# Patient Record
Sex: Male | Born: 1970 | Race: White | Hispanic: No | Marital: Single | Smoking: Former smoker
Health system: Southern US, Community
[De-identification: ages and names within clinical notes are randomized; demographics above are authoritative.]

## PROBLEM LIST (undated history)

## (undated) DIAGNOSIS — H9190 Unspecified hearing loss, unspecified ear: Secondary | ICD-10-CM

## (undated) DIAGNOSIS — G4700 Insomnia, unspecified: Secondary | ICD-10-CM

## (undated) DIAGNOSIS — J449 Chronic obstructive pulmonary disease, unspecified: Secondary | ICD-10-CM

## (undated) DIAGNOSIS — N529 Male erectile dysfunction, unspecified: Secondary | ICD-10-CM

## (undated) DIAGNOSIS — R110 Nausea: Secondary | ICD-10-CM

## (undated) DIAGNOSIS — M6289 Other specified disorders of muscle: Secondary | ICD-10-CM

## (undated) DIAGNOSIS — K5904 Chronic idiopathic constipation: Secondary | ICD-10-CM

## (undated) DIAGNOSIS — J4489 Other specified chronic obstructive pulmonary disease: Secondary | ICD-10-CM

## (undated) DIAGNOSIS — E785 Hyperlipidemia, unspecified: Secondary | ICD-10-CM

## (undated) DIAGNOSIS — R7303 Prediabetes: Secondary | ICD-10-CM

## (undated) DIAGNOSIS — J45909 Unspecified asthma, uncomplicated: Secondary | ICD-10-CM

## (undated) DIAGNOSIS — J439 Emphysema, unspecified: Secondary | ICD-10-CM

## (undated) DIAGNOSIS — K649 Unspecified hemorrhoids: Secondary | ICD-10-CM

## (undated) DIAGNOSIS — K861 Other chronic pancreatitis: Secondary | ICD-10-CM

## (undated) DIAGNOSIS — Z9284 Personal history of unintended awareness under general anesthesia: Secondary | ICD-10-CM

## (undated) DIAGNOSIS — M1990 Unspecified osteoarthritis, unspecified site: Secondary | ICD-10-CM

## (undated) DIAGNOSIS — F4310 Post-traumatic stress disorder, unspecified: Secondary | ICD-10-CM

## (undated) DIAGNOSIS — J302 Other seasonal allergic rhinitis: Secondary | ICD-10-CM

## (undated) DIAGNOSIS — K8689 Other specified diseases of pancreas: Secondary | ICD-10-CM

## (undated) DIAGNOSIS — E559 Vitamin D deficiency, unspecified: Secondary | ICD-10-CM

## (undated) HISTORY — DX: Insomnia, unspecified: G47.00

## (undated) HISTORY — DX: Nausea: R11.0

## (undated) HISTORY — DX: Unspecified hemorrhoids: K64.9

## (undated) HISTORY — DX: Chronic idiopathic constipation: K59.04

## (undated) HISTORY — PX: PANCREAS SURGERY: SHX731

## (undated) HISTORY — DX: Male erectile dysfunction, unspecified: N52.9

## (undated) HISTORY — DX: Unspecified hearing loss, unspecified ear: H91.90

## (undated) HISTORY — DX: Hyperlipidemia, unspecified: E78.5

## (undated) HISTORY — DX: Chronic obstructive pulmonary disease, unspecified: J44.9

## (undated) HISTORY — DX: Vitamin D deficiency, unspecified: E55.9

## (undated) HISTORY — DX: Other seasonal allergic rhinitis: J30.2

## (undated) HISTORY — DX: Other chronic pancreatitis: K86.1

## (undated) HISTORY — PX: EYE SURGERY: SHX253

---

## 2003-12-29 DIAGNOSIS — K859 Acute pancreatitis without necrosis or infection, unspecified: Secondary | ICD-10-CM | POA: Insufficient documentation

## 2003-12-29 DIAGNOSIS — R109 Unspecified abdominal pain: Secondary | ICD-10-CM | POA: Insufficient documentation

## 2004-10-13 ENCOUNTER — Emergency Department: Payer: Self-pay | Admitting: Unknown Physician Specialty

## 2004-10-15 ENCOUNTER — Other Ambulatory Visit: Payer: Self-pay

## 2004-10-15 ENCOUNTER — Emergency Department: Payer: Self-pay | Admitting: Emergency Medicine

## 2005-03-11 ENCOUNTER — Emergency Department: Payer: Self-pay | Admitting: Emergency Medicine

## 2005-03-17 ENCOUNTER — Emergency Department: Payer: Self-pay | Admitting: Emergency Medicine

## 2005-12-28 DIAGNOSIS — M629 Disorder of muscle, unspecified: Secondary | ICD-10-CM | POA: Insufficient documentation

## 2006-07-21 ENCOUNTER — Emergency Department: Payer: Self-pay | Admitting: Emergency Medicine

## 2006-12-22 ENCOUNTER — Emergency Department: Payer: Self-pay | Admitting: Emergency Medicine

## 2007-01-14 ENCOUNTER — Ambulatory Visit: Payer: Self-pay | Admitting: Gastroenterology

## 2007-01-21 ENCOUNTER — Ambulatory Visit: Payer: Self-pay | Admitting: Gastroenterology

## 2007-07-09 ENCOUNTER — Inpatient Hospital Stay: Payer: Self-pay | Admitting: Unknown Physician Specialty

## 2008-01-24 ENCOUNTER — Ambulatory Visit: Payer: Self-pay | Admitting: Unknown Physician Specialty

## 2008-02-18 ENCOUNTER — Emergency Department: Payer: Self-pay | Admitting: Emergency Medicine

## 2008-02-29 ENCOUNTER — Ambulatory Visit: Payer: Self-pay | Admitting: Unknown Physician Specialty

## 2008-03-24 ENCOUNTER — Emergency Department: Payer: Self-pay | Admitting: Emergency Medicine

## 2008-03-24 ENCOUNTER — Other Ambulatory Visit: Payer: Self-pay

## 2008-06-29 ENCOUNTER — Other Ambulatory Visit: Payer: Self-pay

## 2008-06-29 ENCOUNTER — Inpatient Hospital Stay: Payer: Self-pay | Admitting: Internal Medicine

## 2008-07-11 IMAGING — US ABDOMEN ULTRASOUND
1 series · 14 of 25 positions shown · non-contrast
Comparison: none

REASON FOR EXAM: Abdominal pain. Patient in [HOSPITAL]
COMMENTS:

[Series 1: abdomen ultrasound · 0.31mm/px · 14 of 80 slices shown]
[im 1/80]
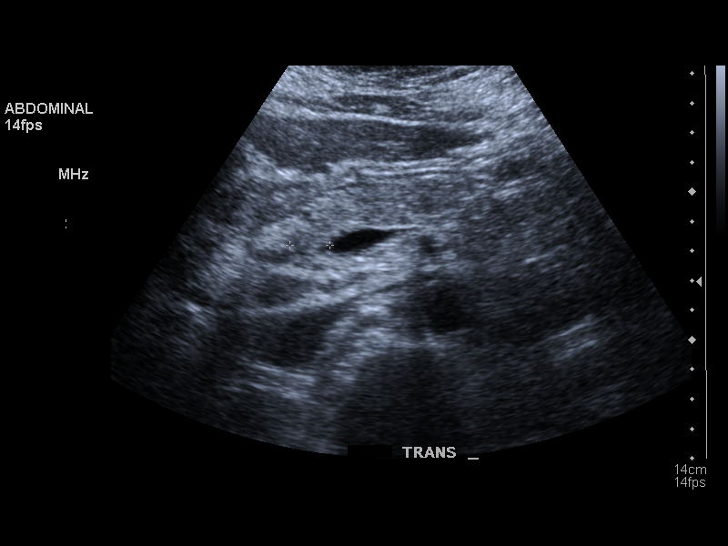
[im 7/80]
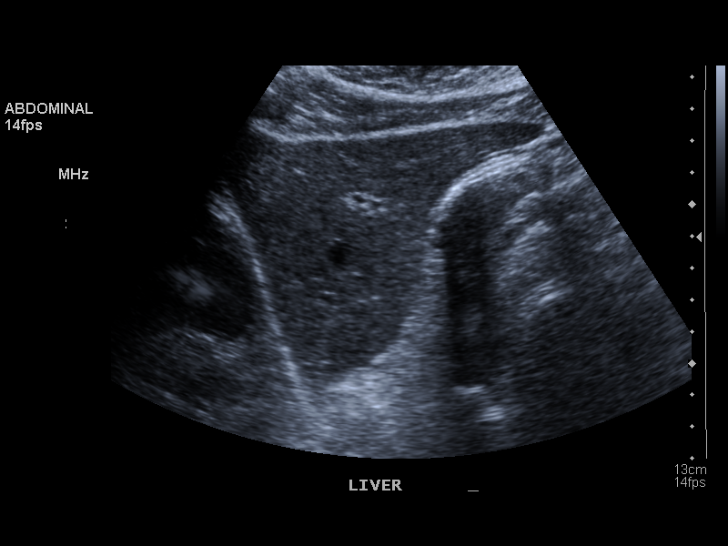
[im 14/80]
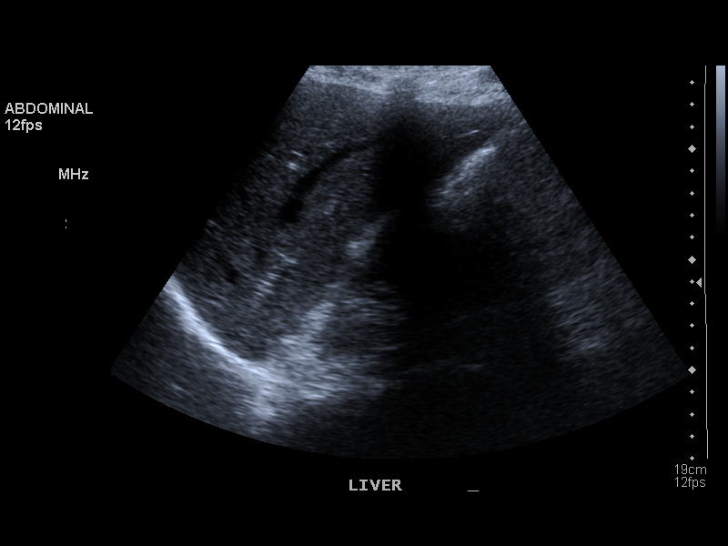
[im 20/80]
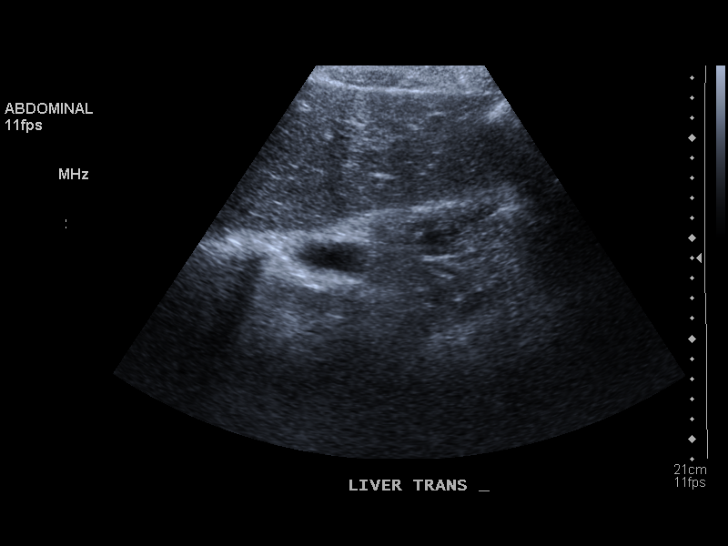
[im 27/80]
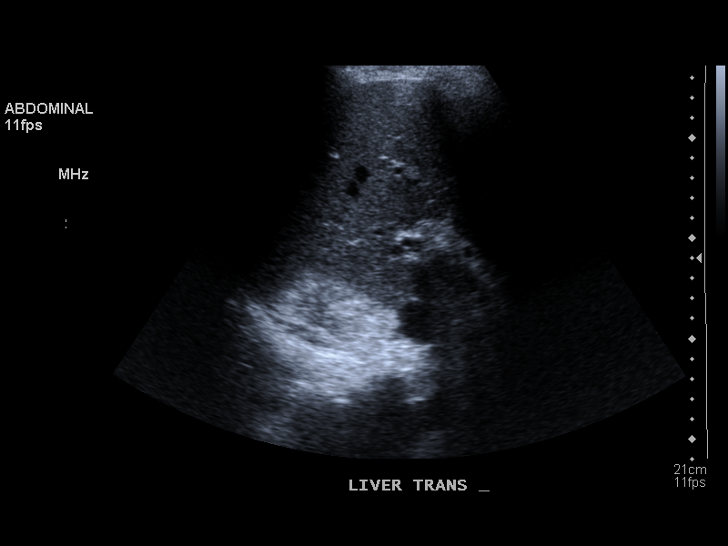
[im 30/80]
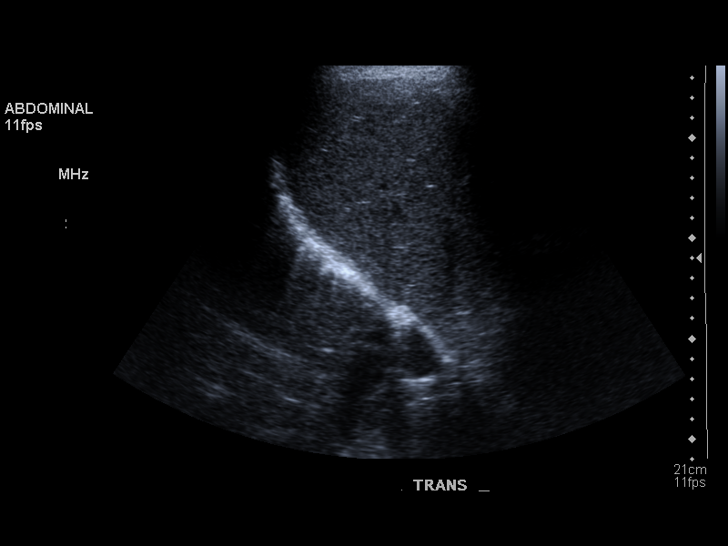
[im 37/80]
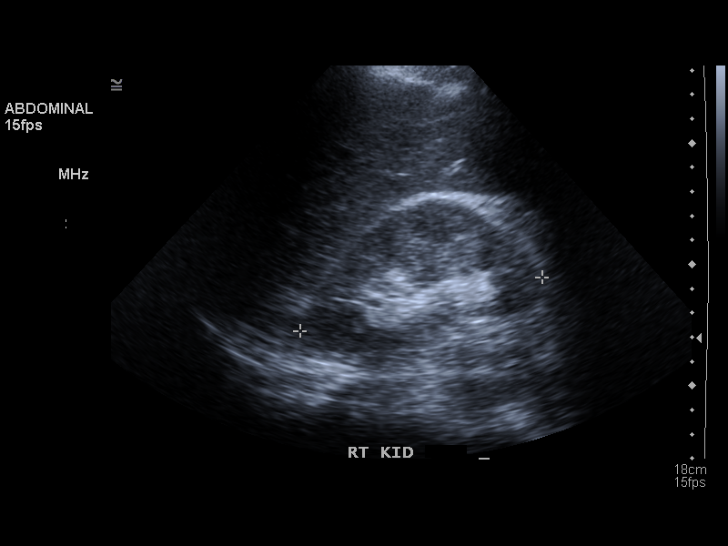
[im 43/80]
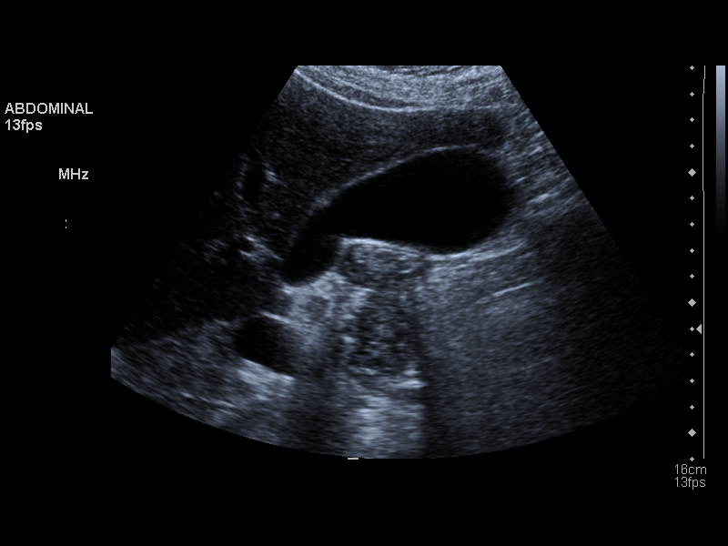
[im 50/80]
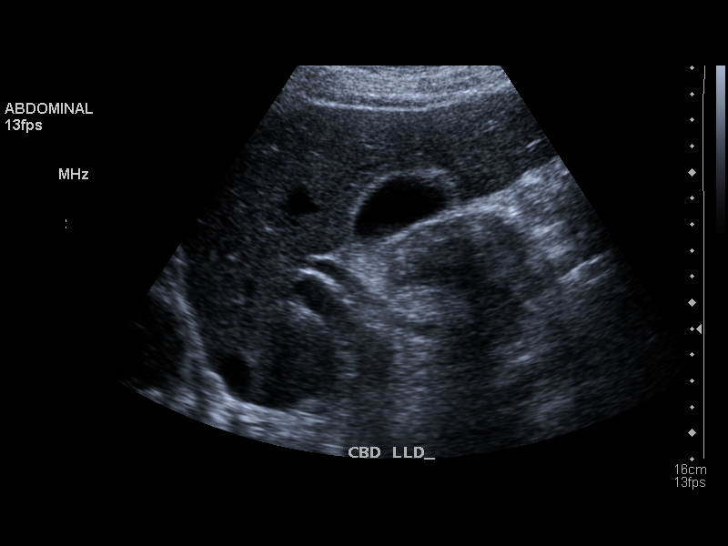
[im 53/80]
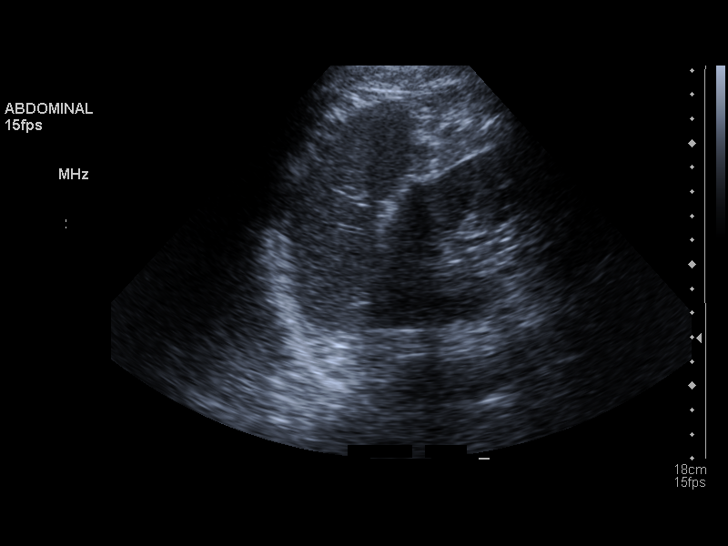
[im 60/80]
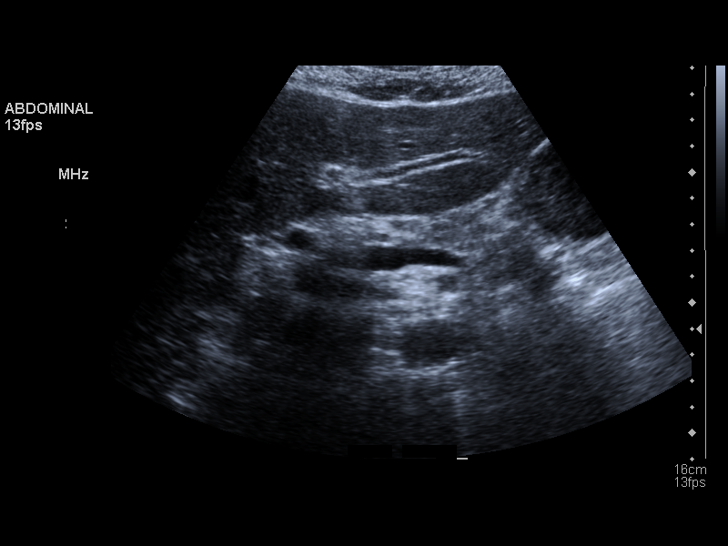
[im 66/80]
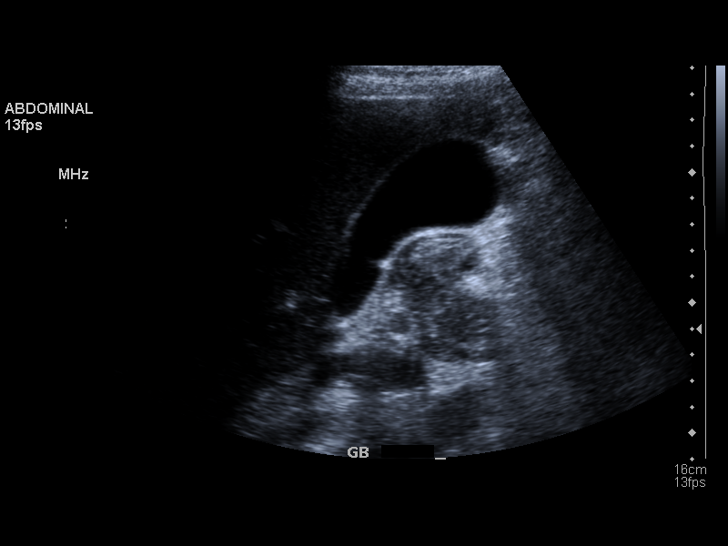
[im 73/80]
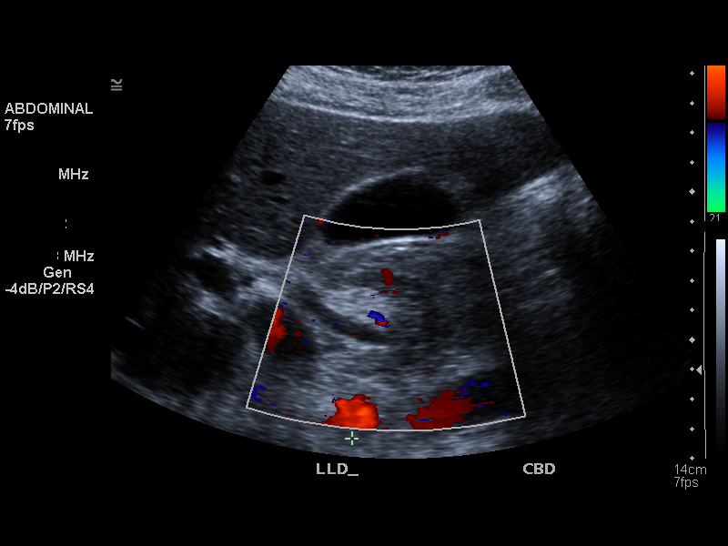
[im 80/80]
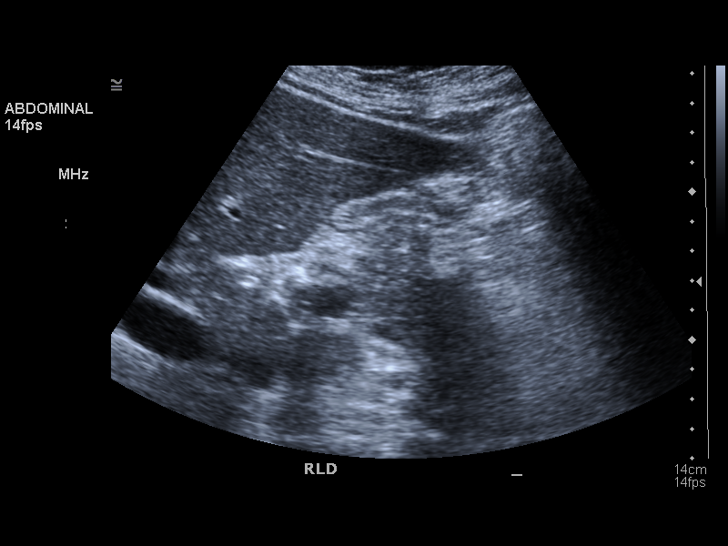

[14 of 25 positions shown; findings below may reference images not displayed]

PROCEDURE:     US  - US ABDOMEN GENERAL SURVEY  - July 21, 2006  [DATE]

RESULT:     The liver, spleen and pancreas show no specific abnormalities.
No gallstones are seen.  There is no thickening of the gallbladder wall.
The common bile duct measures 7.5 mm at maximum diameter.  This is larger
than typically seen for the patient's age.  No stone in the common duct is
seen.  Nonetheless if the patient has laboratory values suggestive of common
duct obstruction then further evaluation by biliary nuclide scan would be
recommended.  The kidneys show no hydronephrosis.  There is no free fluid
observed in the pelvis.  The abdominal aorta is normal in appearance.
IMPRESSION: Normal study except for prominence of the common duct.  If the patient has
laboratory values suggestive of common duct obstruction then further
evaluation by biliary nuclide scan might be helpful.

## 2008-07-14 ENCOUNTER — Other Ambulatory Visit: Payer: Self-pay

## 2008-07-14 ENCOUNTER — Inpatient Hospital Stay: Payer: Self-pay | Admitting: *Deleted

## 2008-11-27 ENCOUNTER — Ambulatory Visit: Payer: Self-pay | Admitting: Pain Medicine

## 2009-06-30 IMAGING — CT CT ABD-PELV W/ CM
1 of 2 series · 15 of 32 positions shown, 19 images · non-contrast
Comparison: none

REASON FOR EXAM: (1) abdominal pain; (2) abdominal pain
COMMENTS:

[Series 2: abdomen · axial · 0.70mm/px · z∈[-515,-99]mm · 15 of 58 slices shown, 19 images]
[im 3/58  soft-tissue]
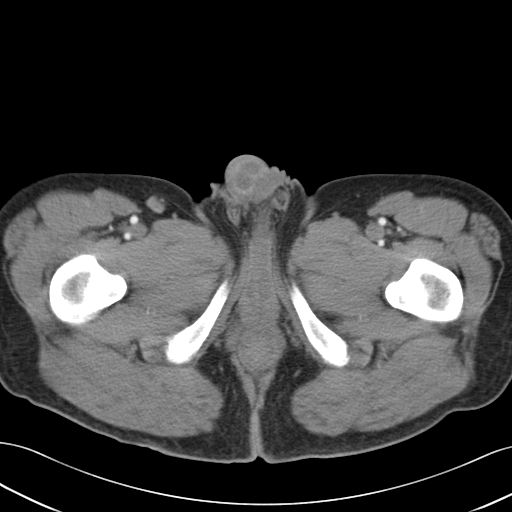
[im 3/58  bone]
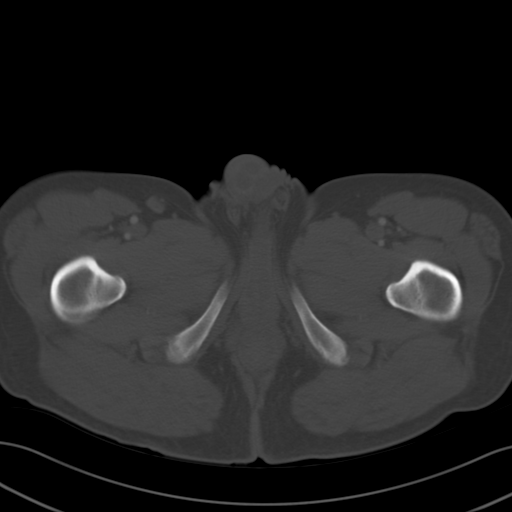
[im 7/58  soft-tissue]
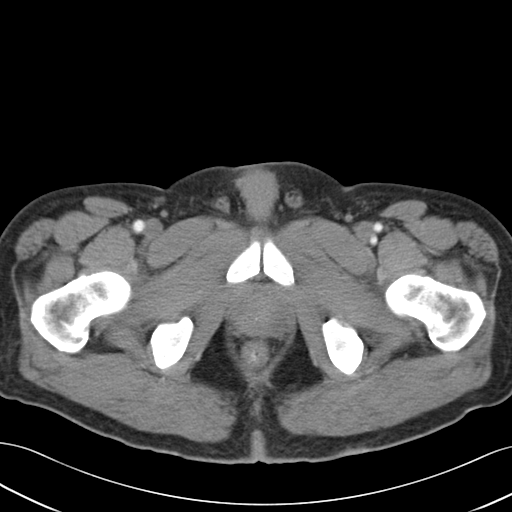
[im 11/58  soft-tissue]
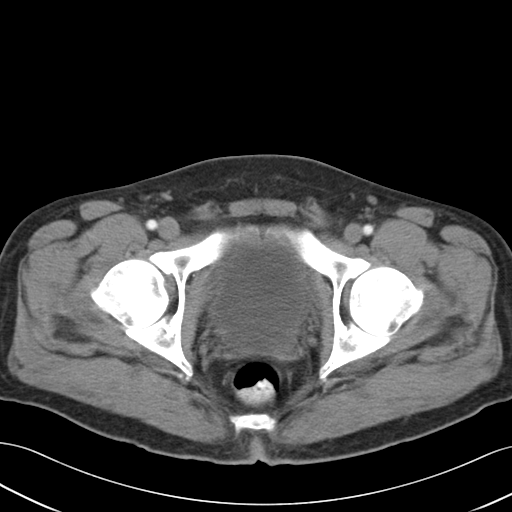
[im 16/58  soft-tissue]
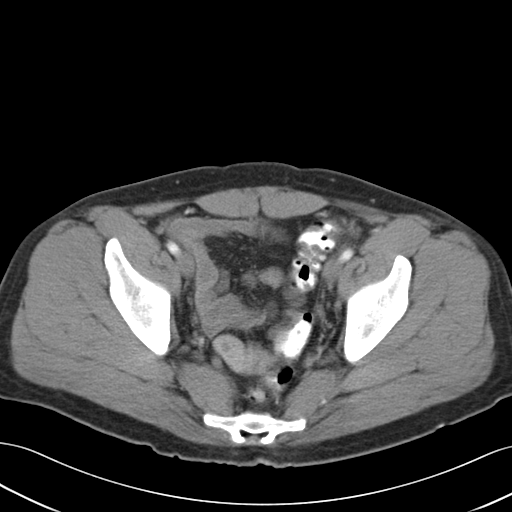
[im 20/58  soft-tissue]
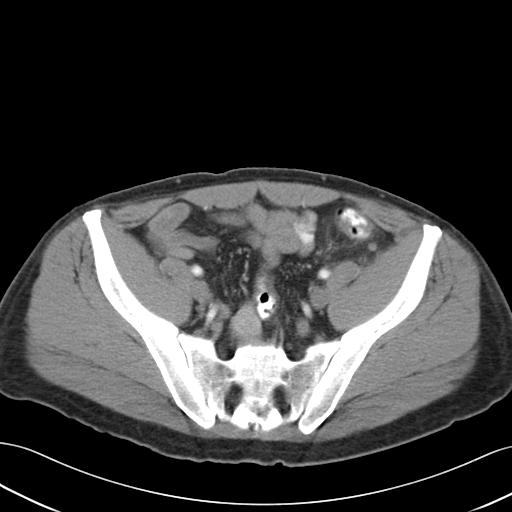
[im 25/58  soft-tissue]
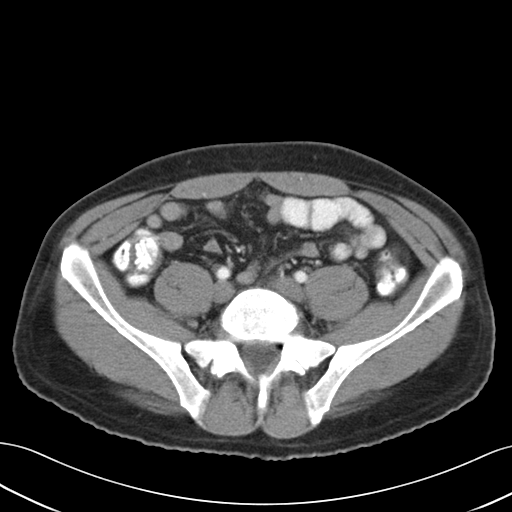
[im 29/58  soft-tissue]
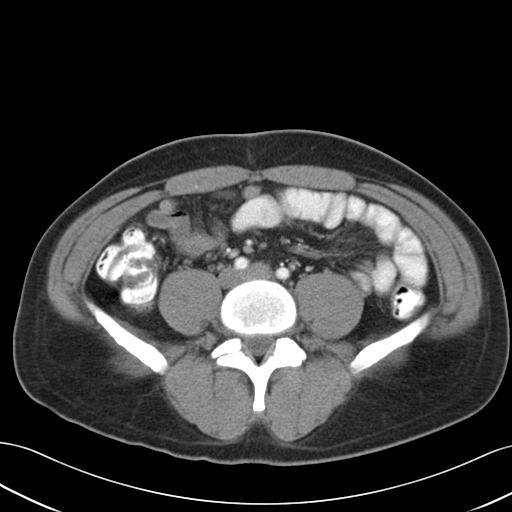
[im 33/58  soft-tissue]
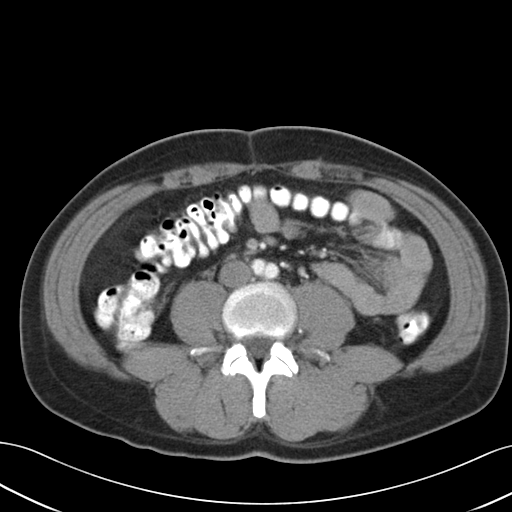
[im 38/58  soft-tissue]
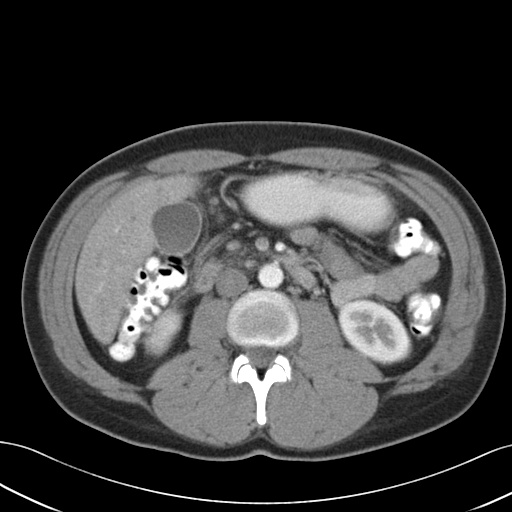
[im 38/58  bone]
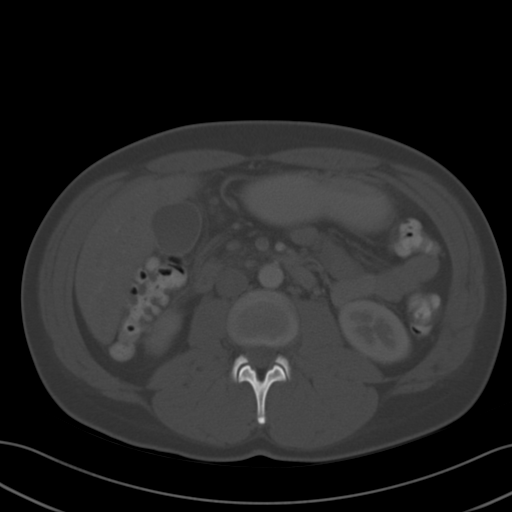
[im 42/58  soft-tissue]
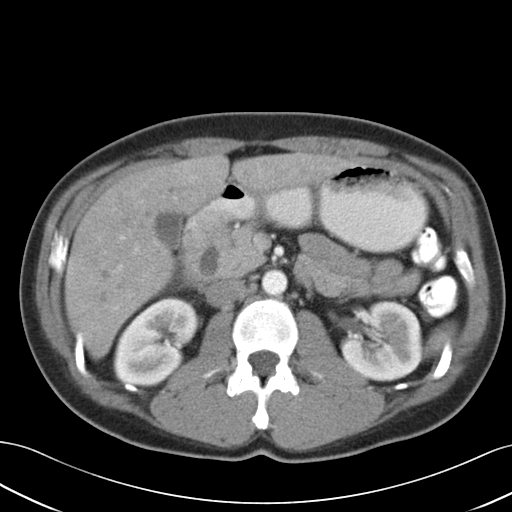
[im 47/58  soft-tissue]
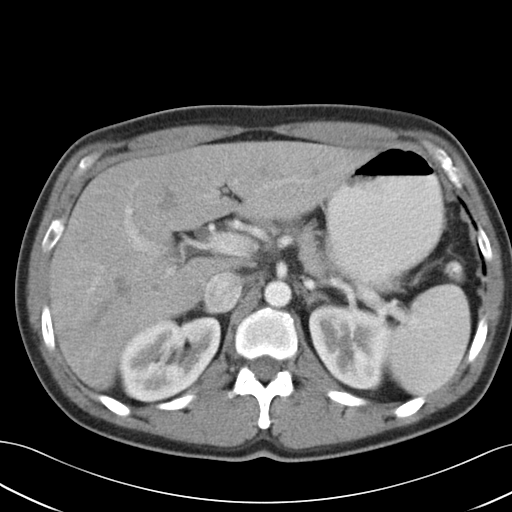
[im 49/58  lung]
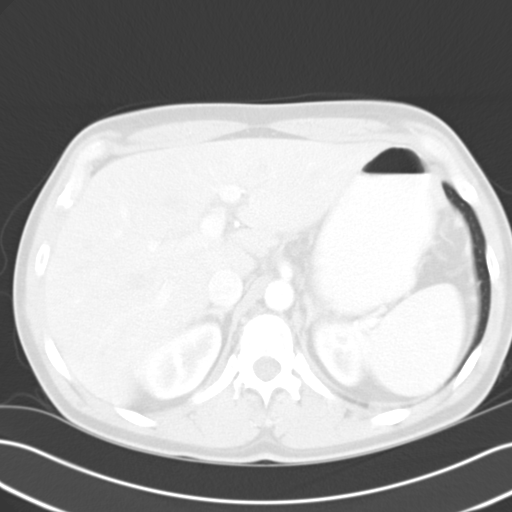
[im 51/58  soft-tissue]
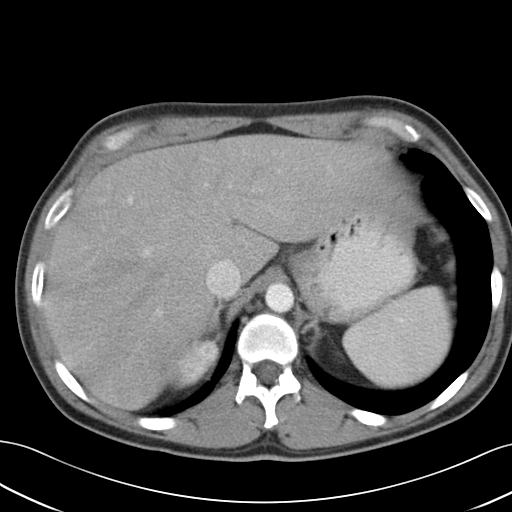
[im 51/58  lung]
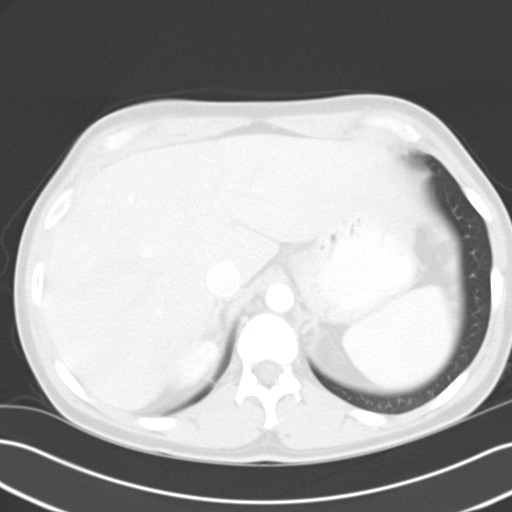
[im 53/58  lung]
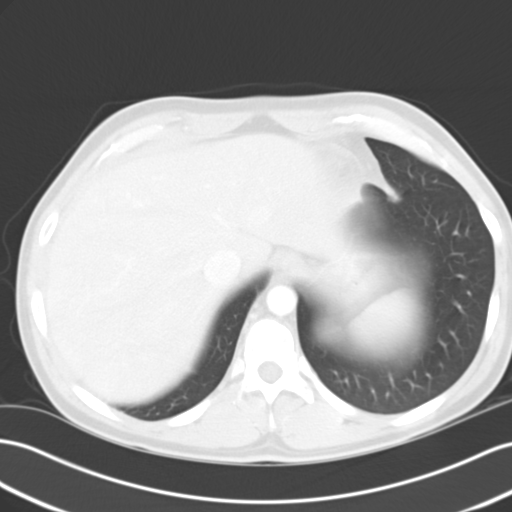
[im 55/58  soft-tissue]
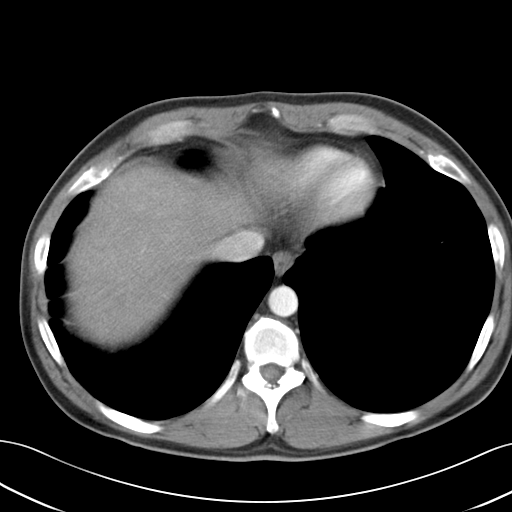
[im 55/58  lung]
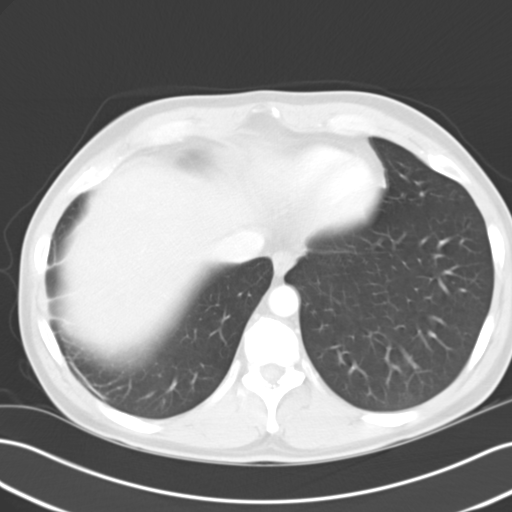

[15 of 32 positions shown; findings below may reference images not displayed]

PROCEDURE:     CT  - CT ABDOMEN / PELVIS  W  - July 10, 2007  [DATE]

RESULT:     CT of the abdomen and pelvis is performed and compared to the
prior similar study of 01/21/2007.

The appendix appears normal. There is no abnormal bowel distention or bowel
wall thickening. The liver, spleen, and kidneys appear to enhance normally.
The abdominal aorta is normal. The common bile duct diameter in the area
pancreatic head is approximately 10 mm. Correlation with LFTs is
recommended. MRCP or ERCP may be beneficial for further investigation. No
intrahepatic biliary ductal dilatation is evident. No radiopaque stones are
evident within the gallbladder. A discrete pancreatic mass is not evident.
In comparison to the prior study there does not appear to be significant
change in the appearance of the common duct in the pancreatic head.

The lung bases are clear. The heart appears to be normal in size.
IMPRESSION: 1. Prominence of the common bile duct in the pancreatic head. Consideration
for MRCP is recommended. ERCP could also be performed.
2. No evidence of appendicitis or diverticulitis.
3. No adenopathy. The kidneys show no evidence of obstruction.

## 2009-12-03 ENCOUNTER — Emergency Department: Payer: Self-pay | Admitting: Emergency Medicine

## 2013-08-18 ENCOUNTER — Ambulatory Visit: Payer: Self-pay | Admitting: Family Medicine

## 2013-09-25 ENCOUNTER — Ambulatory Visit: Payer: Self-pay | Admitting: Physician Assistant

## 2013-09-25 LAB — CBC WITH DIFFERENTIAL/PLATELET
Basophil #: 0.1 10*3/uL (ref 0.0–0.1)
Eosinophil #: 0.2 10*3/uL (ref 0.0–0.7)
Eosinophil %: 2.4 %
HCT: 47.6 % (ref 40.0–52.0)
HGB: 15.6 g/dL (ref 13.0–18.0)
Lymphocyte #: 2.6 10*3/uL (ref 1.0–3.6)
MCV: 89 fL (ref 80–100)
Monocyte #: 0.6 x10 3/mm (ref 0.2–1.0)
Monocyte %: 6.1 %
Neutrophil #: 6 10*3/uL (ref 1.4–6.5)
Neutrophil %: 63.1 %
Platelet: 264 10*3/uL (ref 150–440)
RBC: 5.34 10*6/uL (ref 4.40–5.90)
RDW: 13.9 % (ref 11.5–14.5)

## 2014-07-25 ENCOUNTER — Ambulatory Visit: Payer: Self-pay | Admitting: Physician Assistant

## 2014-12-31 ENCOUNTER — Ambulatory Visit: Payer: Self-pay | Admitting: Family Medicine

## 2015-08-31 ENCOUNTER — Ambulatory Visit
Admission: EM | Admit: 2015-08-31 | Discharge: 2015-08-31 | Disposition: A | Discharge status: Home / Self Care | Payer: Medicare Other | Attending: Internal Medicine | Admitting: Internal Medicine

## 2015-08-31 ENCOUNTER — Encounter: Payer: Self-pay | Admitting: Gynecology

## 2015-08-31 DIAGNOSIS — J019 Acute sinusitis, unspecified: Secondary | ICD-10-CM | POA: Diagnosis not present

## 2015-08-31 HISTORY — DX: Other specified disorders of muscle: M62.89

## 2015-08-31 HISTORY — DX: Other specified diseases of pancreas: K86.89

## 2015-08-31 MED ORDER — PREDNISONE 50 MG PO TABS: 50.0000 mg | ORAL_TABLET | Freq: Every day | ORAL | Status: DC

## 2015-08-31 MED ORDER — CLINDAMYCIN HCL 300 MG PO CAPS: 300.0000 mg | ORAL_CAPSULE | Freq: Two times a day (BID) | ORAL | Status: DC

## 2015-08-31 NOTE — Discharge Instructions (Signed)
Prescriptions for prednisone (a steroid) and clindamycin (an antibiotic) were sent to the [facility] in [place]. Recheck or followup with pcp/[facility] if not starting to improve in a few days. Anticipate gradual improvement in congestion/headache/cough/drainage over the next 2-3 weeks.

## 2015-08-31 NOTE — ED Notes (Signed)
Head, ear, eye, jaw, nose pain.  Lots of pressure.

## 2015-08-31 NOTE — ED Provider Notes (Signed)
CSN: [id number]     Arrival date & time 08/31/15  1006 History   First MD Initiated Contact with Patient 08/31/15 1101     Chief Complaint  Patient presents with  . Facial Pain   HPI  Patient is a 44 year old gentleman with 2 weeks history of increasing sinus congestion, particularly feeling pressure around the eyes, and across the cheeks. Nose is clogged up, having some terrific headaches. Some postnasal drainage, some runny nose, most noticeable in the morning. Using Flonase at bedtime. Having some gum discomfort. Was having some cough and chest tightness earlier in the course of illness, which responded to a couple of doses of an Alka-Seltzer product. Now in the last 3 days having worsening sinus pressure. Symptoms tend to recur in the spring and in the fall.  Past Medical History  Diagnosis Date  . Pancreas (digestive gland) works poorly   . Degenerative disorder of muscle    Past Surgical History  Procedure Laterality Date  . Pancreas surgery     History reviewed. No pertinent family history. Social History  Substance Use Topics  . Smoking status: Former Smoker    Types: Cigarettes  . Smokeless tobacco: Current User  . Alcohol Use: No    Review of Systems  All other systems reviewed and are negative.   Allergies  Penicillins and Zithromax  Home Medications   Prior to Admission medications   Medication Sig Start Date End Date Taking? Authorizing Provider  fluticasone (FLONASE) 50 MCG/ACT nasal spray Place 1 spray into both nostrils daily.   Yes Historical Provider, MD  clindamycin (CLEOCIN) 300 MG capsule Take 1 capsule (300 mg total) by mouth 2 (two) times daily. 08/31/15   Eustace Moore, MD  predniSONE (DELTASONE) 50 MG tablet Take 1 tablet (50 mg total) by mouth daily. 08/31/15   Eustace Moore, MD   Meds Ordered and Administered this Visit  Medications - No data to display  BP 114/74 mmHg  Pulse 103  Temp(Src) 98 F (36.7 C) (Oral)  Ht 5\' 11"  (1.803 m)  Wt 246  lb (111.585 kg)  BMI 34.33 kg/m2  SpO2 96% No data found.   Physical Exam  Constitutional: He is oriented to person, place, and time. No distress.  Alert, nicely groomed  HENT:  Head: Atraumatic.  Bilateral ears are little bit waxy, most pronounced on the right. TMs are moderately dull, flushed pink on the left and red on the right. Severe nasal congestion observed Throat is quite red  Eyes:  Conjugate gaze, no eye redness/drainage  Neck: Neck supple.  Cardiovascular: Normal rate and regular rhythm.   Pulmonary/Chest: No respiratory distress. He has no wheezes. He has no rales.  Lungs clear, symmetric breath sounds Breath sounds are quite diminished posteriorly  Abdominal: Soft. He exhibits no distension.  Musculoskeletal: He exhibits no edema.  Neurological: He is alert and oriented to person, place, and time.  Skin: Skin is warm and dry.  Pink. No cyanosis  Nursing note and vitals reviewed.   ED Course  Procedures none        MDM   1. Acute sinusitis, recurrence not specified, unspecified location    Discharge Medication List as of 08/31/2015 11:16 AM    START taking these medications   Details  clindamycin (CLEOCIN) 300 MG capsule Take 1 capsule (300 mg total) by mouth 2 (two) times daily., Starting 08/31/2015, Until Discontinued, Normal    predniSONE (DELTASONE) 50 MG tablet Take 1 tablet (50 mg total) by  mouth daily., Starting 08/31/2015, Until Discontinued, Normal       Recheck or follow-up PCP/[facility], if not improving in a few days. Anticipate gradual improvement in sinus pressure, headache, drainage, over the next 2-3 weeks.    Eustace Moore, MD 08/31/15 1125

## 2015-10-26 ENCOUNTER — Ambulatory Visit
Admission: EM | Admit: 2015-10-26 | Discharge: 2015-10-26 | Disposition: A | Discharge status: Home / Self Care | Payer: Medicare Other | Attending: Family Medicine | Admitting: Family Medicine

## 2015-10-26 DIAGNOSIS — K047 Periapical abscess without sinus: Secondary | ICD-10-CM

## 2015-10-26 HISTORY — DX: Unspecified asthma, uncomplicated: J45.909

## 2015-10-26 MED ORDER — CLINDAMYCIN HCL 150 MG PO CAPS: 450.0000 mg | ORAL_CAPSULE | Freq: Three times a day (TID) | ORAL | Status: AC

## 2015-10-26 NOTE — ED Notes (Signed)
Pt c/o teeth on the top and bottom of the right side of mouth hurting..states he has an apt to extract them this week but the pain is worse, states he is on OxyContin and morphine for chronic pain which is not relieving the tooth pain.

## 2015-10-26 NOTE — Discharge Instructions (Signed)
Use pain medications you have at home for pain along with warm compresses or ICE Complete all antibiotics as directed Keep appt with [facility] on Monday as planned Dental Abscess A dental abscess is a collection of pus in or around a tooth. CAUSES This condition is caused by a bacterial infection around the root of the tooth that involves the inner part of the tooth (pulp). It may result from:  Severe tooth decay.  Trauma to the tooth that allows bacteria to enter into the pulp, such as a broken or chipped tooth.  Severe gum disease around a tooth. SYMPTOMS Symptoms of this condition include:  Severe pain in and around the infected tooth.  Swelling and redness around the infected tooth, in the mouth, or in the face.  Tenderness.  Pus drainage.  Bad breath.  Bitter taste in the mouth.  Difficulty swallowing.  Difficulty opening the mouth.  Nausea.  Vomiting.  Chills.  Swollen neck glands.  Fever. DIAGNOSIS This condition is diagnosed with examination of the infected tooth. During the exam, your dentist may tap on the infected tooth. Your dentist will also ask about your medical and dental history and may order X-rays. TREATMENT This condition is treated by eliminating the infection. This may be done with:  Antibiotic medicine.  A root canal. This may be performed to save the tooth.  Pulling (extracting) the tooth. This may also involve draining the abscess. This is done if the tooth cannot be saved. HOME CARE INSTRUCTIONS  Take medicines only as directed by your dentist.  If you were prescribed antibiotic medicine, finish all of it even if you start to feel better.  Rinse your mouth (gargle) often with salt water to relieve pain or swelling.  Do not drive or operate heavy machinery while taking pain medicine.  Do not apply heat to the outside of your mouth.  Keep all follow-up visits as directed by your dentist. This is important. SEEK MEDICAL CARE  IF:  Your pain is worse and is not helped by medicine. SEEK IMMEDIATE MEDICAL CARE IF:  You have a fever or chills.  Your symptoms suddenly get worse.  You have a very bad headache.  You have problems breathing or swallowing.  You have trouble opening your mouth.  You have swelling in your neck or around your eye.   This information is not intended to replace advice given to you by your health care provider. Make sure you discuss any questions you have with your health care provider.   Document Released: 12/14/2005 Document Revised: 04/30/2015 Document Reviewed: 12/11/2014 Elsevier Interactive Patient Education [employer]2016 Elsevier Inc.

## 2015-10-26 NOTE — ED Provider Notes (Signed)
CSN: [id number]     Arrival date & time 10/26/15  1143 History   First MD Initiated Contact with Patient 10/26/15 1333     Chief Complaint  Patient presents with  . Dental Pain   (Consider location/radiation/quality/duration/timing/severity/associated sxs/prior Treatment) HPI  Past Medical History  Diagnosis Date  . Pancreas (digestive gland) works poorly (HCC)   . Degenerative disorder of muscle   . Asthma    Past Surgical History  Procedure Laterality Date  . Pancreas surgery     No family history on file. Social History  Substance Use Topics  . Smoking status: Former Smoker    Types: Cigarettes  . Smokeless tobacco: Current User  . Alcohol Use: No    Review of Systems  Constitutional: Positive for fever, chills, activity change and appetite change. Negative for diaphoresis.  HENT: Positive for dental problem, facial swelling, sinus pressure and sneezing. Negative for drooling, ear discharge, ear pain, hearing loss, mouth sores, nosebleeds, postnasal drip, rhinorrhea, sore throat, tinnitus, trouble swallowing and voice change.   Eyes: Negative.   Respiratory: Negative.   Cardiovascular: Negative.   Gastrointestinal: Negative.   Genitourinary: Negative.   Musculoskeletal: Negative.   Skin: Negative.   Neurological: Negative.   Psychiatric/Behavioral: Negative.     Allergies  Penicillins and Zithromax  Home Medications   Prior to Admission medications   Medication Sig Start Date End Date Taking? Authorizing Provider  albuterol (PROVENTIL) (2.5 MG/3ML) 0.083% nebulizer solution Take 2.5 mg by nebulization every 6 (six) hours as needed for wheezing or shortness of breath.   Yes Historical Provider, MD  Fluticasone-Salmeterol (ADVAIR) 250-50 MCG/DOSE AEPB Inhale 1 puff into the lungs 2 (two) times daily.   Yes Historical Provider, MD  HYDROmorphone (DILAUDID) 2 MG tablet Take 2 mg by mouth 3 (three) times daily as needed for severe pain.   Yes Historical Provider, MD   oxyCODONE (OXYCONTIN) 20 mg 12 hr tablet Take 20 mg by mouth every 12 (twelve) hours.   Yes Historical Provider, MD  tiotropium (SPIRIVA) 18 MCG inhalation capsule Place 18 mcg into inhaler and inhale daily.   Yes Historical Provider, MD  clindamycin (CLEOCIN) 300 MG capsule Take 1 capsule (300 mg total) by mouth 2 (two) times daily. Patient not taking: Reported on 10/26/2015 08/31/15   Eustace MooreLaura W Murray, MD  fluticasone [facility](FLONASE) 50 MCG/ACT nasal spray Place 1 spray into both nostrils daily.    Historical Provider, MD  predniSONE (DELTASONE) 50 MG tablet Take 1 tablet (50 mg total) by mouth daily. Patient not taking: Reported on 10/26/2015 08/31/15   Eustace MooreLaura W Murray, MD   Meds Ordered and Administered this Visit  Medications - No data to display  BP 123/83 mmHg  Pulse 106  Temp(Src) 97.5 F (36.4 C) (Oral)  Resp 18  Ht 6' (1.829 m)  Wt 247 lb (112.038 kg)  BMI 33.49 kg/m2  SpO2 95% No data found.   Physical Exam  Constitutional: He is oriented to person, place, and time. Vital signs are normal. He appears well-developed and well-nourished.  Non-toxic appearance. He does not have a sickly appearance. He appears distressed.  HENT:  Head: Normocephalic and atraumatic.  Right Ear: Hearing, tympanic membrane, external ear and ear canal normal.  Left Ear: Hearing, tympanic membrane, external ear and ear canal normal.  Nose: Right sinus exhibits maxillary sinus tenderness and frontal sinus tenderness. Left sinus exhibits no maxillary sinus tenderness and no frontal sinus tenderness.  Mouth/Throat: Uvula is midline and oropharynx is clear and moist.  Abnormal dentition. Dental abscesses and dental caries present. No uvula swelling.    Eyes: Conjunctivae are normal. Pupils are equal, round, and reactive to light. No scleral icterus.  Neck: Normal range of motion. Neck supple. No thyromegaly present.  Cardiovascular: Normal rate and regular rhythm.   Pulmonary/Chest: Effort normal and breath  sounds normal. No respiratory distress. He has no wheezes.  Abdominal: Soft. Bowel sounds are normal. He exhibits no distension and no mass. There is no tenderness. There is no rebound and no guarding.  Musculoskeletal: Normal range of motion. He exhibits no edema or tenderness.  Lymphadenopathy:    He has no cervical adenopathy.  Neurological: He is alert and oriented to person, place, and time. No cranial nerve deficit.  Skin: Skin is warm and dry. No rash noted. No erythema.  Psychiatric: His speech is normal. Thought content normal. His affect is angry and blunt. He is agitated.  He states he is in pain.  Nursing note and vitals reviewed.   ED Course  Procedures (including critical care time)   MDM   1. Dental abscess    Plan: Rx as per orders;  benefits, risks, potential side effects reviewed  Recommend supportive treatment with rest, ice or heat He agrees to use pain medications he has already to control pain Followup with Dentist Monday as planned Seek additional medical care if symptoms worsen or are not improving     Joselyn Arrow, NP 10/26/15 1406

## 2016-01-25 ENCOUNTER — Ambulatory Visit: Payer: Medicare Other

## 2016-01-25 ENCOUNTER — Ambulatory Visit
Admission: EM | Admit: 2016-01-25 | Discharge: 2016-01-25 | Disposition: A | Discharge status: Home / Self Care | Payer: Medicare Other | Attending: Family Medicine | Admitting: Family Medicine

## 2016-01-25 ENCOUNTER — Encounter: Payer: Self-pay | Admitting: Gynecology

## 2016-01-25 DIAGNOSIS — Z880 Allergy status to penicillin: Secondary | ICD-10-CM | POA: Insufficient documentation

## 2016-01-25 DIAGNOSIS — R05 Cough: Secondary | ICD-10-CM | POA: Diagnosis present

## 2016-01-25 DIAGNOSIS — J069 Acute upper respiratory infection, unspecified: Secondary | ICD-10-CM | POA: Diagnosis not present

## 2016-01-25 DIAGNOSIS — J40 Bronchitis, not specified as acute or chronic: Secondary | ICD-10-CM | POA: Insufficient documentation

## 2016-01-25 MED ORDER — BENZONATATE 200 MG PO CAPS: 200.0000 mg | ORAL_CAPSULE | Freq: Three times a day (TID) | ORAL | Status: DC | PRN

## 2016-01-25 MED ORDER — PREDNISONE 10 MG (21) PO TBPK: ORAL_TABLET | ORAL | Status: DC

## 2016-01-25 MED ORDER — CEFUROXIME AXETIL 500 MG PO TABS: 500.0000 mg | ORAL_TABLET | Freq: Two times a day (BID) | ORAL | Status: DC

## 2016-01-25 NOTE — ED Provider Notes (Signed)
CSN: [id number]     Arrival date & time 01/25/16  5621 History   First MD Initiated Contact with Patient 01/25/16 [phone]    Nurses notes were reviewed. Chief Complaint  Patient presents with  . Cough   Patient is here for cough and congestion. He states about a month he felt like he was trying to come down some. He is a 89 over-the-counter cough medicine has not helped. Last week the cough and congestion is gotten worse with now pain in his left lower chest is seems to go to his back and feels that something is caught there when he tries to cough. He has had pneumonia before and states that this felt like the last time. Should be noted the patient is not the best historian but he does reveal that he has chronic pancreatitis he takes pain medication for that his degenerative disorder of his muscles yes muscle weakness as well. He was a smoker he used to be a smoker and his mother does have lung cancer.  He denies any fever sore throat and states that the cough is nonproductive but he feels like he cannot bring up anything and as part problem that he is having.  (Consider location/radiation/quality/duration/timing/severity/associated sxs/prior Treatment) Patient is a 45 y.o. male presenting with cough. The history is provided by the patient. No language interpreter was used.  Cough Cough characteristics:  Non-productive Severity:  Moderate Onset quality:  Gradual Duration:  4 weeks Timing:  Constant Progression:  Worsening Chronicity:  New Smoker: no   Context: smoke exposure and upper respiratory infection   Relieved by:  Nothing Ineffective treatments:  Beta-agonist inhaler and cough suppressants Associated symptoms: chest pain, shortness of breath and wheezing   Associated symptoms: no rash, no rhinorrhea, no sinus congestion and no sore throat     Past Medical History  Diagnosis Date  . Pancreas (digestive gland) works poorly (HCC)   . Degenerative disorder of muscle   . Asthma     Past Surgical History  Procedure Laterality Date  . Pancreas surgery     No family history on file. Social History  Substance Use Topics  . Smoking status: Former Smoker    Types: Cigarettes  . Smokeless tobacco: Current User  . Alcohol Use: No    Review of Systems  HENT: Negative for rhinorrhea and sore throat.   Respiratory: Positive for cough, shortness of breath and wheezing.   Cardiovascular: Positive for chest pain.  Skin: Negative for rash.  All other systems reviewed and are negative.   Allergies  Penicillins and Zithromax  Home Medications   Prior to Admission medications   Medication Sig Start Date End Date Taking? Authorizing Provider  albuterol (PROVENTIL) (2.5 MG/3ML) 0.083% nebulizer solution Take 2.5 mg by nebulization every 6 (six) hours as needed for wheezing or shortness of breath.   Yes Historical Provider, MD  fluticasone (FLONASE) 50 MCG/ACT nasal spray Place 1 spray into both nostrils daily.   Yes Historical Provider, MD  Fluticasone-Salmeterol (ADVAIR) 250-50 MCG/DOSE AEPB Inhale 1 puff into the lungs 2 (two) times daily.   Yes Historical Provider, MD  HYDROmorphone (DILAUDID) 2 MG tablet Take 2 mg by mouth 3 (three) times daily as needed for severe pain.   Yes Historical Provider, MD  oxyCODONE (OXYCONTIN) 20 mg 12 hr tablet Take 20 mg by mouth every 12 (twelve) hours.   Yes Historical Provider, MD  tiotropium (SPIRIVA) 18 MCG inhalation capsule Place 18 mcg into inhaler and inhale daily.  Yes Historical Provider, MD  clindamycin (CLEOCIN) 300 MG capsule Take 1 capsule (300 mg total) by mouth 2 (two) times daily. 08/31/15   Eustace Moore, MD  predniSONE (DELTASONE) 50 MG tablet Take 1 tablet (50 mg total) by mouth daily. 08/31/15   Eustace Moore, MD   Meds Ordered and Administered this Visit  Medications - No data to display  BP 107/68 mmHg  Pulse 109  Temp(Src) 98.3 F (36.8 C) (Oral)  Resp 18  Ht 6' (1.829 m)  Wt 243 lb (110.224 kg)  BMI  32.95 kg/m2  SpO2 95% No data found.   Physical Exam  Constitutional: He is oriented to person, place, and time. He appears well-developed.  HENT:  Head: Normocephalic and atraumatic.  Right Ear: External ear normal.  Left Ear: External ear normal.  Nose: Mucosal edema present. Right sinus exhibits no maxillary sinus tenderness. Left sinus exhibits no maxillary sinus tenderness.  Mouth/Throat: Uvula is midline. Posterior oropharyngeal erythema present.  Eyes: Conjunctivae are normal. Pupils are equal, round, and reactive to light.  Neck: Normal range of motion. Neck supple.  Cardiovascular: Normal rate.   Pulmonary/Chest: Effort normal. No respiratory distress. He has decreased breath sounds.  Musculoskeletal: Normal range of motion.  Lymphadenopathy:    He has cervical adenopathy.  Neurological: He is alert and oriented to person, place, and time.  Skin: Skin is warm and dry. No rash noted. No erythema.  Psychiatric: His mood appears anxious.  Vitals reviewed.   ED Course  Procedures (including critical care time)  Labs Review Labs Reviewed - No data to display  Imaging Review Dg Chest 2 View  01/25/2016  CLINICAL DATA:  Left upper chest discomfort.  Cold for 4 weeks. EXAM: CHEST  2 VIEW COMPARISON:  07/25/2014 FINDINGS: Stable mild elevation of the left hemidiaphragm. Heart and mediastinal contours are within normal limits. No focal opacities or effusions. No acute bony abnormality. IMPRESSION: No active cardiopulmonary disease. Electronically Signed   By: Charlett Nose M.D.   On: 01/25/2016 09:37     Visual Acuity Review  Right Eye Distance:   Left Eye Distance:   Bilateral Distance:    Right Eye Near:   Left Eye Near:    Bilateral Near:         MDM   1. Bronchitis   2. URI, acute    We'll place patient on 6 day course of prednisone decreasing doses. Place on Ceftin 500 mg 1 tablet twice a day since chest x-ray was negative for pneumonia. And will also place  patient on Tessalon Perles cough but Warned that insurance may not cover it. Recommend follow-up PCP of choice in about 1-2 weeks if not better.   Note: This dictation was prepared with Dragon dictation along with smaller phrase technology. Any transcriptional errors that result from this process are unintentional.    Hassan Rowan, MD 01/25/16 1011

## 2016-01-25 NOTE — Discharge Instructions (Signed)

## 2016-01-25 NOTE — ED Notes (Signed)
Patient c/o dry cough / chest discomfort x 1 month.

## 2016-03-25 ENCOUNTER — Encounter: Payer: Self-pay | Admitting: Emergency Medicine

## 2016-03-25 ENCOUNTER — Ambulatory Visit
Admission: EM | Admit: 2016-03-25 | Discharge: 2016-03-25 | Disposition: A | Discharge status: Home / Self Care | Payer: Medicare Other | Attending: Family Medicine | Admitting: Family Medicine

## 2016-03-25 ENCOUNTER — Ambulatory Visit: Payer: Medicare Other

## 2016-03-25 DIAGNOSIS — Z87891 Personal history of nicotine dependence: Secondary | ICD-10-CM | POA: Diagnosis not present

## 2016-03-25 DIAGNOSIS — J209 Acute bronchitis, unspecified: Secondary | ICD-10-CM | POA: Insufficient documentation

## 2016-03-25 DIAGNOSIS — R05 Cough: Secondary | ICD-10-CM | POA: Diagnosis present

## 2016-03-25 DIAGNOSIS — Z880 Allergy status to penicillin: Secondary | ICD-10-CM | POA: Insufficient documentation

## 2016-03-25 DIAGNOSIS — J45909 Unspecified asthma, uncomplicated: Secondary | ICD-10-CM | POA: Diagnosis not present

## 2016-03-25 MED ORDER — LEVOFLOXACIN 750 MG PO TABS: 750.0000 mg | ORAL_TABLET | Freq: Every day | ORAL | Status: DC

## 2016-03-25 MED ORDER — PREDNISONE 20 MG PO TABS: 40.0000 mg | ORAL_TABLET | Freq: Every day | ORAL | Status: DC

## 2016-03-25 MED ORDER — BENZONATATE 200 MG PO CAPS: 200.0000 mg | ORAL_CAPSULE | Freq: Three times a day (TID) | ORAL | Status: DC | PRN

## 2016-03-25 MED ORDER — DOXYCYCLINE HYCLATE 100 MG PO CAPS: 100.0000 mg | ORAL_CAPSULE | Freq: Two times a day (BID) | ORAL | Status: DC

## 2016-03-25 NOTE — Discharge Instructions (Signed)
Acute Bronchitis Bronchitis is when the airways that extend from the windpipe into the lungs get red, puffy, and painful (inflamed). Bronchitis often causes thick spit (mucus) to develop. This leads to a cough. A cough is the most common symptom of bronchitis. In acute bronchitis, the condition usually begins suddenly and goes away over time (usually in 2 weeks). Smoking, allergies, and asthma can make bronchitis worse. Repeated episodes of bronchitis may cause more lung problems. HOME CARE  Rest.  Drink enough fluids to keep your pee (urine) clear or pale yellow (unless you need to limit fluids as told by your doctor).  Only take over-the-counter or prescription medicines as told by your doctor.  Avoid smoking and secondhand smoke. These can make bronchitis worse. If you are a smoker, think about using nicotine gum or skin patches. Quitting smoking will help your lungs heal faster.  Reduce the chance of getting bronchitis again by:  Washing your hands often.  Avoiding people with cold symptoms.  Trying not to touch your hands to your mouth, nose, or eyes.  Follow up with your doctor as told. GET HELP IF: Your symptoms do not improve after 1 week of treatment. Symptoms include:  Cough.  Fever.  Coughing up thick spit.  Body aches.  Chest congestion.  Chills.  Shortness of breath.  Sore throat. GET HELP RIGHT AWAY IF:   You have an increased fever.  You have chills.  You have severe shortness of breath.  You have bloody thick spit (sputum).  You throw up (vomit) often.  You lose too much body fluid (dehydration).  You have a severe headache.  You faint. MAKE SURE YOU:   Understand these instructions.  Will watch your condition.  Will get help right away if you are not doing well or get worse.   This information is not intended to replace advice given to you by your health care provider. Make sure you discuss any questions you have with your health care  provider. Your CXR was negative. Rest,push fluids, take meds as directed(doxyxcycline, prednisone, tessalon as directed). Follow up with your PCP in 2 days([facility]) for recheck if not improving.      Document Released: 06/01/2008 Document Revised: 08/16/2013 Document Reviewed: 06/06/2013 Elsevier Interactive Patient Education [employer].

## 2016-03-25 NOTE — ED Provider Notes (Signed)
CSN: [id number]     Arrival date & time 03/25/16  [id number] History   First MD Initiated Contact with Patient 03/25/16 0900     Chief Complaint  Patient presents with  . Cough   (Consider location/radiation/quality/duration/timing/severity/associated sxs/prior Treatment) Patient is a 45 y.o. male presenting with URI. The history is provided by the patient. No language interpreter was used.  URI Presenting symptoms: congestion, cough, fatigue and sore throat   Presenting symptoms: no fever   Severity:  Moderate Onset quality:  Gradual Duration: several days. Timing:  Constant Progression:  Unchanged Chronicity:  Recurrent Relieved by:  Nothing Worsened by:  Movement (coughing) Ineffective treatments:  OTC medications and rest Associated symptoms: myalgias and sinus pain   Associated symptoms: no wheezing   Risk factors: sick contacts     Past Medical History  Diagnosis Date  . Pancreas (digestive gland) works poorly (HCC)   . Degenerative disorder of muscle   . Asthma    Past Surgical History  Procedure Laterality Date  . Pancreas surgery     History reviewed. No pertinent family history. Social History  Substance Use Topics  . Smoking status: Former Smoker    Types: Cigarettes  . Smokeless tobacco: Current User  . Alcohol Use: No    Review of Systems  Constitutional: Positive for activity change and fatigue. Negative for fever.  HENT: Positive for congestion, sinus pressure and sore throat. Negative for voice change.   Eyes: Negative.   Respiratory: Positive for cough. Negative for wheezing.   Cardiovascular: Negative for chest pain, palpitations and leg swelling.  Gastrointestinal: Negative for nausea, vomiting, abdominal pain and diarrhea.  Endocrine: Negative.   Genitourinary: Negative.   Musculoskeletal: Positive for myalgias.  Skin: Negative for rash.  Allergic/Immunologic: Negative.   Hematological: Negative.   Psychiatric/Behavioral: Negative.   All other  systems reviewed and are negative.   Allergies  Penicillins and Zithromax  Home Medications   Prior to Admission medications   Medication Sig Start Date End Date Taking? Authorizing Provider  albuterol (PROVENTIL) (2.5 MG/3ML) 0.083% nebulizer solution Take 2.5 mg by nebulization every 6 (six) hours as needed for wheezing or shortness of breath.    Historical Provider, MD  benzonatate (TESSALON) 200 MG capsule Take 1 capsule (200 mg total) by mouth 3 (three) times daily as needed for cough. 03/25/16   Tessla Spurling, NP  fluticasone (FLONASE) 50 MCG/ACT nasal spray Place 1 spray into both nostrils daily.    Historical Provider, MD  Fluticasone-Salmeterol (ADVAIR) 250-50 MCG/DOSE AEPB Inhale 1 puff into the lungs 2 (two) times daily.    Historical Provider, MD  HYDROmorphone (DILAUDID) 2 MG tablet Take 2 mg by mouth 3 (three) times daily as needed for severe pain.    Historical Provider, MD  levofloxacin (LEVAQUIN) 750 MG tablet Take 1 tablet (750 mg total) by mouth daily. 03/25/16   Clancy GourdJeanette Malik Paar, NP  oxyCODONE (OXYCONTIN) 20 mg 12 hr tablet Take 20 mg by mouth every 12 (twelve) hours.    Historical Provider, MD  predniSONE (DELTASONE) 20 MG tablet Take 2 tablets (40 mg total) by mouth daily with breakfast. 03/25/16   Clancy GourdJeanette Akaash Vandewater, NP  tiotropium (SPIRIVA) 18 MCG inhalation capsule Place 18 mcg into inhaler and inhale daily.    Historical Provider, MD   Meds Ordered and Administered this Visit  Medications - No data to display  BP 114/72 mmHg  Pulse 100  Temp(Src) 97.6 F (36.4 C) (Tympanic)  Resp 17  Ht 6' (1.829 m)  Wt 243 lb (110.224 kg)  BMI 32.95 kg/m2  SpO2 96% No data found.   Physical Exam  Constitutional: He is oriented to person, place, and time. He appears well-developed and well-nourished. He is active and cooperative.  Non-toxic appearance. He does not have a sickly appearance. He does not appear ill. No distress.  HENT:  Head: Normocephalic.  Right Ear:  Tympanic membrane is retracted.  Left Ear: Tympanic membrane is retracted.  Nose: Mucosal edema present.  Mouth/Throat: Uvula is midline, oropharynx is clear and moist and mucous membranes are normal.  Eyes: Conjunctivae, EOM and lids are normal. Pupils are equal, round, and reactive to light.  Neck: Normal range of motion.  Cardiovascular: Normal rate, regular rhythm and normal heart sounds.   Pulmonary/Chest: Effort normal and breath sounds normal. No respiratory distress. He has no decreased breath sounds. He has no wheezes.  Abdominal: Soft. He exhibits no distension.  Musculoskeletal: Normal range of motion.  Neurological: He is alert and oriented to person, place, and time. No cranial nerve deficit or sensory deficit. GCS eye subscore is 4. GCS verbal subscore is 5. GCS motor subscore is 6.  Skin: Skin is warm, dry and intact. No rash noted.  Psychiatric: He has a normal mood and affect. His speech is normal and behavior is normal.  Nursing note and vitals reviewed.   ED Course  Procedures (including critical care time)  Labs Review Labs Reviewed - No data to display  Imaging Review Dg Chest 2 View  03/25/2016  CLINICAL DATA:  45 year old male with history of productive cough with chills and chest soreness all over. Shortness of breath even at rest for the past 3 weeks. EXAM: CHEST  2 VIEW COMPARISON:  Chest x-ray 01/25/2016. FINDINGS: Persistent elevation of left hemidiaphragm. Lung volumes are low. No consolidative airspace disease. No pleural effusions. No pneumothorax. No pulmonary nodule or mass noted. Pulmonary vasculature and the cardiomediastinal silhouette are within normal limits. IMPRESSION: 1. Low lung volumes without radiographic evidence of acute cardiopulmonary disease. 2. Persistent elevation of left hemidiaphragm. Electronically Signed   By: Trudie Reed M.D.   On: 03/25/2016 09:54      MDM   1. Bronchitis, acute, with bronchospasm     0912: CXR ordered.    Reviewed xray results with pt: Discussed plan of care with pt: Rest,push fluids. Pt verbalized understanding to this provider. Your CXR was negative. Rest,push fluids, take meds as directed(doxyxcycline, prednisone, tessalon as directed). Follow up with your PCP in 2 days([facility]) for recheck if not improving. Pt verbalized understanding to this provider.  Upon discharge pt states doxycycline doesn't work for him, will change to Levaquin and submit script to pharmacy.   Clancy Gourd, NP 03/25/16 1030

## 2016-03-25 NOTE — ED Notes (Signed)
Patient cough and chest congestion for 3 weeks.  Patient denies fevers.

## 2016-06-09 ENCOUNTER — Emergency Department: Payer: Medicare Other

## 2016-06-09 ENCOUNTER — Emergency Department
Admission: EM | Admit: 2016-06-09 | Discharge: 2016-06-10 | Disposition: A | Discharge status: Home / Self Care | Payer: Medicare Other | Attending: Emergency Medicine | Admitting: Emergency Medicine

## 2016-06-09 DIAGNOSIS — Z7951 Long term (current) use of inhaled steroids: Secondary | ICD-10-CM | POA: Diagnosis not present

## 2016-06-09 DIAGNOSIS — Z7952 Long term (current) use of systemic steroids: Secondary | ICD-10-CM | POA: Insufficient documentation

## 2016-06-09 DIAGNOSIS — J40 Bronchitis, not specified as acute or chronic: Secondary | ICD-10-CM | POA: Diagnosis not present

## 2016-06-09 DIAGNOSIS — R05 Cough: Secondary | ICD-10-CM | POA: Diagnosis present

## 2016-06-09 DIAGNOSIS — R059 Cough, unspecified: Secondary | ICD-10-CM

## 2016-06-09 DIAGNOSIS — Z79899 Other long term (current) drug therapy: Secondary | ICD-10-CM | POA: Insufficient documentation

## 2016-06-09 DIAGNOSIS — Z792 Long term (current) use of antibiotics: Secondary | ICD-10-CM | POA: Diagnosis not present

## 2016-06-09 DIAGNOSIS — Z79891 Long term (current) use of opiate analgesic: Secondary | ICD-10-CM | POA: Diagnosis not present

## 2016-06-09 DIAGNOSIS — Z87891 Personal history of nicotine dependence: Secondary | ICD-10-CM | POA: Diagnosis not present

## 2016-06-09 NOTE — ED Notes (Signed)
Pt in with co shob and cough for weeks states hx of pneumonia.
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
## 2016-06-10 DIAGNOSIS — J40 Bronchitis, not specified as acute or chronic: Secondary | ICD-10-CM | POA: Diagnosis not present

## 2016-06-10 MED ORDER — DOXYCYCLINE HYCLATE 50 MG PO CAPS: 100.0000 mg | ORAL_CAPSULE | Freq: Two times a day (BID) | ORAL | Status: DC

## 2016-06-10 MED ORDER — DOXYCYCLINE HYCLATE 100 MG PO TABS
100.0000 mg | ORAL_TABLET | Freq: Once | ORAL | Status: AC
  Administered 2016-06-10: 100 mg via ORAL
  Filled 2016-06-10: qty 1

## 2016-06-10 NOTE — Discharge Instructions (Signed)
1. Take antibiotic as prescribed (doxycycline 100 mg twice daily 7 days). 2. Return to the ER for worsening symptoms, persistent vomiting, difficulty breathing or other concerns.  Cough, Adult Coughing is a reflex that clears your throat and your airways. Coughing helps to heal and protect your lungs. It is normal to cough occasionally, but a cough that happens with other symptoms or lasts a long time may be a sign of a condition that needs treatment. A cough may last only 2-3 weeks (acute), or it may last longer than 8 weeks (chronic). CAUSES Coughing is commonly caused by:  Breathing in substances that irritate your lungs.  A viral or bacterial respiratory infection.  Allergies.  Asthma.  Postnasal drip.  Smoking.  Acid backing up from the stomach into the esophagus (gastroesophageal reflux).  Certain medicines.  Chronic lung problems, including COPD (or rarely, lung cancer).  Other medical conditions such as heart failure. HOME CARE INSTRUCTIONS  Pay attention to any changes in your symptoms. Take these actions to help with your discomfort:  Take medicines only as told by your health care provider.  If you were prescribed an antibiotic medicine, take it as told by your health care provider. Do not stop taking the antibiotic even if you start to feel better.  Talk with your health care provider before you take a cough suppressant medicine.  Drink enough fluid to keep your urine clear or pale yellow.  If the air is dry, use a cold steam vaporizer or humidifier in your bedroom or your home to help loosen secretions.  Avoid anything that causes you to cough at work or at home.  If your cough is worse at night, try sleeping in a semi-upright position.  Avoid cigarette smoke. If you smoke, quit smoking. If you need help quitting, ask your health care provider.  Avoid caffeine.  Avoid alcohol.  Rest as needed. SEEK MEDICAL CARE IF:   You have new symptoms.  You  cough up pus.  Your cough does not get better after 2-3 weeks, or your cough gets worse.  You cannot control your cough with suppressant medicines and you are losing sleep.  You develop pain that is getting worse or pain that is not controlled with pain medicines.  You have a fever.  You have unexplained weight loss.  You have night sweats. SEEK IMMEDIATE MEDICAL CARE IF:  You cough up blood.  You have difficulty breathing.  Your heartbeat is very fast.   This information is not intended to replace advice given to you by your health care provider. Make sure you discuss any questions you have with your health care provider.   Document Released: 06/12/2011 Document Revised: 09/04/2015 Document Reviewed: 02/20/2015 Elsevier Interactive Patient Education [employer]2016 [employer]

## 2016-08-18 ENCOUNTER — Ambulatory Visit
Admission: EM | Admit: 2016-08-18 | Discharge: 2016-08-18 | Disposition: A | Discharge status: Home / Self Care | Payer: Medicare Other | Attending: Family Medicine | Admitting: Family Medicine

## 2016-08-18 ENCOUNTER — Ambulatory Visit: Payer: Medicare Other

## 2016-08-18 DIAGNOSIS — J219 Acute bronchiolitis, unspecified: Secondary | ICD-10-CM

## 2016-08-18 DIAGNOSIS — R0602 Shortness of breath: Secondary | ICD-10-CM

## 2016-08-18 DIAGNOSIS — Z87891 Personal history of nicotine dependence: Secondary | ICD-10-CM | POA: Insufficient documentation

## 2016-08-18 DIAGNOSIS — J441 Chronic obstructive pulmonary disease with (acute) exacerbation: Secondary | ICD-10-CM

## 2016-08-18 DIAGNOSIS — Z880 Allergy status to penicillin: Secondary | ICD-10-CM | POA: Insufficient documentation

## 2016-08-18 DIAGNOSIS — J440 Chronic obstructive pulmonary disease with acute lower respiratory infection: Secondary | ICD-10-CM | POA: Diagnosis not present

## 2016-08-18 MED ORDER — PREDNISONE 10 MG (21) PO TBPK: ORAL_TABLET | ORAL | 0 refills | Status: DC

## 2016-08-18 MED ORDER — METHYLPREDNISOLONE SODIUM SUCC 125 MG IJ SOLR: 125.0000 mg | Freq: Once | INTRAMUSCULAR | Status: DC

## 2016-08-18 MED ORDER — CEFUROXIME AXETIL 500 MG PO TABS: 500.0000 mg | ORAL_TABLET | Freq: Two times a day (BID) | ORAL | 0 refills | Status: DC

## 2016-08-18 MED ORDER — IPRATROPIUM-ALBUTEROL 0.5-2.5 (3) MG/3ML IN SOLN: 3.0000 mL | Freq: Once | RESPIRATORY_TRACT | Status: DC

## 2016-08-18 NOTE — ED Triage Notes (Signed)
Patient complains of shortness of breath, waking up in sweats, discomfort in his chest. Denies history of cardiac, but states he has had some lung issues. Pneumonia, bronchitis. History of panic attacks.

## 2016-08-18 NOTE — ED Notes (Signed)
Patient refused Duoneb and Solu-Medrol.

## 2016-08-18 NOTE — ED Provider Notes (Signed)
[facility]    CSN: [id number] Arrival date & time: 08/18/16  1136  First Provider Contact:  First MD Initiated Contact with Patient 08/18/16 1144        History   Chief Complaint Chief Complaint  Patient presents with  . Respiratory Distress    HPI Eugene Blackburn is a 45 y.o. male.   Patient reports shortness of breath and some sweating. He states she's been short of breath and sweating for about the last 2-3 months. 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 States over the last few months it was so strenuous job he lost about 20 pounds. He states that in May after she died he went to function for about a month and depression and as he came out of it he realized that his yard need to be mode. Basically he's tried him on his lawn about 3 times each time his shortness of breath and wheezing is gotten progressively worse. He is coughing up white thick film in the morning and he signed himself sweating through the night saturating his sheets and as he puts it sweat is absolutely redness and very funky. States he has take a shower right after he gets up in the morning and change his sheets. Shortness (been progressing and he finally came in to be seen and evaluated. He does have a history COPD he states that the albuterol inhaler and Spiriva inhaler that he is on has not really helped him that much. He states that Elwin SleightDulera is the inhaler does seem to help the most but his insurance will not pay for it. Currently Advair and Symbicort doesn't really do much for him which is only 2 inhalers approved by 6 insurance with dual medications. He stopped smoking about 10 years ago. As mentioned above his mother had breast cancer. He denies any fever at this time. He is also worried about pneumonia. He was offered an EKG when he first came in because he was diaphoretic and short of breath pulse ox was only 97 but he  declined EKG states he knows that this is his lungs and not his heart.   The history is provided by the patient. No language interpreter was used.  Shortness of Breath  Severity:  Severe Onset quality:  Unable to specify Duration:  2 months Timing:  Constant Progression:  Worsening Chronicity:  Recurrent Context: pollens   Context: not activity, not animal exposure and not known allergens   Relieved by:  Nothing Worsened by:  Coughing and exertion Ineffective treatments:  Inhaler Associated symptoms: cough and wheezing   Associated symptoms: no chest pain     Past Medical History:  Diagnosis Date  . Asthma   . Degenerative disorder of muscle   . Pancreas (digestive gland) works poorly (HCC)     There are no active problems to display for this patient.   Past Surgical History:  Procedure Laterality Date  . PANCREAS SURGERY         Home Medications    Prior to Admission medications   Medication Sig Start Date End Date Taking? Authorizing Provider  albuterol (PROVENTIL) (2.5 MG/3ML) 0.083% nebulizer solution Take 2.5 mg by nebulization every 6 (six) hours as needed for wheezing or shortness of breath.   Yes Historical Provider, MD  Fluticasone-Salmeterol (ADVAIR) 250-50 MCG/DOSE AEPB Inhale 1 puff into the lungs 2 (two) times daily.  Yes Historical Provider, MD  benzonatate (TESSALON) 200 MG capsule Take 1 capsule (200 mg total) by mouth 3 (three) times daily as needed for cough. 03/25/16   Clancy GourdJeanette Defelice, NP  cefUROXime (CEFTIN) 500 MG tablet Take 1 tablet (500 mg total) by mouth 2 (two) times daily. 08/18/16   Hassan RowanEugene Ardell Makarewicz, MD  doxycycline (VIBRAMYCIN) 50 MG capsule Take 2 capsules (100 mg total) by mouth 2 (two) times daily. 06/10/16   Irean HongJade J Sung, MD  fluticasone (FLONASE) 50 MCG/ACT nasal spray Place 1 spray into both nostrils daily.    Historical Provider, MD  HYDROmorphone (DILAUDID) 2 MG tablet Take 2 mg by mouth 3 (three) times daily as needed for severe pain.     Historical Provider, MD  levofloxacin (LEVAQUIN) 750 MG tablet Take 1 tablet (750 mg total) by mouth daily. 03/25/16   Clancy GourdJeanette Defelice, NP  oxyCODONE (OXYCONTIN) 20 mg 12 hr tablet Take 20 mg by mouth every 12 (twelve) hours.    Historical Provider, MD  predniSONE (DELTASONE) 20 MG tablet Take 2 tablets (40 mg total) by mouth daily with breakfast. 03/25/16   Clancy GourdJeanette Defelice, NP  predniSONE (STERAPRED UNI-PAK 21 TAB) 10 MG (21) TBPK tablet Sig 6 tablet day 1, 5 tablets day 2, 4 tablets day 3,,3tablets day 4, 2 tablets day 5, 1 tablet day 6 take all tablets orally 08/18/16   Hassan RowanEugene Contrell Ballentine, MD  tiotropium (SPIRIVA) 18 MCG inhalation capsule Place 18 mcg into inhaler and inhale daily.    Historical Provider, MD    Family History History reviewed. No pertinent family history.  Social History Social History  Substance Use Topics  . Smoking status: Former Smoker    Types: Cigarettes  . Smokeless tobacco: Current User  . Alcohol use No     Allergies   Penicillins and Zithromax [azithromycin]   Review of Systems Review of Systems  Respiratory: Positive for cough, shortness of breath and wheezing.   Cardiovascular: Negative for chest pain and leg swelling.  All other systems reviewed and are negative.    Physical Exam Triage Vital Signs ED Triage Vitals  Enc Vitals Group     BP 08/18/16 1142 117/74     Pulse Rate 08/18/16 1142 (!) 117     Resp 08/18/16 1142 20     Temp --      Temp src --      SpO2 08/18/16 1142 97 %     Weight --      Height --      Head Circumference --      Peak Flow --      Pain Score 08/18/16 1143 0     Pain Loc --      Pain Edu? --      Excl. in GC? --    No data found.   Updated Vital Signs BP 117/74 (BP Location: Left Arm)   Pulse (!) 117   Resp 20   SpO2 97%   Visual Acuity Right Eye Distance:   Left Eye Distance:   Bilateral Distance:    Right Eye Near:   Left Eye Near:    Bilateral Near:     Physical Exam  Constitutional: He is  oriented to person, place, and time. He appears well-developed and well-nourished.  HENT:  Head: Normocephalic and atraumatic.  Eyes: Pupils are equal, round, and reactive to light.  Neck: Normal range of motion.  Cardiovascular: Normal rate.   Pulmonary/Chest: No respiratory distress. He has decreased breath sounds. He  has wheezes. He has no rhonchi.  Musculoskeletal: Normal range of motion.  Neurological: He is alert and oriented to person, place, and time.  Skin: Skin is warm and dry.  Psychiatric: He has a normal mood and affect.     UC Treatments / Results  Labs (all labs ordered are listed, but only abnormal results are displayed) Labs Reviewed - No data to display  EKG  EKG Interpretation None       Radiology Dg Chest 2 View  Result Date: 08/18/2016 CLINICAL DATA:  Short of breath and cough EXAM: CHEST  2 VIEW COMPARISON:  06/09/2016 FINDINGS: Elevated left hemidiaphragm unchanged. Scarring in the right lung base with blunting of the right costophrenic angle unchanged. Negative for heart failure or pneumonia. Lungs otherwise clear and without change from the prior study. IMPRESSION: No active cardiopulmonary disease. Electronically Signed   By: Marlan Palau M.D.   On: 08/18/2016 12:31    Procedures Procedures (including critical care time)  Medications Ordered in UC Medications  ipratropium-albuterol (DUONEB) 0.5-2.5 (3) MG/3ML nebulizer solution 3 mL (not administered)  methylPREDNISolone sodium succinate (SOLU-MEDROL) 125 mg/2 mL injection 125 mg (not administered)     Initial Impression / Assessment and Plan / UC Course  I have reviewed the triage vital signs and the nursing notes.  Pertinent labs & imaging results that were available during my care of the patient were reviewed by me and considered in my medical decision making (see chart for details).  Clinical Course   Patient reports shortness of breath will give him on 25 mg Solu-Medrol DuoNeb  treatment and will obtain a chest x-ray to determine which antibiotic to use. He still declined EKG and we'll honor that request but will recommend he signed the [facility] form for [place].  Final Clinical Impressions(s) / UC Diagnoses   Final diagnoses:  COPD exacerbation (HCC)  Shortness of breath  Acute bronchiolitis with bronchospasm   I've been informed patient has now refused Solu-Medrol IM and a DuoNeb aerosol treatment. We'll place most Ceftin since she is allergic to Zithromax allergic to penicillin chest x-ray was negative and place on 6 day course of prednisone recommend he follow-up with his PCP and asked the nursing staff again AMA form signed on him   New Prescriptions New Prescriptions   CEFUROXIME (CEFTIN) 500 MG TABLET    Take 1 tablet (500 mg total) by mouth 2 (two) times daily.   PREDNISONE (STERAPRED UNI-PAK 21 TAB) 10 MG (21) TBPK TABLET    Sig 6 tablet day 1, 5 tablets day 2, 4 tablets day 3,,3tablets day 4, 2 tablets day 5, 1 tablet day 6 take all tablets orally     Hassan Rowan, MD 08/18/16 1310

## 2017-03-10 ENCOUNTER — Ambulatory Visit
Admission: EM | Admit: 2017-03-10 | Discharge: 2017-03-10 | Disposition: A | Discharge status: Home / Self Care | Payer: Medicare Other | Attending: Family Medicine | Admitting: Family Medicine

## 2017-03-10 ENCOUNTER — Encounter: Payer: Self-pay | Admitting: Emergency Medicine

## 2017-03-10 DIAGNOSIS — K0889 Other specified disorders of teeth and supporting structures: Secondary | ICD-10-CM | POA: Diagnosis not present

## 2017-03-10 DIAGNOSIS — K029 Dental caries, unspecified: Secondary | ICD-10-CM | POA: Diagnosis not present

## 2017-03-10 MED ORDER — CLINDAMYCIN HCL 300 MG PO CAPS
300.0000 mg | ORAL_CAPSULE | Freq: Three times a day (TID) | ORAL | 0 refills | Status: DC
Start: 2017-03-10 — End: 2018-03-30

## 2017-03-10 NOTE — ED Provider Notes (Signed)
[facility]    CSN: [id number] Arrival date & time: 03/10/17  1658     History   Chief Complaint Chief Complaint  Patient presents with  . Dental Pain    HPI Eugene Blackburn is a 46 y.o. male.   Patient for 46 YEAR old white male with history of progressive dental caries and dental problems. He's had a long-standing history of dental problems. He has history of pancreatic insufficiency has chronic pancreatitis. He's currently disabled states that he's had frustration in trying to get dental extractions he has to have HIS lower teeth pulled sig get fitted for a partial plate manes have his upper teeth pulled. Reports it was some concern Medicaid not approving his dental extractions but they stated they could not see the x-rays well. He reports he only has a few teeth in the bottom not sure what they will looking at basically ([place] frustrated he is taking a lawyer to help fight to get his right to have his teeth pulled. States his had a loss of or infections he feels is making him sick because of the amount of infections mouth. He's allergic to medicine penicillins and a Zithromax. He stopped smoking several years ago. Along with pancreatic disease he has degenerative joint disease and asthma. No pertinent family medical history related to this visit   The history is provided by the patient.  Dental Pain  Location:  Upper and lower Quality:  Aching and burning Timing:  Constant   Past Medical History:  Diagnosis Date  . Asthma   . Degenerative disorder of muscle   . Pancreas (digestive gland) works poorly     There are no active problems to display for this patient.   Past Surgical History:  Procedure Laterality Date  . PANCREAS SURGERY         Home Medications    Prior to Admission medications   Medication Sig Start Date End Date Taking? Authorizing Provider  albuterol (PROVENTIL) (2.5 MG/3ML) 0.083% nebulizer solution Take 2.5 mg by nebulization every  6 (six) hours as needed for wheezing or shortness of breath.    Historical Provider, MD  clindamycin (CLEOCIN) 300 MG capsule Take 1 capsule (300 mg total) by mouth 3 (three) times daily. 03/10/17   Hassan RowanEugene Skylan Gift, MD  fluticasone (FLONASE) 50 MCG/ACT nasal spray Place 1 spray into both nostrils daily.    Historical Provider, MD  Fluticasone-Salmeterol (ADVAIR) 250-50 MCG/DOSE AEPB Inhale 1 puff into the lungs 2 (two) times daily.    Historical Provider, MD  levofloxacin (LEVAQUIN) 750 MG tablet Take 1 tablet (750 mg total) by mouth daily. 03/25/16   Clancy GourdJeanette Defelice, NP  oxyCODONE (OXYCONTIN) 20 mg 12 hr tablet Take 20 mg by mouth every 12 (twelve) hours.    Historical Provider, MD  tiotropium (SPIRIVA) 18 MCG inhalation capsule Place 18 mcg into inhaler and inhale daily.    Historical Provider, MD    Family History History reviewed. No pertinent family history.  Social History Social History  Substance Use Topics  . Smoking status: Former Smoker    Types: Cigarettes  . Smokeless tobacco: Never Used  . Alcohol use No     Allergies   Penicillins and Zithromax [azithromycin]   Review of Systems Review of Systems  HENT: Positive for dental problem.   All other systems reviewed and are negative.    Physical Exam Triage Vital Signs ED Triage Vitals  Enc Vitals Group     BP 03/10/17 1719 114/80  Pulse Rate 03/10/17 1719 (!) 105     Resp 03/10/17 1719 16     Temp 03/10/17 1719 98.3 F (36.8 C)     Temp Source 03/10/17 1719 Oral     SpO2 03/10/17 1719 96 %     Weight 03/10/17 1716 240 lb (108.9 kg)     Height 03/10/17 1716 6' (1.829 m)     Head Circumference --      Peak Flow --      Pain Score 03/10/17 1718 5     Pain Loc --      Pain Edu? --      Excl. in GC? --    No data found.   Updated Vital Signs BP 114/80 (BP Location: Right Arm)   Pulse (!) 105   Temp 98.3 F (36.8 C) (Oral)   Resp 16   Ht 6' (1.829 m)   Wt 240 lb (108.9 kg)   SpO2 96%   BMI 32.55  kg/m   Visual Acuity Right Eye Distance:   Left Eye Distance:   Bilateral Distance:    Right Eye Near:   Left Eye Near:    Bilateral Near:     Physical Exam  Constitutional: He appears well-developed.  HENT:  Head: Normocephalic and atraumatic.  Right Ear: Hearing, tympanic membrane, external ear and ear canal normal.  Left Ear: Hearing, tympanic membrane, external ear and ear canal normal.  Nose: Nose normal. Right sinus exhibits no maxillary sinus tenderness and no frontal sinus tenderness. Left sinus exhibits no maxillary sinus tenderness and no frontal sinus tenderness.  Mouth/Throat: Abnormal dentition. Dental abscesses present.  Eyes: Pupils are equal, round, and reactive to light.  Neck: Neck supple.  Musculoskeletal: Normal range of motion.  Neurological: He is alert.  Skin: Skin is warm.  Psychiatric: He has a normal mood and affect.  Vitals reviewed.    UC Treatments / Results  Labs (all labs ordered are listed, but only abnormal results are displayed) Labs Reviewed - No data to display  EKG  EKG Interpretation None       Radiology No results found.  Procedures Procedures (including critical care time)  Medications Ordered in UC Medications - No data to display   Initial Impression / Assessment and Plan / UC Course  I have reviewed the triage vital signs and the nursing notes.  Pertinent labs & imaging results that were available during my care of the patient were reviewed by me and considered in my medical decision making (see chart for details).    We'll place on Cleocin 300 mg 3 times a day for 10 days.   Final Clinical Impressions(s) / UC Diagnoses   Final diagnoses:  Pain, dental  Pain due to dental caries  Toothache    New Prescriptions Discharge Medication List as of 03/10/2017  5:36 PM    START taking these medications   Details  clindamycin (CLEOCIN) 300 MG capsule Take 1 capsule (300 mg total) by mouth 3 (three) times  daily., Starting Wed 03/10/2017, Normal         Note: This dictation was prepared with Dragon dictation along with smaller phrase technology. Any transcriptional errors that result from this process are unintentional.   Hassan Rowan, MD 03/10/17 1819

## 2017-03-10 NOTE — ED Triage Notes (Signed)
Patient c/o dental pain off and on for 2 months.  Patient has appointment with dentist at the end of this month.

## 2017-12-16 ENCOUNTER — Ambulatory Visit: Payer: Medicare Other

## 2017-12-16 ENCOUNTER — Ambulatory Visit
Admission: EM | Admit: 2017-12-16 | Discharge: 2017-12-16 | Disposition: A | Discharge status: Home / Self Care | Payer: Medicare Other | Attending: Emergency Medicine | Admitting: Emergency Medicine

## 2017-12-16 DIAGNOSIS — J40 Bronchitis, not specified as acute or chronic: Secondary | ICD-10-CM | POA: Insufficient documentation

## 2017-12-16 DIAGNOSIS — J449 Chronic obstructive pulmonary disease, unspecified: Secondary | ICD-10-CM | POA: Insufficient documentation

## 2017-12-16 DIAGNOSIS — R05 Cough: Secondary | ICD-10-CM

## 2017-12-16 DIAGNOSIS — R059 Cough, unspecified: Secondary | ICD-10-CM

## 2017-12-16 MED ORDER — DOXYCYCLINE HYCLATE 100 MG PO CAPS: 100.0000 mg | ORAL_CAPSULE | Freq: Two times a day (BID) | ORAL | 0 refills | Status: AC

## 2017-12-16 MED ORDER — PREDNISONE 20 MG PO TABS: 40.0000 mg | ORAL_TABLET | Freq: Every day | ORAL | 0 refills | Status: AC

## 2017-12-16 NOTE — Discharge Instructions (Signed)
-  doxycycline: one tablet twice a day for 7 days -prednisone: two tablets once a day for 5 days -continue with home COPD meds -albuterol inhaler as needed -keep appt with PCP
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
## 2018-03-30 ENCOUNTER — Other Ambulatory Visit: Payer: Self-pay

## 2018-03-30 ENCOUNTER — Ambulatory Visit: Payer: Medicare Other

## 2018-03-30 ENCOUNTER — Ambulatory Visit
Admission: EM | Admit: 2018-03-30 | Discharge: 2018-03-30 | Disposition: A | Discharge status: Home / Self Care | Payer: Medicare Other | Attending: Family Medicine | Admitting: Family Medicine

## 2018-03-30 DIAGNOSIS — J0100 Acute maxillary sinusitis, unspecified: Secondary | ICD-10-CM | POA: Diagnosis not present

## 2018-03-30 DIAGNOSIS — J069 Acute upper respiratory infection, unspecified: Secondary | ICD-10-CM

## 2018-03-30 DIAGNOSIS — M940 Chondrocostal junction syndrome [Tietze]: Secondary | ICD-10-CM | POA: Diagnosis not present

## 2018-03-30 DIAGNOSIS — R05 Cough: Secondary | ICD-10-CM | POA: Diagnosis present

## 2018-03-30 DIAGNOSIS — Z880 Allergy status to penicillin: Secondary | ICD-10-CM | POA: Diagnosis not present

## 2018-03-30 DIAGNOSIS — Z79899 Other long term (current) drug therapy: Secondary | ICD-10-CM | POA: Diagnosis not present

## 2018-03-30 DIAGNOSIS — Z7951 Long term (current) use of inhaled steroids: Secondary | ICD-10-CM | POA: Insufficient documentation

## 2018-03-30 DIAGNOSIS — Z87891 Personal history of nicotine dependence: Secondary | ICD-10-CM | POA: Diagnosis not present

## 2018-03-30 DIAGNOSIS — Z801 Family history of malignant neoplasm of trachea, bronchus and lung: Secondary | ICD-10-CM | POA: Insufficient documentation

## 2018-03-30 DIAGNOSIS — Z8379 Family history of other diseases of the digestive system: Secondary | ICD-10-CM | POA: Insufficient documentation

## 2018-03-30 DIAGNOSIS — B9789 Other viral agents as the cause of diseases classified elsewhere: Secondary | ICD-10-CM | POA: Diagnosis not present

## 2018-03-30 DIAGNOSIS — Z881 Allergy status to other antibiotic agents status: Secondary | ICD-10-CM | POA: Diagnosis not present

## 2018-03-30 MED ORDER — BENZONATATE 100 MG PO CAPS: 100.0000 mg | ORAL_CAPSULE | Freq: Three times a day (TID) | ORAL | 0 refills | Status: DC | PRN

## 2018-03-30 MED ORDER — PREDNISONE 20 MG PO TABS: 40.0000 mg | ORAL_TABLET | Freq: Every day | ORAL | 0 refills | Status: DC

## 2018-03-30 MED ORDER — DOXYCYCLINE HYCLATE 100 MG PO CAPS: 100.0000 mg | ORAL_CAPSULE | Freq: Two times a day (BID) | ORAL | 0 refills | Status: DC

## 2018-03-30 NOTE — Discharge Instructions (Signed)
Take medication as prescribed. Rest. Drink plenty of fluids.  ° °Follow up with your primary care physician this week as needed. Return to Urgent care for new or worsening concerns.  ° °

## 2018-03-30 NOTE — ED Provider Notes (Signed)
[facility] ____________________________________________  Time seen: Approximately 1030 AM  I have reviewed the triage vital signs and the nursing notes.   HISTORY  Chief Complaint Cough  HPI Eugene Blackburn is a 47 y.o. male presenting for evaluation of cough and congestion.  States approximately 2-3 weeks ago he felt that he contracted the flu from his family, and states that he never got over it describing lingering cough and congestion.  However reports over the last 5 days he has had increased cough and nasal congestion with accompanying sinus pressure as well as pain in his right ribs when he is coughing.  States if he sits completely still not moves has no pain to ribs, but reports pain is present with deep breath as well as coughing.  Denies fevers in the last few days.  Has continued to overall remain active.  Reports occasional wheezing, and has intermittently used home albuterol inhaler. Unresolved with multiple over-the-counter cough and congestion medications.  Denies any other rib pain or chest pain.  States some shortness of breath, described as pain makes him not want to take his deeper breaths.  He has had bronchitis before as well as reports history of COPD as well as chronic pancreatitis.  Denies any fall or direct trauma.  Reports otherwise feels well, denies other acute changes.  Denies abdominal pain, dysuria, extremity pain, extremity swelling or rash. Denies recent sickness. Denies recent antibiotic use.    Past Medical History:  Diagnosis Date  . Asthma   . Degenerative disorder of muscle   . Pancreas (digestive gland) works poorly     There are no active problems to display for this patient.   Past Surgical History:  Procedure Laterality Date  . PANCREAS SURGERY       No current facility-administered medications for this encounter.   Current Outpatient Medications:  .  fluticasone (FLONASE) 50 MCG/ACT nasal spray, Place 1 spray into both  nostrils daily., Disp: , Rfl:  .  Fluticasone-Salmeterol (ADVAIR) 250-50 MCG/DOSE AEPB, Inhale 1 puff into the lungs 2 (two) times daily., Disp: , Rfl:  .  linaclotide (LINZESS) 72 MCG capsule, Take 72 mcg by mouth daily before breakfast., Disp: , Rfl:  .  oxyCODONE (OXYCONTIN) 20 mg 12 hr tablet, Take 20 mg by mouth every 12 (twelve) hours., Disp: , Rfl:  .  PANCREAZE 16109 units CPEP, TAKE ONE CAPSULE BY MOUTH 3 TIMES A DAY, Disp: , Rfl: 5 .  ranitidine (ZANTAC) 75 MG tablet, Take 75 mg by mouth 2 (two) times daily., Disp: , Rfl:  .  tiotropium (SPIRIVA) 18 MCG inhalation capsule, Place 18 mcg into inhaler and inhale daily., Disp: , Rfl:  .  VENTOLIN HFA 108 (90 Base) MCG/ACT inhaler, 2 INHALATION EVERY FOUR HOURS AS NEEDED, Disp: , Rfl: 5 .  benzonatate (TESSALON PERLES) 100 MG capsule, Take 1 capsule (100 mg total) by mouth 3 (three) times daily as needed for cough., Disp: 15 capsule, Rfl: 0 .  doxycycline (VIBRAMYCIN) 100 MG capsule, Take 1 capsule (100 mg total) by mouth 2 (two) times daily., Disp: 20 capsule, Rfl: 0 .  predniSONE (DELTASONE) 20 MG tablet, Take 2 tablets (40 mg total) by mouth daily., Disp: 10 tablet, Rfl: 0  Allergies Zithromax [azithromycin] and Penicillins  Family History  Problem Relation Age of Onset  . Lung cancer Mother   . Pancreatic disease Father     Social History Social History   Tobacco Use  . Smoking status: Former Smoker  Types: Cigarettes  . Smokeless tobacco: Never Used  Substance Use Topics  . Alcohol use: No    Alcohol/week: 0.0 oz  . Drug use: No    Review of Systems Constitutional: As above.  ENT: No sore throat. As above.  Cardiovascular: Denies chest pain. Reports only right rib pain.  Respiratory: AS above.  Gastrointestinal: No abdominal pain.  No nausea, no vomiting.  No diarrhea.   Musculoskeletal: Negative for back pain. Skin: Negative for rash.  ____________________________________________   PHYSICAL EXAM:  VITAL  SIGNS: ED Triage Vitals  Enc Vitals Group     BP 03/30/18 0915 101/70     Pulse Rate 03/30/18 0915 (!) 111     Resp 03/30/18 0915 18     Temp 03/30/18 0915 98 F (36.7 C)     Temp Source 03/30/18 0915 Oral     SpO2 03/30/18 0915 96 %     Weight 03/30/18 0912 243 lb (110.2 kg)     Height 03/30/18 0912 6' (1.829 m)     Head Circumference --      Peak Flow --      Pain Score 03/30/18 0911 8     Pain Loc --      Pain Edu? --      Excl. in GC? --     Constitutional: Alert and oriented. Well appearing and in no acute distress. Eyes: Conjunctivae are normal. Head: Atraumatic.Mild to moderate tenderness to palpation bilateral maxillary sinuses.  No frontal sinus tenderness palpation.  No swelling. No erythema.   Ears: no erythema, normal TMs bilaterally.   Nose: nasal congestion with bilateral nasal turbinate erythema and edema.   Mouth/Throat: Mucous membranes are moist.  Oropharynx non-erythematous.No tonsillar swelling or exudate.  Neck: No stridor.  No cervical spine tenderness to palpation. Hematological/Lymphatic/Immunilogical: No cervical lymphadenopathy. Cardiovascular: Normal rate, regular rhythm. Grossly normal heart sounds.  Good peripheral circulation. Respiratory: Normal respiratory effort.  No retractions. Good air movement.  Mild scattered rhonchi and inspiratory wheezes, no focal area of consolidation auscultated.  Dry intermittent cough noted in room with mild bronchospasm.  Speaks in complete sentences. Musculoskeletal: No lower extremity tenderness nor edema. No cervical, thoracic or lumbar tenderness to palpation.  Right lateral ribs lower half ribs diffuse tenderness and patient guarding as well as pulling away during exam, no point rib tenderness noted but rather diffuse tenderness, no rash erythema or ecchymosis noted.  No anterior or posterior chest tenderness noted.  Neurologic:  Normal speech and language. No gross focal neurologic deficits are appreciated. No gait  instability. Skin:  Skin is warm, dry and intact. No rash noted. Psychiatric: Mood and affect are normal. Speech and behavior are normal.  ___________________________________________   LABS (all labs ordered are listed, but only abnormal results are displayed)  Labs Reviewed - No data to display ____________________________________________  RADIOLOGY  Dg Chest 2 View  Result Date: 03/30/2018 CLINICAL DATA:  Cough and congestion EXAM: CHEST - 2 VIEW COMPARISON:  12/16/2017 FINDINGS: Cardiac shadow is within normal limits. Lungs are hyper aerated bilaterally. No focal infiltrate or sizable effusion is seen. Stable costophrenic blunting is noted bilaterally. No bony abnormality is seen. IMPRESSION: No acute abnormality noted. Electronically Signed   By: Alcide Clever M.D.   On: 03/30/2018 09:50   ____________________________________________   PROCEDURES Procedures    INITIAL IMPRESSION / ASSESSMENT AND PLAN / ED COURSE  Pertinent labs & imaging results that were available during my care of the patient were reviewed by me and  considered in my medical decision making (see chart for details).  Well-appearing patient.  No acute distress.  Patient expressed need to hurry up and leave the urgent care to let his dogs out.  Reporting he feels that he is never fully got over having the flu recently.  He was not seen for influenza.  Reports cough continued and over the last few days cough increased with associated right-sided rib pain.  Chest x-ray reviewed, radiology report as above, per radiologist no acute abnormality noted.  Suspect patient with sinusitis, bronchitis and costochondritis vs muscular strain.  As patient reports pain with deep breath and mildly tachycardic, discussed evaluation of PE, patient declines any previous history of PE or blood clots, and further states "I do not have a blood clot "and declines further evaluation of this.  Will treat patient with oral prednisone, doxycycline  and continue home albuterol inhaler use as well as PRN Tessalon Perles.  Encourage rest, fluids, supportive care, discussed strict follow-up and return parameters.Discussed indication, risks and benefits of medications with patient.  Discussed follow up with Primary care physician this week. Discussed follow up and return parameters including no resolution or any worsening concerns. Patient verbalized understanding and agreed to plan.   ____________________________________________   FINAL CLINICAL IMPRESSION(S) / ED DIAGNOSES  Final diagnoses:  Viral URI with cough  Acute maxillary sinusitis, recurrence not specified  Costochondritis     ED Discharge Orders        Ordered    doxycycline (VIBRAMYCIN) 100 MG capsule  2 times daily     03/30/18 1049    predniSONE (DELTASONE) 20 MG tablet  Daily     03/30/18 1049    benzonatate (TESSALON PERLES) 100 MG capsule  3 times daily PRN     03/30/18 1049       Note: This dictation was prepared with Dragon dictation along with smaller phrase technology. Any transcriptional errors that result from this process are unintentional.         Renford DillsMiller, Chaney Ingram, NP 03/30/18 1702

## 2018-03-30 NOTE — ED Triage Notes (Signed)
Patient complains of cough and congestion. Patient reports that when he breathes in "my right lung feels like it will pop". Patient states that he has had cough and cold symptoms for 1 month but "lung pain" started 5 days ago.

## 2018-04-15 ENCOUNTER — Ambulatory Visit
Admission: EM | Admit: 2018-04-15 | Discharge: 2018-04-15 | Discharge status: Absent without leave | Payer: Medicare Other

## 2018-04-17 ENCOUNTER — Ambulatory Visit: Payer: Medicare Other

## 2018-04-17 ENCOUNTER — Ambulatory Visit
Admission: EM | Admit: 2018-04-17 | Discharge: 2018-04-17 | Disposition: A | Discharge status: Home / Self Care | Payer: Medicare Other | Attending: Emergency Medicine | Admitting: Emergency Medicine

## 2018-04-17 DIAGNOSIS — Z7951 Long term (current) use of inhaled steroids: Secondary | ICD-10-CM | POA: Diagnosis not present

## 2018-04-17 DIAGNOSIS — Z79899 Other long term (current) drug therapy: Secondary | ICD-10-CM | POA: Insufficient documentation

## 2018-04-17 DIAGNOSIS — R05 Cough: Secondary | ICD-10-CM | POA: Diagnosis present

## 2018-04-17 DIAGNOSIS — R091 Pleurisy: Secondary | ICD-10-CM | POA: Insufficient documentation

## 2018-04-17 MED ORDER — KETOROLAC TROMETHAMINE 60 MG/2ML IM SOLN
30.0000 mg | Freq: Once | INTRAMUSCULAR | Status: AC
  Administered 2018-04-17: 30 mg via INTRAMUSCULAR

## 2018-04-17 MED ORDER — IBUPROFEN 600 MG PO TABS: 600.0000 mg | ORAL_TABLET | Freq: Four times a day (QID) | ORAL | 0 refills | Status: DC | PRN

## 2018-04-17 NOTE — ED Triage Notes (Signed)
Pt is here following up after a sinus infections, said he was given doxycycline and said it hasn't worked. York SpanielSaid it worked for his sinus pressure and pain but has not improved his lung pain which hurts when he coughs. York SpanielSaid he is having a productive cough but no fever.

## 2018-04-17 NOTE — Discharge Instructions (Addendum)
Follow-up with your doctor tomorrow.  We suggest that she run a test called a d-dimer because we are concerned that  you may have a blood clot in your lungs causing your symptoms.  In the meantime, take 600 mg of ibuprofen with 1 g of Tylenol as needed 3 or 4 times a day for the next several days.  Go immediately to the ER if your pain gets worse, if you have severe shortness of breath, if you start coughing up blood, or for any other concerns.

## 2018-04-17 NOTE — ED Provider Notes (Signed)
HPI  SUBJECTIVE:  Eugene Blackburn is a 47 y.o. male who presents with persistent cough for the past month.  He reports right sided sharp, sore rib pain during this time that he states is getting worse.  States it is located under his right breast and axillary area.  It is intermittent, present only with coughing and deep inspiration.  Is not associated torso rotation.  He reports having some sweats at night for the past few nights, denies fevers.  States that his cough was productive of 2 "white chunks" last night but denies hemoptysis.  He reports wheezing.  He denies any other chest pain.  Denies Shortness of breath, dyspnea on exertion, trauma, calf pain, swelling, immobilization, surgery in the past 4 weeks.  He denies GERD symptoms.  He states that his sinus symptoms which included nasal congestion, rhinorrhea, postnasal drip completely resolved with the doxycycline.  He denies unintentional weight loss.   Patient was seen here 18 days ago for cough and congestion.  He was noted to have right-sided chest pain with coughing. his chest x-ray was normal.  He was  tachycardic, however he declined workup for PE.  Ultimately thought to have a viral URI with cough, acute maxillary sinusitis and costochondritis.  Sent home with prednisone, doxycycline, Tessalon Perles, told to continue his albuterol inhaler. he finished the doxycycline, he was only able to tolerate 4 days of the prednisone. states that it started giving him muscle spasms.  He has been continuing to use albuterol 2 puffs every 4 hours, and is compliant with his Spiriva and Advair.  His albuterol seems to help.  Patient states that he has tried 400 mg of ibuprofen intermittently with some improvement in his chest pain.  His chest pain is worse with cough and deep breathing.  He does state that he had some albuterol an hour prior to evaluation.  He quit smoking 2 years ago.  He has a past medical history of COPD/asthma, pancreatitis degenerative  muscle disease.  No history of disease, alcohol use, cancer, DVT, PE.  PMD: Dr. Clint Guy at preferred primary [facility] in [place].  States that he has follow-up with his provider tomorrow at [phone].  Past Medical History:  Diagnosis Date  . Asthma   . Degenerative disorder of muscle   . Pancreas (digestive gland) works poorly     Past Surgical History:  Procedure Laterality Date  . PANCREAS SURGERY      Family History  Problem Relation Age of Onset  . Lung cancer Mother   . Pancreatic disease Father     Social History   Tobacco Use  . Smoking status: Former Smoker    Types: Cigarettes  . Smokeless tobacco: Never Used  Substance Use Topics  . Alcohol use: No    Alcohol/week: 0.0 oz  . Drug use: No    No current facility-administered medications for this encounter.   Current Outpatient Medications:  .  benzonatate (TESSALON PERLES) 100 MG capsule, Take 1 capsule (100 mg total) by mouth 3 (three) times daily as needed for cough., Disp: 15 capsule, Rfl: 0 .  doxycycline (VIBRAMYCIN) 100 MG capsule, Take 1 capsule (100 mg total) by mouth 2 (two) times daily., Disp: 20 capsule, Rfl: 0 .  fluticasone (FLONASE) 50 MCG/ACT nasal spray, Place 1 spray into both nostrils daily., Disp: , Rfl:  .  Fluticasone-Salmeterol (ADVAIR) 250-50 MCG/DOSE AEPB, Inhale 1 puff into the lungs 2 (two) times daily., Disp: , Rfl:  .  ibuprofen (ADVIL,MOTRIN) 600  MG tablet, Take 1 tablet (600 mg total) by mouth every 6 (six) hours as needed., Disp: 30 tablet, Rfl: 0 .  linaclotide (LINZESS) 72 MCG capsule, Take 72 mcg by mouth daily before breakfast., Disp: , Rfl:  .  oxyCODONE (OXYCONTIN) 20 mg 12 hr tablet, Take 20 mg by mouth every 12 (twelve) hours., Disp: , Rfl:  .  PANCREAZE [id number] units CPEP, TAKE ONE CAPSULE BY MOUTH 3 TIMES A DAY, Disp: , Rfl: 5 .  predniSONE (DELTASONE) 20 MG tablet, Take 2 tablets (40 mg total) by mouth daily., Disp: 10 tablet, Rfl: 0 .  ranitidine (ZANTAC) 75 MG tablet, Take 75 mg  by mouth 2 (two) times daily., Disp: , Rfl:  .  tiotropium (SPIRIVA) 18 MCG inhalation capsule, Place 18 mcg into inhaler and inhale daily., Disp: , Rfl:  .  VENTOLIN HFA 108 (90 Base) MCG/ACT inhaler, 2 INHALATION EVERY FOUR HOURS AS NEEDED, Disp: , Rfl: 5  Allergies  Allergen Reactions  . Zithromax [Azithromycin] Rash    Patient states azithromycin caused him to "bleed"  . Penicillins Itching and Rash     ROS  As noted in HPI.   Physical Exam  BP (!) 116/58 (BP Location: Right Arm)   Pulse (!) 114   Temp 98 F (36.7 C) (Oral)   Resp 18   SpO2 98%   Constitutional: Well developed, well nourished, no acute distress Eyes:  EOMI, conjunctiva normal bilaterally HENT: Normocephalic, atraumatic,mucus membranes moist Respiratory: Poor inspiratory effort.  Lungs clear bilaterally.  Fair air movement.  No chest wall tenderness over the entire right chest anteriorly or posteriorly.  No crepitus. Cardiovascular: Regular tachycardia, no murmurs, rubs, gallops. GI: nondistended skin: No rash, skin intact Musculoskeletal: Calves symmetric, nontender, no edema Neurologic: Alert & oriented x 3, no focal neuro deficits Psychiatric: Speech and behavior appropriate   ED Course   Medications  ketorolac (TORADOL) injection 30 mg (30 mg Intramuscular Given 04/17/18 1641)    Orders Placed This Encounter  Procedures  . DG Chest 2 View    Standing Status:   Standing    Number of Occurrences:   1    Order Specific Question:   Reason for Exam (SYMPTOM  OR DIAGNOSIS REQUIRED)    Answer:   R pleuritic CP    No results found for this or any previous visit (from the past 24 hour(s)). Dg Chest 2 View  Result Date: 04/17/2018 CLINICAL DATA:  C/O right sided chest pain with cough and deep inspiration. Cough has been for "a while" EXAM: CHEST - 2 VIEW COMPARISON:  03/30/2018 FINDINGS: Heart size is normal. There is mild perihilar peribronchial thickening. Stable RIGHT pleural thickening. Stable  elevation of LEFT hemidiaphragm. There are no focal consolidations. No pulmonary edema. IMPRESSION: Stable appearance of the chest.  No evidence for acute  abnormality. Electronically Signed   By: Norva PavlovElizabeth  Brown M.D.   On: 04/17/2018 15:58    ED Clinical Impression  Pleurisy   ED Assessment/Plan  Previous records reviewed.  As noted in HPI.  [facility]otic database reviewed for this patient.  Patient had 90 OxyContin 20 mg ER dispensed on 4/18.  Reviewed imaging independently.  No pneumonia. No acute process.  See radiology report for full details.  Discussed again with patient the possibility of doing a d-dimer to rule out PE because he is still tachycardic and is reporting pleuritic chest pain, was initially amenable to this.  However he later changed his mind after discussing with him that  if his d-dimer was positive then I would have to send him to the emergency department for further evaluation.  He expressed concern about the welfare of his dogs if he were to be admitted to the hospital.  He states that he would not have anybody to take care of them, and so is wanting to go home and make arrangements for them to possibly go into a kennel prior to receiving further evaluation.  He states that he will follow-up with his primary care physician tomorrow for routine care/medication refill and states that he will request that she obtain a d-dimer in addition to his other blood work.  Had an extensive discussion with the patient that PE is a life-threatening diagnosis, however, patient repeatedly declined having the testing done today.  Together we have decided to give him a shot of Toradol 30 mg IM x1, send him home with 600 mg of ibuprofen to take with 1 g of Tylenol 3 or 4 times a day in addition to his OxyContin.  He will need to follow-up with his primary care physician tomorrow.   will suggest that she run a d-dimer with the rest of his lab work.  Do not think that further antibiotics in the  absence of pneumonia or focal lung findings would be beneficial and do not think that further steroids would be helpful.  The meantime, he will continue his albuterol 2 puffs every 4-6 hours, Spiriva, Advair.  Gave pt spacer and Showed him how to use it  We will have the patient leave AMA.  Encouraged him to return or go to the ER for any concerns.  Discussed imaging, MDM, treatment plan, and plan for follow-up with patient. Discussed sn/sx that should prompt return to the ED. patient agrees with plan.   Meds ordered this encounter  Medications  . ketorolac (TORADOL) injection 30 mg  . ibuprofen (ADVIL,MOTRIN) 600 MG tablet    Sig: Take 1 tablet (600 mg total) by mouth every 6 (six) hours as needed.    Dispense:  30 tablet    Refill:  0    *This clinic note was created using Scientist, clinical (histocompatibility and immunogenetics). Therefore, there may be occasional mistakes despite careful proofreading.   ?   Domenick Gong, MD 04/17/18 1704

## 2019-01-26 DIAGNOSIS — R200 Anesthesia of skin: Secondary | ICD-10-CM | POA: Insufficient documentation

## 2019-01-26 DIAGNOSIS — R251 Tremor, unspecified: Secondary | ICD-10-CM | POA: Insufficient documentation

## 2019-09-29 ENCOUNTER — Other Ambulatory Visit: Payer: Self-pay | Admitting: Anesthesiology

## 2019-09-29 DIAGNOSIS — M542 Cervicalgia: Secondary | ICD-10-CM

## 2020-01-19 ENCOUNTER — Encounter: Payer: Self-pay | Admitting: Emergency Medicine

## 2020-01-19 ENCOUNTER — Other Ambulatory Visit: Payer: Self-pay

## 2020-01-19 ENCOUNTER — Ambulatory Visit
Admission: EM | Admit: 2020-01-19 | Discharge: 2020-01-19 | Disposition: A | Discharge status: Home / Self Care | Payer: Medicare Other | Attending: Family Medicine | Admitting: Family Medicine

## 2020-01-19 DIAGNOSIS — R519 Headache, unspecified: Secondary | ICD-10-CM | POA: Insufficient documentation

## 2020-01-19 DIAGNOSIS — Z20822 Contact with and (suspected) exposure to covid-19: Secondary | ICD-10-CM | POA: Diagnosis not present

## 2020-01-19 DIAGNOSIS — J0110 Acute frontal sinusitis, unspecified: Secondary | ICD-10-CM | POA: Diagnosis not present

## 2020-01-19 DIAGNOSIS — Z87891 Personal history of nicotine dependence: Secondary | ICD-10-CM | POA: Diagnosis not present

## 2020-01-19 DIAGNOSIS — Z7952 Long term (current) use of systemic steroids: Secondary | ICD-10-CM | POA: Diagnosis not present

## 2020-01-19 DIAGNOSIS — Z79899 Other long term (current) drug therapy: Secondary | ICD-10-CM | POA: Insufficient documentation

## 2020-01-19 DIAGNOSIS — J0100 Acute maxillary sinusitis, unspecified: Secondary | ICD-10-CM | POA: Diagnosis not present

## 2020-01-19 DIAGNOSIS — R0981 Nasal congestion: Secondary | ICD-10-CM | POA: Insufficient documentation

## 2020-01-19 DIAGNOSIS — R062 Wheezing: Secondary | ICD-10-CM

## 2020-01-19 MED ORDER — DOXYCYCLINE HYCLATE 100 MG PO TABS: 100.0000 mg | ORAL_TABLET | Freq: Two times a day (BID) | ORAL | 0 refills | Status: DC

## 2020-01-19 NOTE — Discharge Instructions (Signed)
Over the counter steroid nasal spray Continue home inhaler

## 2020-01-19 NOTE — ED Triage Notes (Signed)
Patient here fore sinus congestion and pressure, stuffy nose, and HAs for the past 2 weeks.  Patient reports cough and chest congestion for a week.  Patient denies fevers.

## 2020-01-19 NOTE — ED Provider Notes (Signed)
[facility]    CSN: [id number] Arrival date & time: 01/19/20  1654      History   Chief Complaint Chief Complaint  Patient presents with  . Sinus Problem    HPI Eugene Blackburn is a 49 y.o. male.   50 yo male with a c/o sinus congestion, pressure and headaches for the past 2 weeks. Over the past week has also noticed a slightly productive cough and fatigue. States he has an albuterol inhaler at home that he uses occasionally.    Sinus Problem    Past Medical History:  Diagnosis Date  . Asthma   . Degenerative disorder of muscle   . Pancreas (digestive gland) works poorly     There are no problems to display for this patient.   Past Surgical History:  Procedure Laterality Date  . PANCREAS SURGERY         Home Medications    Prior to Admission medications   Medication Sig Start Date End Date Taking? Authorizing Provider  fluticasone (FLONASE) 50 MCG/ACT nasal spray Place 1 spray into both nostrils daily.   Yes [provider]  Fluticasone-Salmeterol (ADVAIR) 250-50 MCG/DOSE AEPB Inhale 1 puff into the lungs 2 (two) times daily.   Yes [provider]  linaclotide (LINZESS) 72 MCG capsule Take 72 mcg by mouth daily before breakfast.   Yes [provider]  oxyCODONE (OXYCONTIN) 20 mg 12 hr tablet Take 20 mg by mouth every 12 (twelve) hours.   Yes [provider]  PANCREAZE 58099 units CPEP TAKE ONE CAPSULE BY MOUTH 3 TIMES A DAY 02/01/18  Yes [provider]  ranitidine (ZANTAC) 75 MG tablet Take 75 mg by mouth 2 (two) times daily.   Yes [provider]  tiotropium (SPIRIVA) 18 MCG inhalation capsule Place 18 mcg into inhaler and inhale daily.   Yes [provider]  VENTOLIN HFA 108 (90 Base) MCG/ACT inhaler 2 INHALATION EVERY FOUR HOURS AS NEEDED 02/25/18  Yes [provider]  benzonatate (TESSALON PERLES) 100 MG capsule Take 1 capsule (100 mg total) by mouth 3 (three) times daily as  needed for cough. 03/30/18   Marylene Land, NP  doxycycline (VIBRA-TABS) 100 MG tablet Take 1 tablet (100 mg total) by mouth 2 (two) times daily. 01/19/20   Norval Gable, MD  ibuprofen (ADVIL,MOTRIN) 600 MG tablet Take 1 tablet (600 mg total) by mouth every 6 (six) hours as needed. 04/17/18   Melynda Ripple, MD  predniSONE (DELTASONE) 20 MG tablet Take 2 tablets (40 mg total) by mouth daily. 03/30/18   Marylene Land, NP    Family History Family History  Problem Relation Age of Onset  . Lung cancer Mother   . Pancreatic disease Father     Social History Social History   Tobacco Use  . Smoking status: Former Smoker    Types: Cigarettes  . Smokeless tobacco: Never Used  Substance Use Topics  . Alcohol use: No    Alcohol/week: 0.0 standard drinks  . Drug use: No     Allergies   Zithromax [azithromycin], Levaquin [levofloxacin], and Penicillins   Review of Systems Review of Systems   Physical Exam Triage Vital Signs ED Triage Vitals  Enc Vitals Group     BP 01/19/20 1722 (!) 143/93     Pulse Rate 01/19/20 1722 100     Resp 01/19/20 1722 17     Temp 01/19/20 1722 98.1 F (36.7 C)     Temp Source 01/19/20 1722 Oral  SpO2 01/19/20 1722 96 %     Weight 01/19/20 1718 239 lb (108.4 kg)     Height 01/19/20 1718 6' (1.829 m)     Head Circumference --      Peak Flow --      Pain Score 01/19/20 1717 6     Pain Loc --      Pain Edu? --      Excl. in [place]? --    No data found.  Updated Vital Signs BP (!) 143/93 (BP Location: Left Arm)   Pulse 100   Temp 98.1 F (36.7 C) (Oral)   Resp 17   Ht 6' (1.829 m)   Wt 108.4 kg   SpO2 96%   BMI 32.41 kg/m   Visual Acuity Right Eye Distance:   Left Eye Distance:   Bilateral Distance:    Right Eye Near:   Left Eye Near:    Bilateral Near:     Physical Exam Vitals and nursing note reviewed.  Constitutional:      General: He is not in acute distress.    Appearance: He is not toxic-appearing or diaphoretic.    HENT:     Right Ear: Tympanic membrane normal.     Left Ear: Tympanic membrane normal.     Nose: Congestion present.     Right Sinus: Maxillary sinus tenderness and frontal sinus tenderness present.     Left Sinus: Maxillary sinus tenderness and frontal sinus tenderness present.  Eyes:     Conjunctiva/sclera: Conjunctivae normal.  Cardiovascular:     Rate and Rhythm: Tachycardia present.     Heart sounds: Normal heart sounds.  Pulmonary:     Effort: Pulmonary effort is normal. No respiratory distress.     Breath sounds: No stridor. Wheezing (diffuse, mild) present. No rhonchi or rales.  Neurological:     Mental Status: He is alert.      UC Treatments / Results  Labs (all labs ordered are listed, but only abnormal results are displayed) Labs Reviewed  NOVEL CORONAVIRUS, NAA (HOSP ORDER, SEND-OUT TO REF LAB; TAT 18-24 HRS)    EKG   Radiology No results found.  Procedures Procedures (including critical care time)  Medications Ordered in UC Medications - No data to display  Initial Impression / Assessment and Plan / UC Course  I have reviewed the triage vital signs and the nursing notes.  Pertinent labs & imaging results that were available during my care of the patient were reviewed by me and considered in my medical decision making (see chart for details).      Final Clinical Impressions(s) / UC Diagnoses   Final diagnoses:  Acute maxillary sinusitis, recurrence not specified  Acute frontal sinusitis, recurrence not specified  Wheezing     Discharge Instructions     Over the counter steroid nasal spray Continue home inhaler    ED Prescriptions    Medication Sig Dispense Auth. Provider   doxycycline (VIBRA-TABS) 100 MG tablet Take 1 tablet (100 mg total) by mouth 2 (two) times daily. 20 tablet Payton Mccallum, MD      1. diagnosis reviewed with patient 2. rx as per orders above; reviewed possible side effects, interactions, risks and benefits  3.  Recommend supportive treatment as above 4. covid test done 5. Follow-up prn if symptoms worsen or don't improve  PDMP not reviewed this encounter.   Payton Mccallum, MD 01/19/20 1806

## 2020-01-20 LAB — NOVEL CORONAVIRUS, NAA (HOSP ORDER, SEND-OUT TO REF LAB; TAT 18-24 HRS): SARS-CoV-2, NAA: NOT DETECTED

## 2020-03-09 ENCOUNTER — Ambulatory Visit: Payer: Medicare Other

## 2020-03-09 ENCOUNTER — Ambulatory Visit
Admission: EM | Admit: 2020-03-09 | Discharge: 2020-03-09 | Disposition: A | Discharge status: Home / Self Care | Payer: Medicare Other | Attending: Family Medicine | Admitting: Family Medicine

## 2020-03-09 ENCOUNTER — Other Ambulatory Visit: Payer: Self-pay

## 2020-03-09 DIAGNOSIS — J4521 Mild intermittent asthma with (acute) exacerbation: Secondary | ICD-10-CM | POA: Insufficient documentation

## 2020-03-09 MED ORDER — PREDNISONE 20 MG PO TABS: ORAL_TABLET | ORAL | 0 refills | Status: DC

## 2020-03-09 NOTE — ED Provider Notes (Signed)
[facility]    CSN: [id number] Arrival date & time: 03/09/20  0959      History   Chief Complaint Chief Complaint  Patient presents with  . Shortness of Breath    HPI Eugene Blackburn is a 49 y.o. male.   49 yo male with a c/o chest congestion, cough, wheezing and shortness of breath for one month. States he's been using his albuterol inhaler more frequently. Denies any fevers, chills, chest pains.  Was treated for sinusitis about 6 weeks ago.    Shortness of Breath   Past Medical History:  Diagnosis Date  . Asthma   . Degenerative disorder of muscle   . Pancreas (digestive gland) works poorly     There are no problems to display for this patient.   Past Surgical History:  Procedure Laterality Date  . PANCREAS SURGERY         Home Medications    Prior to Admission medications   Medication Sig Start Date End Date Taking? Authorizing Provider  fluticasone (FLONASE) 50 MCG/ACT nasal spray Place 1 spray into both nostrils daily.   Yes [provider]  Fluticasone-Salmeterol (ADVAIR) 250-50 MCG/DOSE AEPB Inhale 1 puff into the lungs 2 (two) times daily.   Yes [provider]  ibuprofen (ADVIL,MOTRIN) 600 MG tablet Take 1 tablet (600 mg total) by mouth every 6 (six) hours as needed. 04/17/18  Yes Domenick Gong, MD  linaclotide [facility]) 72 MCG capsule Take 72 mcg by mouth daily before breakfast.   Yes [provider]  oxyCODONE (OXYCONTIN) 20 mg 12 hr tablet Take 30 mg by mouth every 12 (twelve) hours.    Yes [provider]  PANCREAZE 40981 units CPEP TAKE ONE CAPSULE BY MOUTH 3 TIMES A DAY 02/01/18  Yes [provider]  ranitidine (ZANTAC) 75 MG tablet Take 75 mg by mouth 2 (two) times daily.   Yes [provider]  umeclidinium bromide (INCRUSE ELLIPTA) 62.5 MCG/INH AEPB Inhale 1 puff into the lungs daily.   Yes [provider]  VENTOLIN HFA 108 (90 Base) MCG/ACT inhaler 2 INHALATION EVERY  FOUR HOURS AS NEEDED 02/25/18  Yes [provider]  benzonatate (TESSALON PERLES) 100 MG capsule Take 1 capsule (100 mg total) by mouth 3 (three) times daily as needed for cough. 03/30/18   Renford Dills, NP  doxycycline (VIBRA-TABS) 100 MG tablet Take 1 tablet (100 mg total) by mouth 2 (two) times daily. 01/19/20   Payton Mccallum, MD  predniSONE (DELTASONE) 20 MG tablet 3 tabs po qd x 2 days, then 2 tabs po qd x 3 days, then 1 tab po qd x 3 days, then half a tab po qd x 2 days 03/09/20   Payton Mccallum, MD  tiotropium (SPIRIVA) 18 MCG inhalation capsule Place 18 mcg into inhaler and inhale daily.    [provider]    Family History Family History  Problem Relation Age of Onset  . Lung cancer Mother   . Pancreatic disease Father     Social History Social History   Tobacco Use  . Smoking status: Former Smoker    Types: Cigarettes  . Smokeless tobacco: Never Used  Substance Use Topics  . Alcohol use: No    Alcohol/week: 0.0 standard drinks  . Drug use: No     Allergies   Gabapentin, Zithromax [azithromycin], Levaquin [levofloxacin], and Penicillins   Review of Systems Review of Systems  Respiratory: Positive for shortness of breath.      Physical  Exam Triage Vital Signs ED Triage Vitals  Enc Vitals Group     BP 03/09/20 1015 119/78     Pulse Rate 03/09/20 1015 (!) 118     Resp 03/09/20 1015 18     Temp 03/09/20 1015 98.5 F (36.9 C)     Temp Source 03/09/20 1015 Oral     SpO2 03/09/20 1015 94 %     Weight 03/09/20 1012 240 lb (108.9 kg)     Height 03/09/20 1012 6' (1.829 m)     Head Circumference --      Peak Flow --      Pain Score 03/09/20 1011 0     Pain Loc --      Pain Edu? --      Excl. in [place]? --    No data found.  Updated Vital Signs BP 119/78 (BP Location: Left Arm)   Pulse (!) 118   Temp 98.5 F (36.9 C) (Oral)   Resp 18   Ht 6' (1.829 m)   Wt 108.9 kg   SpO2 94%   BMI 32.55 kg/m   Visual Acuity Right Eye Distance:   Left  Eye Distance:   Bilateral Distance:    Right Eye Near:   Left Eye Near:    Bilateral Near:     Physical Exam Vitals and nursing note reviewed.  Constitutional:      General: He is not in acute distress.    Appearance: He is not toxic-appearing or diaphoretic.  Cardiovascular:     Rate and Rhythm: Tachycardia present.     Heart sounds: Normal heart sounds.  Pulmonary:     Effort: Pulmonary effort is normal. No respiratory distress.     Breath sounds: No stridor. Wheezing (diffuse, bilaterally) present. No rhonchi or rales.  Neurological:     Mental Status: He is alert.      UC Treatments / Results  Labs (all labs ordered are listed, but only abnormal results are displayed) Labs Reviewed - No data to display  EKG   Radiology No results found.  Procedures Procedures (including critical care time)  Medications Ordered in UC Medications - No data to display  Initial Impression / Assessment and Plan / UC Course  I have reviewed the triage vital signs and the nursing notes.  Pertinent labs & imaging results that were available during my care of the patient were reviewed by me and considered in my medical decision making (see chart for details).      Final Clinical Impressions(s) / UC Diagnoses   Final diagnoses:  Exacerbation of intermittent asthma, unspecified asthma severity     Discharge Instructions     Continue albuterol  Add antihistamine daily    ED Prescriptions    Medication Sig Dispense Auth. Provider   predniSONE (DELTASONE) 20 MG tablet 3 tabs po qd x 2 days, then 2 tabs po qd x 3 days, then 1 tab po qd x 3 days, then half a tab po qd x 2 days 16 tablet Norval Gable, MD      1. Chest x-ray result (negative) and diagnosis reviewed with patient 2. rx as per orders above; reviewed possible side effects, interactions, risks and benefits  3. Recommend supportive treatment with increased water intake and as above 4. Follow-up prn if symptoms  worsen or don't improve   PDMP not reviewed this encounter.   Norval Gable, MD 03/09/20 [phone]

## 2020-03-09 NOTE — ED Triage Notes (Signed)
Pt presents with c/o chest congestion, productive cough with clear/brown mucus and shob. Pt denies fever/chills or other symptoms. Pt does have albuterol HFA and has been using that 4 times a day along with Mucinex with some improvement. Pt also uses Advair BID and Incruse QD.

## 2020-03-09 NOTE — Discharge Instructions (Signed)
Continue albuterol  Add antihistamine daily

## 2020-03-10 ENCOUNTER — Encounter: Payer: Self-pay | Admitting: Emergency Medicine

## 2020-03-10 ENCOUNTER — Emergency Department
Admission: EM | Admit: 2020-03-10 | Discharge: 2020-03-10 | Disposition: A | Discharge status: Home / Self Care | Payer: Medicare Other | Attending: Emergency Medicine | Admitting: Emergency Medicine

## 2020-03-10 ENCOUNTER — Other Ambulatory Visit: Payer: Self-pay

## 2020-03-10 DIAGNOSIS — J0101 Acute recurrent maxillary sinusitis: Secondary | ICD-10-CM | POA: Diagnosis not present

## 2020-03-10 DIAGNOSIS — J441 Chronic obstructive pulmonary disease with (acute) exacerbation: Secondary | ICD-10-CM | POA: Diagnosis not present

## 2020-03-10 DIAGNOSIS — Z87891 Personal history of nicotine dependence: Secondary | ICD-10-CM | POA: Insufficient documentation

## 2020-03-10 DIAGNOSIS — R0602 Shortness of breath: Secondary | ICD-10-CM | POA: Diagnosis present

## 2020-03-10 DIAGNOSIS — Z79899 Other long term (current) drug therapy: Secondary | ICD-10-CM | POA: Diagnosis not present

## 2020-03-10 LAB — BASIC METABOLIC PANEL
Anion gap: 10 (ref 5–15)
BUN: 12 mg/dL (ref 6–20)
CO2: 30 mmol/L (ref 22–32)
Calcium: 9.3 mg/dL (ref 8.9–10.3)
Chloride: 99 mmol/L (ref 98–111)
Creatinine, Ser: 1.15 mg/dL (ref 0.61–1.24)
GFR calc Af Amer: >60 mL/min (ref >60)
GFR calc non Af Amer: >60 mL/min (ref >60)
Glucose, Bld: 149 mg/dL — ABNORMAL HIGH (ref 70–99)
Potassium: 4 mmol/L (ref 3.5–5.1)
Sodium: 139 mmol/L (ref 135–145)

## 2020-03-10 LAB — CBC
HCT: 47.8 % (ref 39.0–52.0)
Hemoglobin: 15 g/dL (ref 13.0–17.0)
MCH: 31.3 pg (ref 26.0–34.0)
MCHC: 31.4 g/dL (ref 30.0–36.0)
MCV: 99.8 fL (ref 80.0–100.0)
Platelets: 265 10*3/uL (ref 150–400)
RBC: 4.79 MIL/uL (ref 4.22–5.81)
RDW: 14.8 % (ref 11.5–15.5)
WBC: 13.6 10*3/uL — ABNORMAL HIGH (ref 4.0–10.5)
nRBC: 0 % (ref 0.0–0.2)

## 2020-03-10 MED ORDER — IPRATROPIUM-ALBUTEROL 0.5-2.5 (3) MG/3ML IN SOLN
3.0000 mL | Freq: Once | RESPIRATORY_TRACT | Status: DC
  Filled 2020-03-10: qty 3

## 2020-03-10 MED ORDER — DOXYCYCLINE HYCLATE 100 MG PO CAPS: 100.0000 mg | ORAL_CAPSULE | Freq: Two times a day (BID) | ORAL | 0 refills | Status: AC

## 2020-03-10 MED ORDER — BENZONATATE 100 MG PO CAPS: 100.0000 mg | ORAL_CAPSULE | Freq: Three times a day (TID) | ORAL | 0 refills | Status: DC | PRN

## 2020-03-10 MED ORDER — IPRATROPIUM-ALBUTEROL 0.5-2.5 (3) MG/3ML IN SOLN
3.0000 mL | Freq: Once | RESPIRATORY_TRACT | Status: AC
  Administered 2020-03-10: 3 mL via RESPIRATORY_TRACT
  Filled 2020-03-10: qty 3

## 2020-03-10 MED ORDER — CEFDINIR 300 MG PO CAPS: 300.0000 mg | ORAL_CAPSULE | Freq: Two times a day (BID) | ORAL | 0 refills | Status: AC

## 2020-03-10 MED ORDER — METHYLPREDNISOLONE SODIUM SUCC 125 MG IJ SOLR
125.0000 mg | Freq: Once | INTRAMUSCULAR | Status: AC
  Administered 2020-03-10: 125 mg via INTRAVENOUS
  Filled 2020-03-10: qty 2

## 2020-03-10 NOTE — ED Triage Notes (Signed)
Pt arrived via POV with reports of increased shortness of breath at rest and on exertion. Pt difficulty speakign without running out of breath.  Pt declined wheelchair states sitting makes the shortness of breath worse.  Seen at [facility] yesterday started on steroids.  Uses nebs and inhalers at home, hx of COPD.

## 2020-03-10 NOTE — ED Notes (Signed)
Pt states he wants cipro as antibiotic, has used it before and not had any allergic reaction. Pt states "it works better for lung infections." Dr. Erma Heritage notified.

## 2020-03-10 NOTE — ED Provider Notes (Signed)
[facility] Emergency Department Provider Note  ____________________________________________   First MD Initiated Contact with Patient 03/10/20 1133     (approximate)  I have reviewed the triage vital signs and the nursing notes.   HISTORY  Chief Complaint Shortness of Breath    HPI Eugene Blackburn is a 49 y.o. male  Here with SOB. Pt reports he has a long h/o asthma, for which he has recurrent sinusitis and asthma exacerbations. He states that over the past week or so, he's had progressively worsening drainage from his noise, cough, sputum production, and SOB. He has been tested for COVID and is negative. He denies any fevers. He's had associated wheezing, increased WOB. He was seen at an UC yesterday and prescribed prednisone, as well as an inhaler which he has been using. His SOB has worsened throughout the day and he is now developing increasing sputum production, subjective chills, and malaise.         Past Medical History:  Diagnosis Date  . Asthma   . Degenerative disorder of muscle   . Pancreas (digestive gland) works poorly     There are no problems to display for this patient.   Past Surgical History:  Procedure Laterality Date  . PANCREAS SURGERY      Prior to Admission medications   Medication Sig Start Date End Date Taking? Authorizing Provider  benzonatate (TESSALON PERLES) 100 MG capsule Take 1 capsule (100 mg total) by mouth 3 (three) times daily as needed for cough. 03/30/18   Marylene Land, NP  benzonatate (TESSALON PERLES) 100 MG capsule Take 1 capsule (100 mg total) by mouth 3 (three) times daily as needed for cough. 03/10/20 03/10/21  Duffy Bruce, MD  cefdinir (OMNICEF) 300 MG capsule Take 1 capsule (300 mg total) by mouth 2 (two) times daily for 7 days. 03/10/20 03/17/20  Duffy Bruce, MD  doxycycline (VIBRAMYCIN) 100 MG capsule Take 1 capsule (100 mg total) by mouth 2 (two) times daily for 7 days. 03/10/20 03/17/20  Duffy Bruce, MD  fluticasone (FLONASE) 50 MCG/ACT nasal spray Place 1 spray into both nostrils daily.    [provider]  Fluticasone-Salmeterol (ADVAIR) 250-50 MCG/DOSE AEPB Inhale 1 puff into the lungs 2 (two) times daily.    [provider]  ibuprofen (ADVIL,MOTRIN) 600 MG tablet Take 1 tablet (600 mg total) by mouth every 6 (six) hours as needed. 04/17/18   Melynda Ripple, MD  linaclotide [facility]) 72 MCG capsule Take 72 mcg by mouth daily before breakfast.    [provider]  oxyCODONE (OXYCONTIN) 20 mg 12 hr tablet Take 30 mg by mouth every 12 (twelve) hours.     [provider]  PANCREAZE 51761 units CPEP TAKE ONE CAPSULE BY MOUTH 3 TIMES A DAY 02/01/18   [provider]  predniSONE (DELTASONE) 20 MG tablet 3 tabs po qd x 2 days, then 2 tabs po qd x 3 days, then 1 tab po qd x 3 days, then half a tab po qd x 2 days 03/09/20   Norval Gable, MD  ranitidine (ZANTAC) 75 MG tablet Take 75 mg by mouth 2 (two) times daily.    [provider]  tiotropium (SPIRIVA) 18 MCG inhalation capsule Place 18 mcg into inhaler and inhale daily.    [provider]  umeclidinium bromide (INCRUSE ELLIPTA) 62.5 MCG/INH AEPB Inhale 1 puff into the lungs daily.    [provider]  VENTOLIN HFA 108 (90 Base) MCG/ACT inhaler 2 INHALATION  EVERY FOUR HOURS AS NEEDED 02/25/18   [provider]    Allergies Gabapentin, Zithromax [azithromycin], Levaquin [levofloxacin], Penicillin g, and Penicillins  Family History  Problem Relation Age of Onset  . Lung cancer Mother   . Pancreatic disease Father     Social History Social History   Tobacco Use  . Smoking status: Former Smoker    Types: Cigarettes  . Smokeless tobacco: Never Used  Substance Use Topics  . Alcohol use: No    Alcohol/week: 0.0 standard drinks  . Drug use: No    Review of Systems  Review of Systems  Constitutional: Positive for fatigue. Negative for chills and fever.    HENT: Negative for sore throat.   Respiratory: Positive for cough, shortness of breath and wheezing.   Cardiovascular: Negative for chest pain.  Gastrointestinal: Negative for abdominal pain.  Genitourinary: Negative for flank pain.  Musculoskeletal: Negative for neck pain.  Skin: Negative for rash and wound.  Allergic/Immunologic: Negative for immunocompromised state.  Neurological: Negative for weakness and numbness.  Hematological: Does not bruise/bleed easily.  All other systems reviewed and are negative.    ____________________________________________  PHYSICAL EXAM:      VITAL SIGNS: ED Triage Vitals  Enc Vitals Group     BP 03/10/20 1054 123/77     Pulse Rate 03/10/20 1054 (!) 114     Resp 03/10/20 1054 18     Temp 03/10/20 1054 99.3 F (37.4 C)     Temp Source 03/10/20 1054 Oral     SpO2 03/10/20 1054 95 %     Weight 03/10/20 1056 240 lb (108.9 kg)     Height 03/10/20 1056 6' (1.829 m)     Head Circumference --      Peak Flow --      Pain Score 03/10/20 1054 3     Pain Loc --      Pain Edu? --      Excl. in [place]? --      Physical Exam Vitals and nursing note reviewed.  Constitutional:      General: He is not in acute distress.    Appearance: He is well-developed.  HENT:     Head: Normocephalic and atraumatic.  Eyes:     Conjunctiva/sclera: Conjunctivae normal.  Cardiovascular:     Rate and Rhythm: Regular rhythm. Tachycardia present.     Heart sounds: Normal heart sounds. No murmur. No friction rub.  Pulmonary:     Effort: Pulmonary effort is normal. Tachypnea present. No respiratory distress.     Breath sounds: Examination of the right-upper field reveals wheezing. Examination of the left-upper field reveals wheezing. Examination of the right-middle field reveals wheezing and rhonchi. Examination of the left-middle field reveals wheezing. Examination of the right-lower field reveals wheezing. Examination of the left-lower field reveals wheezing and  rhonchi. Wheezing and rhonchi present. No rales.  Abdominal:     General: There is no distension.     Palpations: Abdomen is soft.     Tenderness: There is no abdominal tenderness.  Musculoskeletal:     Cervical back: Neck supple.  Skin:    General: Skin is warm.     Capillary Refill: Capillary refill takes less than 2 seconds.  Neurological:     Mental Status: He is alert and oriented to person, place, and time.     Motor: No abnormal muscle tone.       ____________________________________________   LABS (all labs ordered are listed, but only abnormal results are  displayed)  Labs Reviewed  BASIC METABOLIC PANEL - Abnormal; Notable for the following components:      Result Value   Glucose, Bld 149 (*)    All other components within normal limits  CBC - Abnormal; Notable for the following components:   WBC 13.6 (*)    All other components within normal limits    ____________________________________________  EKG: Sinus tachycardia, VR 112. QRS 110, QTc 462. RSR' noted, no acute ST elevations or depressions noted. No signs of ischemia.   EKG Interpretation  Date/Time:  Sunday March 10 2020 10:59:54 EDT Ventricular Rate:  112 PR Interval:    QRS Duration: 110 QT Interval:  338 QTC Calculation: 462 R Axis:   88 Text Interpretation: Sinus tachycardia RSR' in V1 or V2, right VCD or RVH Probable anteroseptal infarct, old Baseline wander in lead(s) I III aVL ----------unconfirmed---------- Confirmed by UNCONFIRMED, DOCTOR ([id number]), editor Tamsen Snider [phone]) on 03/10/2020 6:32:55 PM       ________________________________________  RADIOLOGY All imaging, including plain films, CT scans, and ultrasounds, independently reviewed by me, and interpretations confirmed via formal radiology reads.  ED MD interpretation:   CXR reviewed from yesterday - no focal PNA  Official radiology report(s): No results  found.  ____________________________________________  PROCEDURES   Procedure(s) performed (including Critical Care):  Procedures  ____________________________________________  INITIAL IMPRESSION / MDM / ASSESSMENT AND PLAN / ED COURSE  As part of my medical decision making, I reviewed the following data within the electronic MEDICAL RECORD NUMBER Nursing notes reviewed and incorporated, Old chart reviewed, Notes from prior ED visits, and [place] Controlled Substance Database       *FARAAZ WOLIN was evaluated in Emergency Department on 03/10/2020 for the symptoms described in the history of present illness. He was evaluated in the context of the global COVID-19 pandemic, which necessitated consideration that the patient might be at risk for infection with the SARS-CoV-2 virus that causes COVID-19. Institutional protocols and algorithms that pertain to the evaluation of patients at risk for COVID-19 are in a state of rapid change based on information released by regulatory bodies including the CDC and federal and state organizations. These policies and algorithms were followed during the patient's care in the ED.  Some ED evaluations and interventions may be delayed as a result of limited staffing during the pandemic.*     Medical Decision Making:  49 yo M with PMHx asthma here with increasing cough, wheezing, and SOB. He does have diffuse wheezes on exam and reports increasing sputum production, chills. Moderate leukocytosis noted on CBC, which is likely combination of acute inflammation/infection and steroid effect. Given his worsening sputum production, focal lung findings, chills, and sx consistent with developing CAP, as well as h/o recurrent similar episodes with good response to ABX, will cover empirically. He has multiple allergies but has tolerated cephalosporins and doxy in the past, will prescribe omnicef/doxy and have him continue steroids, regular nebs at home. He is otherwise welll  appearing, satting well on RA, and in NAD.  ____________________________________________  FINAL CLINICAL IMPRESSION(S) / ED DIAGNOSES  Final diagnoses:  COPD exacerbation (HCC)  Acute recurrent maxillary sinusitis     MEDICATIONS GIVEN DURING THIS VISIT:  Medications  methylPREDNISolone sodium succinate (SOLU-MEDROL) 125 mg/2 mL injection 125 mg (125 mg Intravenous Given 03/10/20 1247)  ipratropium-albuterol (DUONEB) 0.5-2.5 (3) MG/3ML nebulizer solution 3 mL (3 mLs Nebulization Given 03/10/20 1247)     ED Discharge Orders         Ordered  doxycycline (VIBRAMYCIN) 100 MG capsule  2 times daily     03/10/20 1240    benzonatate (TESSALON PERLES) 100 MG capsule  3 times daily PRN     03/10/20 1240    cefdinir (OMNICEF) 300 MG capsule  2 times daily     03/10/20 1429           Note:  This document was prepared using Dragon voice recognition software and may include unintentional dictation errors.   Shaune Pollack, MD 03/10/20 2021

## 2020-03-10 NOTE — ED Notes (Signed)
Pt concerned with the number of breathing treatments for him ordered, states, "i've done my research and I know that too many treatments isn't a good thing." attempt to reassure pt about receiving the number of duoneb treatments is routine for exacerbation. Pt states, "i'm pretty sure I have the same treatments at home and I can follow up with those if I need them." Dr Erma Heritage aware

## 2020-09-12 ENCOUNTER — Other Ambulatory Visit: Payer: Self-pay

## 2020-09-12 ENCOUNTER — Ambulatory Visit (INDEPENDENT_AMBULATORY_CARE_PROVIDER_SITE_OTHER): Payer: Medicare Other

## 2020-09-12 ENCOUNTER — Ambulatory Visit
Admission: EM | Admit: 2020-09-12 | Discharge: 2020-09-12 | Disposition: A | Discharge status: Home / Self Care | Payer: Medicare Other | Attending: Family Medicine | Admitting: Family Medicine

## 2020-09-12 DIAGNOSIS — M659 Synovitis and tenosynovitis, unspecified: Secondary | ICD-10-CM | POA: Diagnosis not present

## 2020-09-12 DIAGNOSIS — M25521 Pain in right elbow: Secondary | ICD-10-CM

## 2020-09-12 DIAGNOSIS — M25421 Effusion, right elbow: Secondary | ICD-10-CM

## 2020-09-12 MED ORDER — METHYLPREDNISOLONE 4 MG PO TBPK: ORAL_TABLET | ORAL | 0 refills | Status: DC

## 2020-09-12 NOTE — Discharge Instructions (Signed)
Use the sling as necessary to provide comfort.  Sitting sleeping in a semireclined position such as a recliner until your elbow is feeling improved.  I recommend that you follow-up with orthopedic surgery in the next week for further evaluation.  Try to maintain range of motion several times each hour.

## 2020-09-12 NOTE — ED Provider Notes (Signed)
[facility]    CSN: [id number] Arrival date & time: 09/12/20  4132      History   Chief Complaint Chief Complaint  Patient presents with  . Elbow Pain    right    HPI Eugene Blackburn is a 49 y.o. male.   HPI  49 year old male presents today with right lateral elbow swelling started approximate 3 weeks ago.  He does not remember any specific injury.  Noticed that it is difficult to move his arms range of motion comfortably.  Area is very exquisitely tender.  He will , on occasion, have radiation of pain into his hand and specifically his index and middle fingers.  He is left hand dominant       Past Medical History:  Diagnosis Date  . Asthma   . Degenerative disorder of muscle   . Pancreas (digestive gland) works poorly     There are no problems to display for this patient.   Past Surgical History:  Procedure Laterality Date  . PANCREAS SURGERY         Home Medications    Prior to Admission medications   Medication Sig Start Date End Date Taking? Authorizing Provider  fluticasone (FLONASE) 50 MCG/ACT nasal spray Place 1 spray into both nostrils daily.   Yes [provider]  Fluticasone-Salmeterol (ADVAIR) 250-50 MCG/DOSE AEPB Inhale 1 puff into the lungs 2 (two) times daily.   Yes [provider]  ibuprofen (ADVIL,MOTRIN) 600 MG tablet Take 1 tablet (600 mg total) by mouth every 6 (six) hours as needed. 04/17/18  Yes Domenick Gong, MD  linaclotide [facility]) 72 MCG capsule Take 72 mcg by mouth daily before breakfast.   Yes [provider]  oxyCODONE (OXYCONTIN) 20 mg 12 hr tablet Take 30 mg by mouth every 12 (twelve) hours.    Yes [provider]  PANCREAZE 44010 units CPEP TAKE ONE CAPSULE BY MOUTH 3 TIMES A DAY 02/01/18  Yes [provider]  tiotropium (SPIRIVA) 18 MCG inhalation capsule Place 18 mcg into inhaler and inhale daily.   Yes [provider]  VENTOLIN HFA 108 (90 Base) MCG/ACT  inhaler 2 INHALATION EVERY FOUR HOURS AS NEEDED 02/25/18  Yes [provider]  methylPREDNISolone (MEDROL DOSEPAK) 4 MG TBPK tablet Take per package instructions 09/12/20   Lutricia Feil, PA-C  ranitidine (ZANTAC) 75 MG tablet Take 75 mg by mouth 2 (two) times daily.    [provider]    Family History Family History  Problem Relation Age of Onset  . Lung cancer Mother   . Pancreatic disease Father     Social History Social History   Tobacco Use  . Smoking status: Former Smoker    Types: Cigarettes  . Smokeless tobacco: Never Used  Vaping Use  . Vaping Use: Former  Substance Use Topics  . Alcohol use: No    Alcohol/week: 0.0 standard drinks  . Drug use: No     Allergies   Gabapentin, Zithromax [azithromycin], Levaquin [levofloxacin], Penicillin g, and Penicillins   Review of Systems Review of Systems  Constitutional: Positive for activity change. Negative for appetite change, chills, diaphoresis, fatigue and fever.  Musculoskeletal: Positive for arthralgias and joint swelling. Negative for neck pain and neck stiffness.  All other systems reviewed and are negative.    Physical Exam Triage Vital Signs ED Triage Vitals  Enc Vitals Group     BP 09/12/20 0853 113/70     Pulse Rate 09/12/20 0853 (!) 102  Resp 09/12/20 0853 17     Temp 09/12/20 0853 98.5 F (36.9 C)     Temp Source 09/12/20 0853 Oral     SpO2 09/12/20 0853 95 %     Weight 09/12/20 0852 225 lb (102.1 kg)     Height 09/12/20 0852 6' (1.829 m)     Head Circumference --      Peak Flow --      Pain Score 09/12/20 0851 4     Pain Loc --      Pain Edu? --      Excl. in GC? --    No data found.  Updated Vital Signs BP 113/70 (BP Location: Left Arm)   Pulse (!) 102   Temp 98.5 F (36.9 C) (Oral)   Resp 17   Ht 6' (1.829 m)   Wt 225 lb (102.1 kg)   SpO2 95%   BMI 30.52 kg/m   Visual Acuity Right Eye Distance:   Left Eye Distance:   Bilateral Distance:    Right Eye  Near:   Left Eye Near:    Bilateral Near:     Physical Exam Vitals and nursing note reviewed.  Constitutional:      General: He is not in acute distress.    Appearance: Normal appearance. He is not ill-appearing or toxic-appearing.  HENT:     Head: Normocephalic and atraumatic.  Eyes:     Conjunctiva/sclera: Conjunctivae normal.  Musculoskeletal:        General: Swelling and tenderness present.     Comments: Examination of the nondominant right elbow shows flexion to 30 degrees from neutral very painful patient resists further flexion.  Extension 45 degrees neutral.  Patient is supination limited with supination to 15 pronation 60 degrees.  He does have a mild prominence over the radial head which is exquisitely tender to palpation.  He has decreased flexion extension of the wrist with tenderness localized over the elbow.  Neurovascular appears intact to light touch  Skin:    General: Skin is warm and dry.  Neurological:     General: No focal deficit present.     Mental Status: He is alert and oriented to person, place, and time.  Psychiatric:        Mood and Affect: Mood normal.        Behavior: Behavior normal.        Thought Content: Thought content normal.        Judgment: Judgment normal.      UC Treatments / Results  Labs (all labs ordered are listed, but only abnormal results are displayed) Labs Reviewed - No data to display  EKG   Radiology DG Elbow Complete Right  Result Date: 09/12/2020 CLINICAL DATA:  Acute right elbow pain and swelling without known injury. EXAM: RIGHT ELBOW - COMPLETE 3+ VIEW COMPARISON:  None. FINDINGS: There is no evidence of fracture, dislocation, or joint effusion. There is no evidence of arthropathy or other focal bone abnormality. Soft tissues are unremarkable. IMPRESSION: Negative. Electronically Signed   By: Lupita Raider M.D.   On: 09/12/2020 09:55    Procedures Procedures (including critical care time)  Medications Ordered in  UC Medications - No data to display  Initial Impression / Assessment and Plan / UC Course  I have reviewed the triage vital signs and the nursing notes.  Pertinent labs & imaging results that were available during my care of the patient were reviewed by me and considered in my medical decision  making (see chart for details).   49 year old male presents with a week history of nondominant lateral elbow pain with radiation into his hand affecting his middle and ring fingers on occasion.  Noticed a decreased range of motion due to discomfort.  He has noticed an increasing prominence of the radial head on the right which also is very tender to the touch.  He does not have any history of injury.  He has not had any overuse of the elbow.  Physical exam decreased range of motion of the elbow and wrist.  Sensation and supply were normal.  X-rays were obtained which were independently by me initially and then confirmed by radiology showing no fracture dislocation or other abnormality of the osseous structures.  Review of his past medical history was significant for degenerative disorder of muscle of unknown exact diagnosis.  Current working diagnosis is that of tenosynovitis of the elbow because of the negative x-ray and of the comfort with range of motion in the decreased range of motion.  I have recommended that he be placed in a sling but to maintain his range of motion is much as practical.  Also place him on Medrol Dosepak in the interim.  He was encouraged to sleep in a semireclining position.  Also recommended that he follow-up with orthopedics and name and phone number of [facility] was provided to the patient.   Final Clinical Impressions(s) / UC Diagnoses   Final diagnoses:  Tenosynovitis of elbow     Discharge Instructions     Use the sling as necessary to provide comfort.  Sitting sleeping in a semireclined position such as a recliner until your elbow is feeling improved.  I  recommend that you follow-up with orthopedic surgery in the next week for further evaluation.  Try to maintain range of motion several times each hour.    ED Prescriptions    Medication Sig Dispense Auth. Provider   methylPREDNISolone (MEDROL DOSEPAK) 4 MG TBPK tablet Take per package instructions 21 tablet Lutricia Feil, PA-C     PDMP not reviewed this encounter.   Lutricia Feil, PA-C 09/12/20 1530

## 2020-09-12 NOTE — ED Triage Notes (Signed)
Patient complains of right arm swelling near his elbow that started around 3 weeks ago. States that he has not had any injury but the area is painful.

## 2020-09-12 NOTE — ED Notes (Signed)
Large sling applied to left arm.

## 2020-11-10 ENCOUNTER — Other Ambulatory Visit: Payer: Self-pay

## 2020-11-10 ENCOUNTER — Ambulatory Visit
Admission: EM | Admit: 2020-11-10 | Discharge: 2020-11-10 | Disposition: A | Discharge status: Home / Self Care | Payer: Medicare Other | Attending: Physician Assistant | Admitting: Physician Assistant

## 2020-11-10 ENCOUNTER — Encounter: Payer: Self-pay | Admitting: Emergency Medicine

## 2020-11-10 DIAGNOSIS — R300 Dysuria: Secondary | ICD-10-CM | POA: Insufficient documentation

## 2020-11-10 DIAGNOSIS — R102 Pelvic and perineal pain: Secondary | ICD-10-CM | POA: Diagnosis not present

## 2020-11-10 DIAGNOSIS — R1031 Right lower quadrant pain: Secondary | ICD-10-CM | POA: Diagnosis not present

## 2020-11-10 LAB — URINALYSIS, COMPLETE (UACMP) WITH MICROSCOPIC
Bacteria, UA: NONE SEEN
Glucose, UA: NEGATIVE mg/dL
Hgb urine dipstick: NEGATIVE
Leukocytes,Ua: NEGATIVE
Nitrite: NEGATIVE
Protein, ur: NEGATIVE mg/dL
RBC / HPF: NONE SEEN RBC/hpf (ref 0–5)
Specific Gravity, Urine: 1.025 (ref 1.005–1.030)
WBC, UA: NONE SEEN WBC/hpf (ref 0–5)
pH: 6.5 (ref 5.0–8.0)

## 2020-11-10 NOTE — Discharge Instructions (Signed)
Your urine does not show any signs of infection and Irritable bowel does not cause bladder and right lower quadrant pain. I am concerned about your appendix which needs to be further evaluated, but since you declined the blood work, please go to ER if you get worse. Your bladder symptoms could be due to inflammation of your bladder called Interstitial cystitis. Avoid foods that could provoke bladder inflammation.

## 2020-11-10 NOTE — ED Provider Notes (Signed)
[facility]    CSN: [id number] Arrival date & time: 11/10/20  1403      History   Chief Complaint Chief Complaint  Patient presents with  . Dysuria    HPI LOGIN Eugene Blackburn is a 49 y.o. male who presents with onset of dysuria, and bladder pressure which started 3 days ago. Has more frequency at night time. Denies constipation, unable to empty bladder fully, or decreased urine stream. Has similar symptoms in the past and was told he had a UTI. Has a burning sensation over suprapubic region.     Past Medical History:  Diagnosis Date  . Asthma   . Degenerative disorder of muscle   . Pancreas (digestive gland) works poorly     There are no problems to display for this patient.   Past Surgical History:  Procedure Laterality Date  . PANCREAS SURGERY         Home Medications    Prior to Admission medications   Medication Sig Start Date End Date Taking? Authorizing Provider  fluticasone (FLONASE) 50 MCG/ACT nasal spray Place 1 spray into both nostrils daily.   Yes [provider]  Fluticasone-Salmeterol (ADVAIR) 250-50 MCG/DOSE AEPB Inhale 1 puff into the lungs 2 (two) times daily.   Yes [provider]  linaclotide (LINZESS) 72 MCG capsule Take 72 mcg by mouth daily before breakfast.   Yes [provider]  oxyCODONE (OXYCONTIN) 20 mg 12 hr tablet Take 30 mg by mouth every 12 (twelve) hours.    Yes [provider]  PANCREAZE 76808 units CPEP TAKE ONE CAPSULE BY MOUTH 3 TIMES A DAY 02/01/18  Yes [provider]  ranitidine (ZANTAC) 75 MG tablet Take 75 mg by mouth 2 (two) times daily.   Yes [provider]  tiotropium (SPIRIVA) 18 MCG inhalation capsule Place 18 mcg into inhaler and inhale daily.   Yes [provider]  VENTOLIN HFA 108 (90 Base) MCG/ACT inhaler 2 INHALATION EVERY FOUR HOURS AS NEEDED 02/25/18  Yes [provider]  ibuprofen (ADVIL,MOTRIN) 600 MG tablet Take 1 tablet (600 mg total)  by mouth every 6 (six) hours as needed. 04/17/18   Domenick Gong, MD  methylPREDNISolone (MEDROL DOSEPAK) 4 MG TBPK tablet Take per package instructions 09/12/20   Lutricia Feil, PA-C    Family History Family History  Problem Relation Age of Onset  . Lung cancer Mother   . Pancreatic disease Father     Social History Social History   Tobacco Use  . Smoking status: Former Smoker    Types: Cigarettes  . Smokeless tobacco: Never Used  Vaping Use  . Vaping Use: Former  Substance Use Topics  . Alcohol use: No    Alcohol/week: 0.0 standard drinks  . Drug use: No     Allergies   Gabapentin, Zithromax [azithromycin], Levaquin [levofloxacin], Penicillin g, and Penicillins   Review of Systems Review of Systems  Constitutional: Negative for chills, diaphoresis and fever.  Gastrointestinal: Positive for abdominal pain and nausea. Negative for abdominal distention, blood in stool, constipation, diarrhea and vomiting.       Over suprapubic area  Genitourinary: Positive for dysuria, frequency and urgency. Negative for difficulty urinating, discharge and flank pain.  Musculoskeletal: Negative for myalgias.  Skin: Negative for rash.     Physical Exam Triage Vital Signs ED Triage Vitals  Enc Vitals Group     BP 11/10/20 1416 123/70     Pulse Rate 11/10/20 1416 (!) 102  Resp 11/10/20 1416 16     Temp 11/10/20 1416 98.4 F (36.9 C)     Temp Source 11/10/20 1416 Oral     SpO2 11/10/20 1416 95 %     Weight 11/10/20 1412 221 lb (100.2 kg)     Height 11/10/20 1412 6' (1.829 m)     Head Circumference --      Peak Flow --      Pain Score 11/10/20 1412 6     Pain Loc --      Pain Edu? --      Excl. in GC? --    No data found.  Updated Vital Signs BP 123/70 (BP Location: Left Arm)   Pulse (!) 102   Temp 98.4 F (36.9 C) (Oral)   Resp 16   Ht 6' (1.829 m)   Wt 221 lb (100.2 kg)   SpO2 95%   BMI 29.97 kg/m   Visual Acuity Right Eye Distance:   Left Eye  Distance:   Bilateral Distance:    Right Eye Near:   Left Eye Near:    Bilateral Near:     Physical Exam Constitutional:      General: He is not in acute distress.    Appearance: He is obese. He is not toxic-appearing.  HENT:     Head: Normocephalic.     Right Ear: External ear normal.     Left Ear: External ear normal.  Eyes:     General: No scleral icterus.    Conjunctiva/sclera: Conjunctivae normal.  Pulmonary:     Effort: Pulmonary effort is normal.  Abdominal:     Palpations: Abdomen is soft.     Tenderness: There is no right CVA tenderness or left CVA tenderness.  Genitourinary:    Comments: He declined a prostate exam Musculoskeletal:        General: Normal range of motion.     Cervical back: Neck supple.  Skin:    General: Skin is warm and dry.  Neurological:     Mental Status: He is alert and oriented to person, place, and time.     Gait: Gait normal.  Psychiatric:        Mood and Affect: Mood normal.        Behavior: Behavior normal.        Thought Content: Thought content normal.        Judgment: Judgment normal.     UC Treatments / Results  Labs (all labs ordered are listed, but only abnormal results are displayed) Labs Reviewed  URINALYSIS, COMPLETE (UACMP) WITH MICROSCOPIC - Abnormal; Notable for the following components:      Result Value   Bilirubin Urine SMALL (*)    Ketones, ur TRACE (*)    All other components within normal limits    EKG   Radiology No results found.  Procedures Procedures (including critical care time)  Medications Ordered in UC Medications - No data to display  Initial Impression / Assessment and Plan / UC Course  I have reviewed the triage vital signs and the nursing notes. Dysuria and urinary frequency of unknown cause since UA is neg. May have IC.  He declined CBC with diff due to RLQ pain with guarding when I initially examined him, but before leaving he asked me to re-examine him since he did not have pain  there, and he was not tender or guarded during the second exam. See instructions.  Pertinent labs  results that were available during my care  of the patient were reviewed by me and considered in my medical decision making (see chart for details).   Final Clinical Impressions(s) / UC Diagnoses   Final diagnoses:  Dysuria  Suprapubic abdominal pain  Abdominal pain, acute, right lower quadrant     Discharge Instructions     Your urine does not show any signs of infection and Irritable bowel does not cause bladder and right lower quadrant pain. I am concerned about your appendix which needs to be further evaluated, but since you declined the blood work, please go to ER if you get worse. Your bladder symptoms could be due to inflammation of your bladder called Interstitial cystitis. Avoid foods that could provoke bladder inflammation.     ED Prescriptions    None     PDMP not reviewed this encounter.   Garey Ham, PA-C 11/10/20 1641

## 2020-11-10 NOTE — ED Triage Notes (Signed)
Patient c/o burning when urinating and bladder pressure that started 3 days ago.

## 2021-05-08 ENCOUNTER — Ambulatory Visit: Payer: Medicare Other | Admitting: Neurology

## 2021-05-19 ENCOUNTER — Encounter: Payer: Self-pay | Admitting: Emergency Medicine

## 2021-05-19 ENCOUNTER — Other Ambulatory Visit: Payer: Self-pay

## 2021-05-19 ENCOUNTER — Ambulatory Visit
Admission: EM | Admit: 2021-05-19 | Discharge: 2021-05-19 | Disposition: A | Discharge status: Home / Self Care | Payer: Medicare Other | Attending: Internal Medicine | Admitting: Internal Medicine

## 2021-05-19 DIAGNOSIS — J209 Acute bronchitis, unspecified: Secondary | ICD-10-CM

## 2021-05-19 DIAGNOSIS — R519 Headache, unspecified: Secondary | ICD-10-CM | POA: Diagnosis not present

## 2021-05-19 DIAGNOSIS — J040 Acute laryngitis: Secondary | ICD-10-CM | POA: Diagnosis not present

## 2021-05-19 MED ORDER — DOXYCYCLINE HYCLATE 100 MG PO CAPS: 100.0000 mg | ORAL_CAPSULE | Freq: Two times a day (BID) | ORAL | 0 refills | Status: DC

## 2021-05-19 MED ORDER — PREDNISONE 10 MG PO TABS: 20.0000 mg | ORAL_TABLET | Freq: Every day | ORAL | 0 refills | Status: DC

## 2021-05-19 NOTE — ED Provider Notes (Signed)
[facility]    CSN: [id number] Arrival date & time: 05/19/21  0909      History   Chief Complaint Chief Complaint  Patient presents with  . Facial Pain  . ear congestion    HPI Eugene Blackburn is a 50 y.o. male who presents with sinus pressure x 1 week. Started with allergies 2 weeks ago after mowing his grass.  Been taking Mucinex, Tylenol, tylenol allergy sinuses, Netie pot, Flonase, other cough med.  Denies fever or chills. Has had sweats.  Has a lot of post nasal drainage with yellow mucous. Has been coughing.  Denies hx of sinus surgeries . Had a negative covid test this am. Has hx of asthma and been using his inhaler.     Past Medical History:  Diagnosis Date  . Asthma   . Degenerative disorder of muscle   . Pancreas (digestive gland) works poorly     There are no problems to display for this patient.   Past Surgical History:  Procedure Laterality Date  . PANCREAS SURGERY         Home Medications    Prior to Admission medications   Medication Sig Start Date End Date Taking? Authorizing Provider  doxycycline (VIBRAMYCIN) 100 MG capsule Take 1 capsule (100 mg total) by mouth 2 (two) times daily. 05/19/21  Yes Rodriguez-Southworth, Nettie Elm, PA-C  fluticasone (FLONASE) 50 MCG/ACT nasal spray Place 1 spray into both nostrils daily.   Yes [provider]  Fluticasone-Salmeterol (ADVAIR) 250-50 MCG/DOSE AEPB Inhale 1 puff into the lungs 2 (two) times daily.   Yes [provider]  ibuprofen (ADVIL,MOTRIN) 600 MG tablet Take 1 tablet (600 mg total) by mouth every 6 (six) hours as needed. 04/17/18  Yes Domenick Gong, MD  linaclotide [facility]) 72 MCG capsule Take 72 mcg by mouth daily before breakfast.   Yes [provider]  oxyCODONE (OXYCONTIN) 20 mg 12 hr tablet Take 30 mg by mouth every 12 (twelve) hours.    Yes [provider]  PANCREAZE 37169 units CPEP TAKE ONE CAPSULE BY MOUTH 3 TIMES A DAY 02/01/18  Yes  [provider]  predniSONE (DELTASONE) 10 MG tablet Take 2 tablets (20 mg total) by mouth daily. 05/19/21  Yes Rodriguez-Southworth, Nettie Elm, PA-C  ranitidine (ZANTAC) 75 MG tablet Take 75 mg by mouth 2 (two) times daily.   Yes [provider]  tiotropium (SPIRIVA) 18 MCG inhalation capsule Place 18 mcg into inhaler and inhale daily.   Yes [provider]  VENTOLIN HFA 108 (90 Base) MCG/ACT inhaler 2 INHALATION EVERY FOUR HOURS AS NEEDED 02/25/18  Yes [provider]    Family History Family History  Problem Relation Age of Onset  . Lung cancer Mother   . Pancreatic disease Father     Social History Social History   Tobacco Use  . Smoking status: Former Smoker    Types: Cigarettes  . Smokeless tobacco: Never Used  Vaping Use  . Vaping Use: Former  Substance Use Topics  . Alcohol use: No    Alcohol/week: 0.0 standard drinks  . Drug use: No     Allergies   Gabapentin, Zithromax [azithromycin], Levaquin [levofloxacin], Penicillin g, and Penicillins   Review of Systems Review of Systems  Constitutional: Positive for diaphoresis. Negative for activity change, appetite change, chills, fatigue and fever.  HENT: Positive for congestion, postnasal drip, sinus pressure, sinus pain and voice change. Negative for ear discharge, ear pain, rhinorrhea, sore throat and trouble swallowing.  Eyes: Negative for discharge.  Respiratory: Positive for cough and wheezing. Negative for chest tightness.   Musculoskeletal: Negative for gait problem and myalgias.  Skin: Negative for rash.  Allergic/Immunologic: Positive for environmental allergies.  Neurological: Positive for headaches.  Hematological: Negative for adenopathy.     Physical Exam Triage Vital Signs ED Triage Vitals  Enc Vitals Group     BP 05/19/21 0929 110/74     Pulse Rate 05/19/21 0929 83     Resp 05/19/21 0929 18     Temp 05/19/21 0929 98.3 F (36.8 C)     Temp Source 05/19/21 0929  Oral     SpO2 05/19/21 0929 97 %     Weight 05/19/21 0928 227 lb (103 kg)     Height 05/19/21 0928 6' (1.829 m)     Head Circumference --      Peak Flow --      Pain Score 05/19/21 0928 0     Pain Loc --      Pain Edu? --      Excl. in GC? --    No data found.  Updated Vital Signs BP 110/74 (BP Location: Right Arm)   Pulse 83   Temp 98.3 F (36.8 C) (Oral)   Resp 18   Ht 6' (1.829 m)   Wt 227 lb (103 kg)   SpO2 97%   BMI 30.79 kg/m   Visual Acuity Right Eye Distance:   Left Eye Distance:   Bilateral Distance:    Right Eye Near:   Left Eye Near:    Bilateral Near:     Physical Exam Vitals and nursing note reviewed.  Constitutional:      General: He is not in acute distress.    Appearance: He is obese. He is not toxic-appearing.  HENT:     Head: Normocephalic.     Right Ear: Tympanic membrane, ear canal and external ear normal.     Left Ear: Tympanic membrane, ear canal and external ear normal.     Nose: Congestion present.     Comments: + sinus tenderness Eyes:     General: No scleral icterus.    Conjunctiva/sclera: Conjunctivae normal.  Cardiovascular:     Rate and Rhythm: Normal rate and regular rhythm.     Heart sounds: No murmur heard.   Pulmonary:     Effort: Pulmonary effort is normal.     Breath sounds: Wheezing present.  Musculoskeletal:        General: Normal range of motion.     Cervical back: Neck supple.  Skin:    General: Skin is warm and dry.     Findings: No rash.  Neurological:     Mental Status: He is alert and oriented to person, place, and time.     Gait: Gait normal.  Psychiatric:        Mood and Affect: Mood normal.        Behavior: Behavior normal.        Thought Content: Thought content normal.        Judgment: Judgment normal.      UC Treatments / Results  Labs (all labs ordered are listed, but only abnormal results are displayed) Labs Reviewed - No data to display  EKG   Radiology No results  found.  Procedures Procedures (including critical care time)  Medications Ordered in UC Medications - No data to display  Initial Impression / Assessment and Plan / UC Course  I have reviewed the triage  vital signs and the nursing notes. Has bronchitis and sinus headache. I placed him on Prednisone and Doxy as noted.  Final Clinical Impressions(s) / UC Diagnoses   Final diagnoses:  Acute bronchitis, unspecified organism  Sinus headache  Laryngitis   Discharge Instructions   None    ED Prescriptions    Medication Sig Dispense Auth. Provider   doxycycline (VIBRAMYCIN) 100 MG capsule Take 1 capsule (100 mg total) by mouth 2 (two) times daily. 20 capsule Rodriguez-Southworth, Nettie Elm, PA-C   predniSONE (DELTASONE) 10 MG tablet Take 2 tablets (20 mg total) by mouth daily. 5 tablet Rodriguez-Southworth, Nettie Elm, PA-C     PDMP not reviewed this encounter.   Garey Ham, [place] 05/19/21 [phone]

## 2021-05-19 NOTE — ED Triage Notes (Addendum)
Patient c/o facial pain and pressure, ear pressure that started 2 weeks ago. Patient states the air quality on Friday made his symptoms worse. Patient completed an at home COVID test this morning and this was negative.

## 2021-07-17 ENCOUNTER — Ambulatory Visit: Payer: Medicare Other | Admitting: Neurology

## 2021-08-07 ENCOUNTER — Encounter: Payer: Self-pay | Admitting: Neurology

## 2021-08-09 ENCOUNTER — Ambulatory Visit
Admission: EM | Admit: 2021-08-09 | Discharge: 2021-08-09 | Disposition: A | Discharge status: Home / Self Care | Payer: Medicare Other | Attending: Family Medicine | Admitting: Family Medicine

## 2021-08-09 ENCOUNTER — Other Ambulatory Visit: Payer: Self-pay

## 2021-08-09 ENCOUNTER — Encounter: Payer: Self-pay | Admitting: Emergency Medicine

## 2021-08-09 DIAGNOSIS — J209 Acute bronchitis, unspecified: Secondary | ICD-10-CM

## 2021-08-09 MED ORDER — PREDNISONE 50 MG PO TABS: ORAL_TABLET | ORAL | 0 refills | Status: DC

## 2021-08-09 MED ORDER — PROMETHAZINE-DM 6.25-15 MG/5ML PO SYRP: 5.0000 mL | ORAL_SOLUTION | Freq: Four times a day (QID) | ORAL | 0 refills | Status: DC | PRN

## 2021-08-09 MED ORDER — DOXYCYCLINE HYCLATE 100 MG PO CAPS: 100.0000 mg | ORAL_CAPSULE | Freq: Two times a day (BID) | ORAL | 0 refills | Status: DC

## 2021-08-09 NOTE — Discharge Instructions (Addendum)
Medication as prescribed.  Continue drinking lots of fluids. Stay as active as you can.  Take care  Dr. Adriana Simas

## 2021-08-09 NOTE — ED Triage Notes (Signed)
Patient states that he had COVID last month.  Patient states that he has ongoing cough, headache, sinus pain and pressure for the past 2 weeks.  Patient denies recent fevers.

## 2021-08-09 NOTE — ED Provider Notes (Signed)
[facility]    CSN: [id number] Arrival date & time: 08/09/21  1062      History   Chief Complaint Chief Complaint  Patient presents with  . Cough  . Facial Pain  . Headache    HPI 50 year old male presents with respiratory symptoms.  Patient reports that he had COVID-19 approximate 1 month ago.  He recovered and was doing well until approximately 2 weeks ago.  He states that he has had ear pain and pressure, sinus pain and pressure, severe coughing.  He reports associated diarrhea as well.  He feels very poorly.  He states he has been taking Mucinex, Robitussin, and Tylenol Cold and sinus without relief.  His chart reflects that he has a history of asthma and possibly COPD.   Past Medical History:  Diagnosis Date  . Asthma   . Chronic idiopathic constipation   . Chronic obstruct airways disease (HCC)   . Chronic pancreatitis (HCC)   . Degenerative disorder of muscle   . Hearing loss   . Hemorrhoids   . Hyperlipidemia   . Insomnia   . Male erectile dysfunction   . Nausea   . Pancreas (digestive gland) works poorly   . Seasonal allergic rhinitis   . Vitamin D deficiency     There are no problems to display for this patient.   Past Surgical History:  Procedure Laterality Date  . PANCREAS SURGERY         Home Medications    Prior to Admission medications   Medication Sig Start Date End Date Taking? Authorizing Provider  doxycycline (VIBRAMYCIN) 100 MG capsule Take 1 capsule (100 mg total) by mouth 2 (two) times daily. 08/09/21  Yes Edeline Greening G, DO  fluticasone (FLONASE) 50 MCG/ACT nasal spray Place 1 spray into both nostrils daily.   Yes [provider]  Fluticasone-Salmeterol (ADVAIR) 250-50 MCG/DOSE AEPB Inhale 1 puff into the lungs 2 (two) times daily.   Yes [provider]  linaclotide (LINZESS) 72 MCG capsule Take 72 mcg by mouth daily before breakfast.   Yes [provider]  PANCREAZE 69485 units CPEP TAKE ONE  CAPSULE BY MOUTH 3 TIMES A DAY 02/01/18  Yes [provider]  pantoprazole (PROTONIX) 40 MG tablet Take 40 mg by mouth daily.   Yes [provider]  predniSONE (DELTASONE) 50 MG tablet 1 tablet daily x 5 days 08/09/21  Yes Caileb Rhue G, DO  promethazine-dextromethorphan (PROMETHAZINE-DM) 6.25-15 MG/5ML syrup Take 5 mLs by mouth 4 (four) times daily as needed for cough. 08/09/21  Yes Keera Altidor G, DO  ranitidine (ZANTAC) 75 MG tablet Take 75 mg by mouth 2 (two) times daily.   Yes [provider]  tiotropium (SPIRIVA) 18 MCG inhalation capsule Place 18 mcg into inhaler and inhale daily.   Yes [provider]  VENTOLIN HFA 108 (90 Base) MCG/ACT inhaler 2 INHALATION EVERY FOUR HOURS AS NEEDED 02/25/18  Yes [provider]  ibuprofen (ADVIL,MOTRIN) 600 MG tablet Take 1 tablet (600 mg total) by mouth every 6 (six) hours as needed. 04/17/18   Domenick Gong, MD  oxyCODONE (OXYCONTIN) 20 mg 12 hr tablet Take 30 mg by mouth every 12 (twelve) hours.     [provider]    Family History Family History  Problem Relation Age of Onset  . Lung cancer Mother   . Pancreatic disease Father     Social History Social History   Tobacco Use  . Smoking status: Former  Types: Cigarettes  . Smokeless tobacco: Never  Vaping Use  . Vaping Use: Former  Substance Use Topics  . Alcohol use: No    Alcohol/week: 0.0 standard drinks  . Drug use: No     Allergies   Gabapentin, Zithromax [azithromycin], Levaquin [levofloxacin], Penicillin g, and Penicillins   Review of Systems Review of Systems Per HPI  Physical Exam Triage Vital Signs ED Triage Vitals  Enc Vitals Group     BP 08/09/21 0854 (!) 117/106     Pulse Rate 08/09/21 0854 95     Resp 08/09/21 0854 16     Temp 08/09/21 0854 98.6 F (37 C)     Temp Source 08/09/21 0854 Oral     SpO2 08/09/21 0854 96 %     Weight 08/09/21 0851 227 lb (103 kg)     Height 08/09/21 0851 6' (1.829 m)      Head Circumference --      Peak Flow --      Pain Score 08/09/21 0851 7     Pain Loc --      Pain Edu? --      Excl. in GC? --    Updated Vital Signs BP (!) 117/106 (BP Location: Left Arm)   Pulse 95   Temp 98.6 F (37 C) (Oral)   Resp 16   Ht 6' (1.829 m)   Wt 103 kg   SpO2 96%   BMI 30.79 kg/m   Visual Acuity Right Eye Distance:   Left Eye Distance:   Bilateral Distance:    Right Eye Near:   Left Eye Near:    Bilateral Near:     Physical Exam Constitutional:      General: He is not in acute distress.    Appearance: He is not ill-appearing.  HENT:     Head: Normocephalic and atraumatic.     Right Ear: Tympanic membrane normal.     Left Ear: Tympanic membrane normal.     Mouth/Throat:     Pharynx: Posterior oropharyngeal erythema present. No oropharyngeal exudate.  Eyes:     General:        Right eye: No discharge.        Left eye: No discharge.     Conjunctiva/sclera: Conjunctivae normal.  Cardiovascular:     Rate and Rhythm: Normal rate and regular rhythm.  Pulmonary:     Effort: Pulmonary effort is normal.     Comments: Left basilar crackles. Neurological:     Mental Status: He is alert.     UC Treatments / Results  Labs (all labs ordered are listed, but only abnormal results are displayed) Labs Reviewed - No data to display  EKG   Radiology No results found.  Procedures Procedures (including critical care time)  Medications Ordered in UC Medications - No data to display  Initial Impression / Assessment and Plan / UC Course  I have reviewed the triage vital signs and the nursing notes.  Pertinent labs & imaging results that were available during my care of the patient were reviewed by me and considered in my medical decision making (see chart for details).    50 year old male presents with respiratory infection.  This appears to be most consistent with acute bronchitis.  Placed on doxycycline, prednisone, and Promethazine DM.  Final  Clinical Impressions(s) / UC Diagnoses   Final diagnoses:  Acute bronchitis, unspecified organism     Discharge Instructions      Medication as prescribed.  Continue drinking  lots of fluids. Stay as active as you can.  Take care  Dr. Adriana Simas    ED Prescriptions     Medication Sig Dispense Auth. Provider   doxycycline (VIBRAMYCIN) 100 MG capsule Take 1 capsule (100 mg total) by mouth 2 (two) times daily. 14 capsule Borden Thune G, DO   predniSONE (DELTASONE) 50 MG tablet 1 tablet daily x 5 days 5 tablet Jaykwon Morones G, DO   promethazine-dextromethorphan (PROMETHAZINE-DM) 6.25-15 MG/5ML syrup Take 5 mLs by mouth 4 (four) times daily as needed for cough. 118 mL Everlene Other G, DO      I have reviewed the PDMP during this encounter.   Everlene Other [place], [place] 08/09/21 [phone]

## 2021-08-11 ENCOUNTER — Encounter: Payer: Self-pay | Admitting: Neurology

## 2021-08-11 ENCOUNTER — Ambulatory Visit: Payer: Medicare Other | Admitting: Neurology

## 2021-09-28 ENCOUNTER — Ambulatory Visit (INDEPENDENT_AMBULATORY_CARE_PROVIDER_SITE_OTHER): Payer: Medicare Other

## 2021-09-28 ENCOUNTER — Encounter: Payer: Self-pay | Admitting: Emergency Medicine

## 2021-09-28 ENCOUNTER — Other Ambulatory Visit: Payer: Self-pay

## 2021-09-28 ENCOUNTER — Ambulatory Visit
Admission: EM | Admit: 2021-09-28 | Discharge: 2021-09-28 | Disposition: A | Discharge status: Home / Self Care | Payer: Medicare Other | Attending: Emergency Medicine | Admitting: Emergency Medicine

## 2021-09-28 DIAGNOSIS — B9689 Other specified bacterial agents as the cause of diseases classified elsewhere: Secondary | ICD-10-CM

## 2021-09-28 DIAGNOSIS — J441 Chronic obstructive pulmonary disease with (acute) exacerbation: Secondary | ICD-10-CM

## 2021-09-28 DIAGNOSIS — R0602 Shortness of breath: Secondary | ICD-10-CM | POA: Diagnosis not present

## 2021-09-28 DIAGNOSIS — J019 Acute sinusitis, unspecified: Secondary | ICD-10-CM | POA: Diagnosis not present

## 2021-09-28 MED ORDER — DOXYCYCLINE HYCLATE 100 MG PO CAPS: 100.0000 mg | ORAL_CAPSULE | Freq: Two times a day (BID) | ORAL | 0 refills | Status: DC

## 2021-09-28 MED ORDER — PREDNISONE 10 MG (21) PO TBPK: ORAL_TABLET | Freq: Every day | ORAL | 0 refills | Status: DC

## 2021-09-28 MED ORDER — BENZONATATE 100 MG PO CAPS: 100.0000 mg | ORAL_CAPSULE | Freq: Three times a day (TID) | ORAL | 0 refills | Status: DC

## 2021-09-28 NOTE — ED Triage Notes (Signed)
Patient recently treated in [place] for cough and SOB.  Patient states that he was told he had the flu.  Patient c/o ongoing cough and SOB.  Patient denies fevers. Patient took home covid test this morning and was negative.

## 2021-09-28 NOTE — Discharge Instructions (Addendum)
Your chest x-ray today did not show any acute findings of infection or structural changes, therefore we will move forward today treating as a COPD flareup as well as covering for possible bacterial sinusitis   Starting tonight may begin doxycycline twice a day for 7 days  May use Tessalon pill every 8 hours as needed  Take prednisone every morning with food following instructions on package  May follow-up with urgent care or primary care doctor for persistent symptoms, at any point if your breathing becomes worse please go to the nearest emergency department for further evaluation

## 2021-09-28 NOTE — ED Provider Notes (Signed)
[facility]    CSN: [id number] Arrival date & time: 09/28/21  1255      History   Chief Complaint Chief Complaint  Patient presents with   Cough    HPI Eugene Blackburn is a 50 y.o. male.   Patient presents with productive cough,  increased shortness of breath and wheezing for at least 3 weeks.  Chest tightness has begun within the last 3 to 4 days, centrally located, does not radiate.  Symptoms began after a trip to [place], patient endorses very hot temperatures and poor air quality.  Seen in emergency department in [place], diagnosed with flu, given steroid injection but prescribed no outpatient medication.  Patient has been using inhalers and nebulizer treatments with no symptom relief.  History of asthma, COPD, history of tobacco abuse, hyperlipidemia, allergic rhinitis.  Concern today for possible pneumonia.   Past Medical History:  Diagnosis Date   Asthma    Chronic idiopathic constipation    Chronic obstruct airways disease (HCC)    Chronic pancreatitis (HCC)    Degenerative disorder of muscle    Hearing loss    Hemorrhoids    Hyperlipidemia    Insomnia    Male erectile dysfunction    Nausea    Pancreas (digestive gland) works poorly    Seasonal allergic rhinitis    Vitamin D deficiency     There are no problems to display for this patient.   Past Surgical History:  Procedure Laterality Date   PANCREAS SURGERY         Home Medications    Prior to Admission medications   Medication Sig Start Date End Date Taking? Authorizing Provider  fluticasone (FLONASE) 50 MCG/ACT nasal spray Place 1 spray into both nostrils daily.   Yes [provider]  Fluticasone-Salmeterol (ADVAIR) 250-50 MCG/DOSE AEPB Inhale 1 puff into the lungs 2 (two) times daily.   Yes [provider]  linaclotide (LINZESS) 72 MCG capsule Take 72 mcg by mouth daily before breakfast.   Yes [provider]  oxyCODONE (OXYCONTIN) 20 mg 12 hr tablet  Take 30 mg by mouth every 12 (twelve) hours.    Yes [provider]  PANCREAZE 61607 units CPEP TAKE ONE CAPSULE BY MOUTH 3 TIMES A DAY 02/01/18  Yes [provider]  pantoprazole (PROTONIX) 40 MG tablet Take 40 mg by mouth daily.   Yes [provider]  ranitidine (ZANTAC) 75 MG tablet Take 75 mg by mouth 2 (two) times daily.   Yes [provider]  tiotropium (SPIRIVA) 18 MCG inhalation capsule Place 18 mcg into inhaler and inhale daily.   Yes [provider]  VENTOLIN HFA 108 (90 Base) MCG/ACT inhaler 2 INHALATION EVERY FOUR HOURS AS NEEDED 02/25/18  Yes [provider]  doxycycline (VIBRAMYCIN) 100 MG capsule Take 1 capsule (100 mg total) by mouth 2 (two) times daily. 08/09/21   Tommie Sams, DO  ibuprofen (ADVIL,MOTRIN) 600 MG tablet Take 1 tablet (600 mg total) by mouth every 6 (six) hours as needed. 04/17/18   Domenick Gong, MD  predniSONE (DELTASONE) 50 MG tablet 1 tablet daily x 5 days 08/09/21   Tommie Sams, DO  promethazine-dextromethorphan (PROMETHAZINE-DM) 6.25-15 MG/5ML syrup Take 5 mLs by mouth 4 (four) times daily as needed for cough. 08/09/21   Tommie Sams, DO    Family History Family History  Problem Relation Age of Onset   Lung cancer Mother    Pancreatic disease Father     Social  History Social History   Tobacco Use   Smoking status: Former    Types: Cigarettes   Smokeless tobacco: Never  Vaping Use   Vaping Use: Former  Substance Use Topics   Alcohol use: No    Alcohol/week: 0.0 standard drinks   Drug use: No     Allergies   Gabapentin, Zithromax [azithromycin], Levaquin [levofloxacin], Penicillin g, and Penicillins   Review of Systems Review of Systems  Constitutional: Negative.   HENT: Negative.    Respiratory:  Positive for cough, chest tightness, shortness of breath and wheezing. Negative for apnea, choking and stridor.   Cardiovascular: Negative.   Gastrointestinal: Negative.   Skin: Negative.      Physical Exam Triage Vital Signs ED Triage Vitals  Enc Vitals Group     BP 09/28/21 1331 103/67     Pulse Rate 09/28/21 1331 76     Resp 09/28/21 1331 16     Temp 09/28/21 1331 98.4 F (36.9 C)     Temp Source 09/28/21 1331 Oral     SpO2 09/28/21 1331 95 %     Weight 09/28/21 1328 230 lb (104.3 kg)     Height 09/28/21 1328 6' (1.829 m)     Head Circumference --      Peak Flow --      Pain Score 09/28/21 1328 4     Pain Loc --      Pain Edu? --      Excl. in GC? --    No data found.  Updated Vital Signs BP 103/67 (BP Location: Left Arm)   Pulse 76   Temp 98.4 F (36.9 C) (Oral)   Resp 16   Ht 6' (1.829 m)   Wt 230 lb (104.3 kg)   SpO2 95%   BMI 31.19 kg/m   Visual Acuity Right Eye Distance:   Left Eye Distance:   Bilateral Distance:    Right Eye Near:   Left Eye Near:    Bilateral Near:     Physical Exam Constitutional:      Appearance: Normal appearance. He is normal weight.  HENT:     Head: Normocephalic.     Mouth/Throat:     Mouth: Mucous membranes are moist.     Pharynx: Oropharynx is clear.  Eyes:     Extraocular Movements: Extraocular movements intact.  Cardiovascular:     Rate and Rhythm: Normal rate and regular rhythm.     Pulses: Normal pulses.     Heart sounds: Normal heart sounds.  Pulmonary:     Effort: Pulmonary effort is normal.     Breath sounds: Wheezing present.  Skin:    General: Skin is warm and dry.  Neurological:     Mental Status: He is alert and oriented to person, place, and time. Mental status is at baseline.  Psychiatric:        Mood and Affect: Mood normal.        Behavior: Behavior normal.     UC Treatments / Results  Labs (all labs ordered are listed, but only abnormal results are displayed) Labs Reviewed - No data to display  EKG   Radiology No results found.  Procedures Procedures (including critical care time)  Medications Ordered in UC Medications - No data to display  Initial Impression /  Assessment and Plan / UC Course  I have reviewed the triage vital signs and the nursing notes.  Pertinent labs & imaging results that were available during my care of the  patient were reviewed by me and considered in my medical decision making (see chart for details).  COPD exacerbation Acute bacterial sinusitis  1.  Chest x-ray negative for acute findings 2.  Doxycycline 100 mg twice daily, several antibiotic allergies, reviewed 3.  Prednisone 60 mg taper 4.  Advised continued use of daily inhalers as well as albuterol as needed 5.  Return precautions given for persistent symptoms with follow-up in urgent care or with primary care provider, given strict return precautions for worsening signs of breathing to go to nearest emergency department for further evaluation Final Clinical Impressions(s) / UC Diagnoses   Final diagnoses:  None   Discharge Instructions   None    ED Prescriptions   None    PDMP not reviewed this encounter.   Valinda Hoar, NP 09/28/21 1437

## 2021-12-24 ENCOUNTER — Ambulatory Visit
Admission: EM | Admit: 2021-12-24 | Discharge: 2021-12-24 | Disposition: A | Discharge status: Home / Self Care | Payer: Medicare Other | Attending: Physician Assistant | Admitting: Physician Assistant

## 2021-12-24 ENCOUNTER — Other Ambulatory Visit: Payer: Self-pay

## 2021-12-24 DIAGNOSIS — R051 Acute cough: Secondary | ICD-10-CM | POA: Diagnosis not present

## 2021-12-24 DIAGNOSIS — R0602 Shortness of breath: Secondary | ICD-10-CM | POA: Diagnosis not present

## 2021-12-24 DIAGNOSIS — J441 Chronic obstructive pulmonary disease with (acute) exacerbation: Secondary | ICD-10-CM

## 2021-12-24 MED ORDER — DOXYCYCLINE HYCLATE 100 MG PO CAPS: 100.0000 mg | ORAL_CAPSULE | Freq: Two times a day (BID) | ORAL | 0 refills | Status: AC

## 2021-12-24 MED ORDER — PROMETHAZINE-DM 6.25-15 MG/5ML PO SYRP: 5.0000 mL | ORAL_SOLUTION | Freq: Four times a day (QID) | ORAL | 0 refills | Status: DC | PRN

## 2021-12-24 MED ORDER — PREDNISONE 20 MG PO TABS: 40.0000 mg | ORAL_TABLET | Freq: Every day | ORAL | 0 refills | Status: AC
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
## 2022-02-05 ENCOUNTER — Emergency Department
Admission: EM | Admit: 2022-02-05 | Discharge: 2022-02-05 | Disposition: A | Discharge status: Home / Self Care | Payer: Medicare Other | Attending: Emergency Medicine | Admitting: Emergency Medicine

## 2022-02-05 ENCOUNTER — Other Ambulatory Visit: Payer: Self-pay

## 2022-02-05 ENCOUNTER — Emergency Department: Payer: Medicare Other

## 2022-02-05 DIAGNOSIS — R197 Diarrhea, unspecified: Secondary | ICD-10-CM | POA: Insufficient documentation

## 2022-02-05 DIAGNOSIS — J45909 Unspecified asthma, uncomplicated: Secondary | ICD-10-CM | POA: Diagnosis not present

## 2022-02-05 DIAGNOSIS — R791 Abnormal coagulation profile: Secondary | ICD-10-CM | POA: Insufficient documentation

## 2022-02-05 DIAGNOSIS — Z8616 Personal history of COVID-19: Secondary | ICD-10-CM | POA: Diagnosis not present

## 2022-02-05 DIAGNOSIS — R7989 Other specified abnormal findings of blood chemistry: Secondary | ICD-10-CM

## 2022-02-05 DIAGNOSIS — I509 Heart failure, unspecified: Secondary | ICD-10-CM | POA: Diagnosis not present

## 2022-02-05 DIAGNOSIS — J9601 Acute respiratory failure with hypoxia: Secondary | ICD-10-CM | POA: Insufficient documentation

## 2022-02-05 DIAGNOSIS — J449 Chronic obstructive pulmonary disease, unspecified: Secondary | ICD-10-CM | POA: Diagnosis not present

## 2022-02-05 DIAGNOSIS — R6510 Systemic inflammatory response syndrome (SIRS) of non-infectious origin without acute organ dysfunction: Secondary | ICD-10-CM | POA: Insufficient documentation

## 2022-02-05 DIAGNOSIS — R079 Chest pain, unspecified: Secondary | ICD-10-CM

## 2022-02-05 DIAGNOSIS — R0602 Shortness of breath: Secondary | ICD-10-CM | POA: Diagnosis present

## 2022-02-05 DIAGNOSIS — E221 Hyperprolactinemia: Secondary | ICD-10-CM | POA: Insufficient documentation

## 2022-02-05 LAB — CBC WITH DIFFERENTIAL/PLATELET
Abs Immature Granulocytes: 0.1 10*3/uL — ABNORMAL HIGH (ref 0.00–0.07)
Basophils Absolute: 0 10*3/uL (ref 0.0–0.1)
Basophils Relative: 0 %
Eosinophils Absolute: 0.1 10*3/uL (ref 0.0–0.5)
Eosinophils Relative: 1 %
HCT: 41.2 % (ref 39.0–52.0)
Hemoglobin: 13.4 g/dL (ref 13.0–17.0)
Immature Granulocytes: 1 %
Lymphocytes Relative: 7 %
Lymphs Abs: 0.8 10*3/uL (ref 0.7–4.0)
MCH: 31.3 pg (ref 26.0–34.0)
MCHC: 32.5 g/dL (ref 30.0–36.0)
MCV: 96.3 fL (ref 80.0–100.0)
Monocytes Absolute: 0.9 10*3/uL (ref 0.1–1.0)
Monocytes Relative: 7 %
Neutro Abs: 9.9 10*3/uL — ABNORMAL HIGH (ref 1.7–7.7)
Neutrophils Relative %: 84 %
Platelets: 207 10*3/uL (ref 150–400)
RBC: 4.28 MIL/uL (ref 4.22–5.81)
RDW: 14.1 % (ref 11.5–15.5)
WBC: 11.8 10*3/uL — ABNORMAL HIGH (ref 4.0–10.5)
nRBC: 0 % (ref 0.0–0.2)

## 2022-02-05 LAB — D-DIMER, QUANTITATIVE: D-Dimer, Quant: 0.52 ug/mL-FEU — ABNORMAL HIGH (ref 0.00–0.50)

## 2022-02-05 LAB — BASIC METABOLIC PANEL
Anion gap: 10 (ref 5–15)
BUN: 15 mg/dL (ref 6–20)
CO2: 30 mmol/L (ref 22–32)
Calcium: 8.4 mg/dL — ABNORMAL LOW (ref 8.9–10.3)
Chloride: 93 mmol/L — ABNORMAL LOW (ref 98–111)
Creatinine, Ser: 1.22 mg/dL (ref 0.61–1.24)
GFR, Estimated: >60 mL/min (ref >60)
Glucose, Bld: 160 mg/dL — ABNORMAL HIGH (ref 70–99)
Potassium: 4.3 mmol/L (ref 3.5–5.1)
Sodium: 133 mmol/L — ABNORMAL LOW (ref 135–145)

## 2022-02-05 LAB — RESPIRATORY PANEL BY PCR

## 2022-02-05 LAB — HEPATIC FUNCTION PANEL
ALT: 21 U/L (ref 0–44)
AST: 40 U/L (ref 15–41)
Albumin: 3.7 g/dL (ref 3.5–5.0)
Alkaline Phosphatase: 46 U/L (ref 38–126)
Bilirubin, Direct: <0.1 mg/dL (ref 0.0–0.2)
Total Bilirubin: 0.3 mg/dL (ref 0.3–1.2)
Total Protein: 6.7 g/dL (ref 6.5–8.1)

## 2022-02-05 LAB — BRAIN NATRIURETIC PEPTIDE: B Natriuretic Peptide: 83.3 pg/mL (ref 0.0–100.0)

## 2022-02-05 LAB — TROPONIN I (HIGH SENSITIVITY)
Troponin I (High Sensitivity): 21 ng/L — ABNORMAL HIGH (ref <18)
Troponin I (High Sensitivity): 18 ng/L — ABNORMAL HIGH (ref <18)

## 2022-02-05 LAB — MAGNESIUM: Magnesium: 2.9 mg/dL — ABNORMAL HIGH (ref 1.7–2.4)

## 2022-02-05 LAB — PROCALCITONIN: Procalcitonin: 1.17 ng/mL

## 2022-02-05 MED ORDER — PREDNISONE 10 MG PO TABS: 10.0000 mg | ORAL_TABLET | Freq: Every day | ORAL | 0 refills | Status: DC

## 2022-02-05 MED ORDER — LACTATED RINGERS IV BOLUS: 1000.0000 mL | Freq: Once | INTRAVENOUS | Status: DC

## 2022-02-05 NOTE — ED Provider Notes (Signed)
[facility] Provider Note    Event Date/Time   First MD Initiated Contact with Patient 02/05/22 [phone]     (approximate)   History   Shortness of Breath   HPI  Eugene Blackburn is a 51 y.o. male with past medical history of asthma, COPD, HDL, remote tobacco abuse, and COVID about a month ago who presents for assessment of ongoing shortness of breath that he feels is not improved.  His PCP has tried several weeks of steroids as well as a course of doxycycline, Levaquin and most recently Bactrim which patient has taken 2 days of and he feels that none of these medications have helped.  He is on albuterol and Spiriva at home but does not feel these are helping much either.  He still coughing up some phlegm but no blood.  He has not had any new fevers, chest pain, Donnell pain, headache, earache, sore throat or urinary symptoms but does state he has some intermittent diarrhea over the last month since his diagnosis of COVID.  No rashes or extremity pain.  Denies any illicit drug use or EtOH use.  No other acute concerns at this time.      Physical Exam  Triage Vital Signs: ED Triage Vitals  Enc Vitals Group     BP      Pulse      Resp      Temp      Temp src      SpO2      Weight      Height      Head Circumference      Peak Flow      Pain Score      Pain Loc      Pain Edu?      Excl. in GC?     Most recent vital signs: Vitals:   02/05/22 0842 02/05/22 0931  BP: 91/62   Pulse: (!) 102 96  Resp: (!) 22   Temp: 97.6 F (36.4 C)   SpO2: 93% 90%    General: Awake, no distress.  CV:  Good peripheral perfusion.  2+ radial pulses. Resp:  Normal effort.  Clear bilaterally. Abd:  No distention.  Soft throughout Other:  Mild bilateral lower extremity edema.   ED Results / Procedures / Treatments  Labs (all labs ordered are listed, but only abnormal results are displayed) Labs Reviewed  BASIC METABOLIC PANEL - Abnormal; Notable for the following  components:      Result Value   Sodium 133 (*)    Chloride 93 (*)    Glucose, Bld 160 (*)    Calcium 8.4 (*)    All other components within normal limits  CBC WITH DIFFERENTIAL/PLATELET - Abnormal; Notable for the following components:   WBC 11.8 (*)    Neutro Abs 9.9 (*)    Abs Immature Granulocytes 0.10 (*)    All other components within normal limits  MAGNESIUM - Abnormal; Notable for the following components:   Magnesium 2.9 (*)    All other components within normal limits  D-DIMER, QUANTITATIVE - Abnormal; Notable for the following components:   D-Dimer, Quant 0.52 (*)    All other components within normal limits  TROPONIN I (HIGH SENSITIVITY) - Abnormal; Notable for the following components:   Troponin I (High Sensitivity) 21 (*)    All other components within normal limits  TROPONIN I (HIGH SENSITIVITY) - Abnormal; Notable for the following components:   Troponin I (High  Sensitivity) 18 (*)    All other components within normal limits  RESPIRATORY PANEL BY PCR  BRAIN NATRIURETIC PEPTIDE  PROCALCITONIN  HEPATIC FUNCTION PANEL     EKG  Patient is remarkable sinus tachycardia with ventricular rate of 125, incomplete right bundle branch block and nonspecific ST changes V2 without other clear evidence of acute ischemia significant erythema.   RADIOLOGY Chest reviewed by myself shows no focal consoidation, effusion, edema, pneumothorax or other clear acute thoracic process. I also reviewed radiology interpretation and agree with findings described.    PROCEDURES:  Critical Care performed: No  .1-3 Lead EKG Interpretation Performed by: Gilles Chiquito, MD Authorized by: Gilles Chiquito, MD     Interpretation: non-specific     ECG rate assessment: tachycardic     Rhythm: sinus rhythm     Ectopy: none     Conduction: normal    The patient is on the cardiac monitor to evaluate for evidence of arrhythmia and/or significant heart rate changes.   MEDICATIONS  ORDERED IN ED: Medications - No data to display   IMPRESSION / MDM / ASSESSMENT AND PLAN / ED COURSE  I reviewed the triage vital signs and the nursing notes.                              Differential diagnosis includes, but is not limited to prolonged symptoms related to COVID infection last month, COPD exacerbation, bacterial pneumonia, CHF, PE, arrhythmia, anemia and metabolic arrangements.  Patient denies any pain however lower suspicion for ACS or dissection.   ECG is remarkable sinus tachycardia with ventricular rate of 125, incomplete right bundle branch block and nonspecific ST changes V2 without other clear evidence of acute ischemia significant erythema.  Chest reviewed by myself shows no focal consoidation, effusion, edema, pneumothorax or other clear acute thoracic process. I also reviewed radiology interpretation and agree with findings described.  BMP shows slight hyperglycemia with glucose of 160 without any other significant right-sided metabolic derangements.  CBC shows WBC count of 11.8 without recent to compare to without evidence of anemia and normal platelets.  D-dimer slightly elevated 0.52.  Magnesium 2.9.  Troponin stable and downtrending from 21 to 18 and overall less concerning for closure I suspect some mild demand ischemia in the setting of acute infectious process.  Hepatic function panel unremarkable.  BNP double digits in outpatient has lower extremity edema no findings on chest x-ray or history of CHF to suggest acute CHF exacerbation.  Procalcitonin is elevated 1.17.  Given some hypoxia on arrival and borderline low blood pressure with tachypnea and tachycardia and concern for sepsis.  Discussed this at length with the patient and recommendation for broad-spectrum antibiotics IV fluids and a CTA to rule out a PE possibly further characterize lung infection.  In addition we will need some respiratory support for his hypoxia.  He states he understands all this but  must leave to take care of his dogs.  States he could have an underlying life-threatening infection or blood clot that could kill him if he leaves but he states he still has to leave.  I think he had capacity to refuse my recommendation for additional diagnostic studies IV fluids antibiotics oxygen and admission.  I discussed patient initial presentation and work-up with on-call pulmonologist Dr. Richardson Dopp who recommended close outpatient follow-up and that he happy to see patient in clinic in a few days.  I communicated this to patient  give patient pulmonology clinic follow-up information.  We also prescribed a short course of steroids which pulmonary recommends.  Unfortunately recommend azithromycin when she is allergic to and he is already been on doxycycline and levofloxacin.  Will defer additional antibiotics outpatient at this time I advised him he can continue his Bactrim.  We are able to obtain an extended respiratory virus panel prior to patient leaving which she can follow-up outpatient.  Discussed returning immediately if he changes his mind or going to any other emergency room.  Discharged AMA     FINAL CLINICAL IMPRESSION(S) / ED DIAGNOSES   Final diagnoses:  SOB (shortness of breath)  Acute respiratory failure with hypoxia (HCC)  SIRS (systemic inflammatory response syndrome) (HCC)  Elevated procalcitonin  Positive D dimer     Rx / DC Orders   ED Discharge Orders          Ordered    predniSONE (DELTASONE) 10 MG tablet  Daily        02/05/22 0957             Note:  This document was prepared using Dragon voice recognition software and may include unintentional dictation errors.   Gilles Chiquito, MD 02/05/22 1021

## 2022-02-05 NOTE — ED Notes (Signed)
Dr. Dajai Wahlert at bedside.

## 2022-02-05 NOTE — ED Triage Notes (Signed)
See paper chart for downtime 

## 2022-02-08 ENCOUNTER — Emergency Department: Payer: Medicare Other

## 2022-02-08 ENCOUNTER — Other Ambulatory Visit: Payer: Self-pay

## 2022-02-08 ENCOUNTER — Encounter: Payer: Self-pay | Admitting: Emergency Medicine

## 2022-02-08 ENCOUNTER — Emergency Department
Admission: EM | Admit: 2022-02-08 | Discharge: 2022-02-08 | Disposition: A | Discharge status: Home / Self Care | Payer: Medicare Other | Attending: Emergency Medicine | Admitting: Emergency Medicine

## 2022-02-08 DIAGNOSIS — J449 Chronic obstructive pulmonary disease, unspecified: Secondary | ICD-10-CM | POA: Insufficient documentation

## 2022-02-08 DIAGNOSIS — J441 Chronic obstructive pulmonary disease with (acute) exacerbation: Secondary | ICD-10-CM

## 2022-02-08 DIAGNOSIS — R0600 Dyspnea, unspecified: Secondary | ICD-10-CM | POA: Insufficient documentation

## 2022-02-08 DIAGNOSIS — Z8616 Personal history of COVID-19: Secondary | ICD-10-CM | POA: Diagnosis not present

## 2022-02-08 DIAGNOSIS — R0602 Shortness of breath: Secondary | ICD-10-CM | POA: Diagnosis present

## 2022-02-08 LAB — COMPREHENSIVE METABOLIC PANEL
ALT: 19 U/L (ref 0–44)
AST: 20 U/L (ref 15–41)
Albumin: 3.7 g/dL (ref 3.5–5.0)
Alkaline Phosphatase: 59 U/L (ref 38–126)
Anion gap: 8 (ref 5–15)
BUN: 8 mg/dL (ref 6–20)
CO2: 29 mmol/L (ref 22–32)
Calcium: 9 mg/dL (ref 8.9–10.3)
Chloride: 98 mmol/L (ref 98–111)
Creatinine, Ser: 0.98 mg/dL (ref 0.61–1.24)
GFR, Estimated: >60 mL/min (ref >60)
Glucose, Bld: 120 mg/dL — ABNORMAL HIGH (ref 70–99)
Potassium: 4.5 mmol/L (ref 3.5–5.1)
Sodium: 135 mmol/L (ref 135–145)
Total Bilirubin: 0.5 mg/dL (ref 0.3–1.2)
Total Protein: 6.9 g/dL (ref 6.5–8.1)

## 2022-02-08 LAB — CBC
HCT: 43.5 % (ref 39.0–52.0)
Hemoglobin: 13.8 g/dL (ref 13.0–17.0)
MCH: 31.9 pg (ref 26.0–34.0)
MCHC: 31.7 g/dL (ref 30.0–36.0)
MCV: 100.5 fL — ABNORMAL HIGH (ref 80.0–100.0)
Platelets: 283 10*3/uL (ref 150–400)
RBC: 4.33 MIL/uL (ref 4.22–5.81)
RDW: 14.1 % (ref 11.5–15.5)
WBC: 10.2 10*3/uL (ref 4.0–10.5)
nRBC: 0 % (ref 0.0–0.2)

## 2022-02-08 LAB — BRAIN NATRIURETIC PEPTIDE: B Natriuretic Peptide: 35.5 pg/mL (ref 0.0–100.0)

## 2022-02-08 LAB — TROPONIN I (HIGH SENSITIVITY): Troponin I (High Sensitivity): 9 ng/L (ref <18)

## 2022-02-08 MED ORDER — IOHEXOL 350 MG/ML SOLN
75.0000 mL | Freq: Once | INTRAVENOUS | Status: AC | PRN
  Administered 2022-02-08: 75 mL via INTRAVENOUS

## 2022-02-08 NOTE — ED Provider Notes (Signed)
[facility] Provider Note    Event Date/Time   First MD Initiated Contact with Patient 02/08/22 1545     (approximate)  History   Chief Complaint: Shortness of Breath  HPI  Eugene Blackburn is a 51 y.o. male with a past medical history of COPD, COVID 6 weeks ago who presents to the emergency department for worsening shortness of breath.  According to the patient for the past 6 weeks he has been experiencing significant shortness of breath.  Patient states he has been on multiple rounds of prednisone.  Patient has a pulmonology appointment for this coming Wednesday.  Patient intermittently will become short of breath and then he states his symptoms seem to improve.  Patient is noted to be quite tachycardic around 120 bpm.  Patient states this is baseline for him states every time he comes to the hospital they always work him up for high heart rate but this is normal for him per patient.  Patient denies any chest pain.  States his shortness of breath was worse earlier today so he came to the hospital for evaluation but states it is largely resolved and is back to his baseline level of shortness of breath.  Physical Exam   Triage Vital Signs: ED Triage Vitals  Enc Vitals Group     BP 02/08/22 1542 (!) 155/77     Pulse Rate 02/08/22 1542 (!) 125     Resp 02/08/22 1542 (!) 28     Temp 02/08/22 1542 98.6 F (37 C)     Temp Source 02/08/22 1542 Oral     SpO2 02/08/22 1542 98 %     Weight 02/08/22 1543 238 lb (108 kg)     Height 02/08/22 1543 6' (1.829 m)     Head Circumference --      Peak Flow --      Pain Score 02/08/22 1543 0     Pain Loc --      Pain Edu? --      Excl. in GC? --     Most recent vital signs: Vitals:   02/08/22 1542  BP: (!) 155/77  Pulse: (!) 125  Resp: (!) 28  Temp: 98.6 F (37 C)  SpO2: 98%    General: Awake, no distress.  CV:  Good peripheral perfusion.  Regular rhythm rate around 120 bpm. Resp:  Normal effort.  Equal breath  sounds bilaterally.  No significant wheeze rales or rhonchi. Abd:  No distention.  Soft, nontender.  No rebound or guarding. Other:  2+ pitting edema to bilateral lower extremities.  Patient states this is new x2-3 weeks.   ED Results / Procedures / Treatments   EKG  EKG viewed and interpreted by myself shows sinus tachycardia 122 bpm with a narrow QRS, normal axis, normal intervals, no concerning ST changes.  RADIOLOGY  I have reviewed the CT imaging, patient does appear to have a high riding left diaphragm otherwise no acute abnormality seen on my evaluation. Radiology is read the CT is negative for acute abnormality.   MEDICATIONS ORDERED IN ED: Medications - No data to display   IMPRESSION / MDM / ASSESSMENT AND PLAN / ED COURSE  I reviewed the triage vital signs and the nursing notes.  Patient presents to the emergency department for shortness of breath.  Patient states he has been experiencing shortness of breath over the past 6 weeks since being diagnosed with COVID.  Patient states a history of COPD at baseline.  States shortness of breath is worse earlier today so he came to the emergency department.  States his shortness of breath has improved now.  Given the patient's history of recent COVID infection and now shortness of breath we will obtain a CTA of the chest to rule out pulmonary embolism.  We will check labs including cardiac enzymes, BNP.  We will continue to closely monitor while awaiting results.  Patient agreeable to plan of care.  Per record review patient was seen in the emergency department 02/05/2022 for similar findings, patient was offered admission at that time but ultimately decided to go home AGAINST MEDICAL ADVICE.  Patient does have follow-up scheduled for Wednesday with Dr. Karna Christmas of pulmonology.  Patient's lab far quite reassuring.  Chemistry is normal including normal renal function.  Troponin is negative at 9.  CBC is normal.  CTA is pending, BNP is  pending.  I did review some of the patient's records including a 01/26/2019 neurology consultation patient was tachycardic around 110 bpm at that visit.  Recent ER visits patient had a pulse rate of 123, 125, 101.  Patient's work-up in the emergency department is overall reassuring.  No signs of pulmonary embolism on CTA.  I do believe the patient needs to follow-up with pulmonology on Wednesday as scheduled I also believe the patient would benefit from cardiology follow-up given his lower extremity edema right echocardiogram.  Patient agreeable to plan.  Patient is satting 96 to 98% on room air.  Vital signs otherwise are reassuring besides his pulse rate which remains elevated from 120 bpm.  Patient again reassures me that this is normal for him.  FINAL CLINICAL IMPRESSION(S) / ED DIAGNOSES   Dyspnea  Rx / DC Orders   Pulmonology follow-up Cardiology follow-up  Note:  This document was prepared using Dragon voice recognition software and may include unintentional dictation errors.   Minna Antis, MD 02/08/22 [phone]

## 2022-02-08 NOTE — ED Notes (Signed)
Pt presents to ED with c/o of SOB. Pt states he has ben recently treated with multiple ABX for unknown source of infection and states "I don't  feel a lot better".   Pt states he had one episode of SOB this morning which scared him and warranted him to come in. Pt presents tachycardiac and reports "that is my normal". Pt denies chest pain at this time. Pt is speaking in full sentences with no issue. Pt states recent COVID DX about 2-3 weeks ago. Pt states HX of COPD and states he has inhalers he uses at home. Pt is A&Ox4 at this time.

## 2022-02-08 NOTE — Discharge Instructions (Signed)
Please follow-up with your pulmonologist tomorrow as scheduled.  Please call the number provided to arrange a follow-up appointment with cardiology for further evaluation and consideration of an echocardiogram.  Return to the emergency department for any worsening shortness of breath, chest pain or any other symptom personally concerning to yourself.

## 2022-02-08 NOTE — ED Notes (Signed)
D/C and reasons to return to ED discussed with pt, pt verbalized understanding. NAD noted. Pt ambulatory with steady gait on D/C. 

## 2022-02-08 NOTE — ED Triage Notes (Signed)
Pt via POV from home. Pt c/o SOB and cough, pt was seen and d/c on Thursday morning. Pt is A&OX4, pt is tachycardiac and tachypneic on arrival. Pt states he is SOB more on exertion. Pt is A&OX4 and NAD

## 2022-02-09 ENCOUNTER — Institutional Professional Consult (permissible substitution): Payer: Medicare Other | Admitting: Pulmonary Disease

## 2022-02-09 ENCOUNTER — Ambulatory Visit (INDEPENDENT_AMBULATORY_CARE_PROVIDER_SITE_OTHER): Payer: Medicare Other | Admitting: Pulmonary Disease

## 2022-02-09 ENCOUNTER — Encounter: Payer: Self-pay | Admitting: Pulmonary Disease

## 2022-02-09 VITALS — BP 130/72 | HR 125 | Temp 98.5°F | Ht 72.0 in | Wt 233.0 lb

## 2022-02-09 DIAGNOSIS — R0609 Other forms of dyspnea: Secondary | ICD-10-CM

## 2022-02-09 MED ORDER — PREDNISONE 10 MG PO TABS: ORAL_TABLET | ORAL | 0 refills | Status: DC

## 2022-02-09 MED ORDER — ADVAIR HFA 230-21 MCG/ACT IN AERO: 2.0000 | INHALATION_SPRAY | Freq: Two times a day (BID) | RESPIRATORY_TRACT | 12 refills | Status: DC

## 2022-02-09 NOTE — Patient Instructions (Addendum)
Nice to meet you  Continue prednisone 20 mg through the end of February.  Start prednisone 15 mg March 1st for 2 weeks. Then 10 mg for 2 weeks starting March 15th.  Stop the diskus, start advair HFA 2 puffs twice a day - rinse mouth after every use  Continue spiriva  PFT at next visit  Return to clinic in 4 weeks or sooner as needed

## 2022-02-18 NOTE — Progress Notes (Signed)
 @Patient  ID: Eugene Blackburn, male    DOB: October 30, 1971, 51 y.o.   MRN: [id number]  Chief Complaint  Patient presents with   Consult    Consult for SOB. Pt states he had covid about 2 months ago when all the issues started. Pt states it gets worse when he gets up and does anything active.    Referring provider: Armando GangLindley, Cheryl P, FNP  HPI:   51 y.o. man whom we are seeing in consultation for evaluation of dyspnea on exertion.  Recent ED note x2 reviewed.  Patient diagnosed with COVID several weeks ago.  Late December 2022.  Since then had worsening cough, shortness of breath.  Some symptoms of improved but dyspnea lingers.  Presented to ED multiple times.  Has been on prednisone essentially continuously over the last several weeks.  Most doses above 20 mg.  Thinks it helped some but as he decreases symptoms seem to get worse.  Currently using 20 mg daily.  Inhaler can also be working as well.  He does like the Spiriva thinks it helps more than the Advair discus.  Seems bit worse with cough in the morning and at night.  He is stop smoking in the setting of all this.  Reviewed his cross-sectional imaging in the ED 02/08/2022 and on my review interpretation reveals scattered atelectasis, no clear infiltrate, no evidence of PE, mild emphysematous changes in the apices.  Most recent chest x-ray 02/05/2022 reviewed and interpreted as clear lungs.  PMH: Tobacco abuse in remission, recurrent bronchitis, idiopathic versus hereditary pancreatic insufficiency Surgical history: Pancreas surgery in the past Family history: Father with pancreatic disease, mother with lung cancer Social history: Former smoker[place]  Questionaires / Pulmonary Flowsheets:   ACT:  No flowsheet data found.  MMRC: No flowsheet data found.  Epworth:  No flowsheet data found.  Tests:   FENO:  No results found for: NITRICOXIDE  PFT: No flowsheet data found.  WALK:  No flowsheet data  found.  Imaging: Personally reviewed and as per EMR discussion this note DG Chest 2 View  Result Date: 02/05/2022 CLINICAL DATA:  Chest pain and shortness of breath. EXAM: CHEST - 2 VIEW COMPARISON:  09/28/2021 FINDINGS: The cardiomediastinal silhouette is unchanged with normal heart size. There is unchanged elevation of the left hemidiaphragm and mild blunting of the right costophrenic angle. No airspace consolidation, edema, sizable pleural effusion, or pneumothorax is identified. No acute osseous abnormality is seen. IMPRESSION: No active cardiopulmonary disease. Electronically Signed   By: Sebastian AcheAllen  Grady M.D.   On: 02/05/2022 07:35   CT Angio Chest PE W and/or Wo Contrast  Result Date: 02/08/2022 CLINICAL DATA:  Short of breath and cough. Tachycardia and tachypnea. EXAM: CT ANGIOGRAPHY CHEST WITH CONTRAST TECHNIQUE: Multidetector CT imaging of the chest was performed using the standard protocol during bolus administration of intravenous contrast. Multiplanar CT image reconstructions and MIPs were obtained to evaluate the vascular anatomy. RADIATION DOSE REDUCTION: This exam was performed according to the departmental dose-optimization program which includes automated exposure control, adjustment of the mA and/or kV according to patient size and/or use of iterative reconstruction technique. CONTRAST:  75mL OMNIPAQUE IOHEXOL 350 MG/ML SOLN COMPARISON:  Current and prior chest radiographs. FINDINGS: Cardiovascular: Pulmonary arteries are well opacified. There is no evidence of a pulmonary embolism. Heart is normal in size and configuration. Coronary arteries are unremarkable. No pericardial effusion. Great vessels are normal in caliber. No aortic dissection or atherosclerosis. Mediastinum/Nodes: No enlarged mediastinal, hilar, or axillary  lymph nodes. Thyroid gland, trachea, and esophagus demonstrate no significant findings. Lungs/Pleura: Opacity at the bases of the left upper lobe lingula and left lower  lobe, adjacent to an elevated left hemidiaphragm, are consistent with atelectasis. Minimal right upper and middle lobe and lower lobe subsegmental atelectasis. No lung consolidation to suggest pneumonia. No evidence of pulmonary edema. Minor paraseptal and centrilobular emphysema most evident at the right apex. No pleural effusion or pneumothorax. Upper Abdomen: No acute abnormality. Musculoskeletal: No chest wall abnormality. No acute or significant osseous findings. Review of the MIP images confirms the above findings. IMPRESSION: 1. No evidence of a pulmonary embolism. 2. No acute findings.  Mild basilar atelectasis mostly on the left. Electronically Signed   By: Amie Portland M.D.   On: 02/08/2022 18:02    Lab Results: Personally reviewed CBC    Component Value Date/Time   WBC 10.2 02/08/2022 1556   RBC 4.33 02/08/2022 1556   HGB 13.8 02/08/2022 1556   HGB 15.6 09/25/2013 1641   HCT 43.5 02/08/2022 1556   HCT 47.6 09/25/2013 1641   PLT 283 02/08/2022 1556   PLT 264 09/25/2013 1641   MCV 100.5 (H) 02/08/2022 1556   MCV 89 09/25/2013 1641   MCH 31.9 02/08/2022 1556   MCHC 31.7 02/08/2022 1556   RDW 14.1 02/08/2022 1556   RDW 13.9 09/25/2013 1641   LYMPHSABS 0.8 02/05/2022 0605   LYMPHSABS 2.6 09/25/2013 1641   MONOABS 0.9 02/05/2022 0605   MONOABS 0.6 09/25/2013 1641   EOSABS 0.1 02/05/2022 0605   EOSABS 0.2 09/25/2013 1641   BASOSABS 0.0 02/05/2022 0605   BASOSABS 0.1 09/25/2013 1641    BMET    Component Value Date/Time   NA 135 02/08/2022 1556   K 4.5 02/08/2022 1556   CL 98 02/08/2022 1556   CO2 29 02/08/2022 1556   GLUCOSE 120 (H) 02/08/2022 1556   BUN 8 02/08/2022 1556   CREATININE 0.98 02/08/2022 1556   CALCIUM 9.0 02/08/2022 1556   GFRNONAA >60 02/08/2022 1556   GFRAA >60 03/10/2020 1101    BNP    Component Value Date/Time   BNP 35.5 02/08/2022 1556    ProBNP No results found for: PROBNP  Specialty Problems   None   Allergies  Allergen Reactions    Gabapentin Shortness Of Breath   Zithromax [Azithromycin] Rash    Patient states azithromycin caused him to "bleed"   Levaquin [Levofloxacin]     Tendon problem   Penicillin G Hives   Penicillins Itching and Rash     There is no immunization history on file for this patient.  Past Medical History:  Diagnosis Date   Asthma    Chronic idiopathic constipation    Chronic obstruct airways disease (HCC)    Chronic pancreatitis (HCC)    Degenerative disorder of muscle    Hearing loss    Hemorrhoids    Hyperlipidemia    Insomnia    Male erectile dysfunction    Nausea    Pancreas (digestive gland) works poorly    Seasonal allergic rhinitis    Vitamin D deficiency     Tobacco History: Social History   Tobacco Use  Smoking Status Former   Types: Cigarettes  Smokeless Tobacco Never   Counseling given: Not Answered   Continue to not smoke  Outpatient Encounter Medications as of 02/09/2022  Medication Sig   Ascorbic Acid (VITAMIN C) 1000 MG tablet Take 1,000 mg by mouth daily.   clonazePAM (KLONOPIN) 0.5 MG tablet Take 0.5  mg by mouth 2 (two) times daily as needed.   ferrous sulfate 325 (65 FE) MG EC tablet Take 325 mg by mouth 3 (three) times daily with meals.   fluticasone (FLONASE) 50 MCG/ACT nasal spray Place 1 spray into both nostrils daily.   fluticasone-salmeterol (ADVAIR HFA) 230-21 MCG/ACT inhaler Inhale 2 puffs into the lungs 2 (two) times daily.   ibuprofen (ADVIL,MOTRIN) 600 MG tablet Take 1 tablet (600 mg total) by mouth every 6 (six) hours as needed.   ipratropium-albuterol (DUONEB) 0.5-2.5 (3) MG/3ML SOLN Take 3 mLs by nebulization.   linaclotide (LINZESS) 72 MCG capsule Take 72 mcg by mouth daily before breakfast.   magnesium 30 MG tablet Take 30 mg by mouth 2 (two) times daily.   oxyCODONE (OXYCONTIN) 20 mg 12 hr tablet Take 30 mg by mouth every 12 (twelve) hours.    PANCREAZE 20601 units CPEP TAKE ONE CAPSULE BY MOUTH 3 TIMES A DAY   pantoprazole (PROTONIX)  40 MG tablet Take 40 mg by mouth daily.   predniSONE (DELTASONE) 10 MG tablet Take 2 tablets (20 mg total) by mouth daily with breakfast for 15 days, THEN 1.5 tablets (15 mg total) daily with breakfast for 14 days, THEN 1 tablet (10 mg total) daily with breakfast for 14 days.   ranitidine (ZANTAC) 75 MG tablet Take 75 mg by mouth 2 (two) times daily.   tiotropium (SPIRIVA) 18 MCG inhalation capsule Place 18 mcg into inhaler and inhale daily.   VENTOLIN HFA 108 (90 Base) MCG/ACT inhaler 2 puffs every 4 (four) hours as needed. Last taken: 1015am. Today.   [DISCONTINUED] benzonatate (TESSALON) 100 MG capsule Take 1 capsule (100 mg total) by mouth every 8 (eight) hours.   [DISCONTINUED] Fluticasone-Salmeterol (ADVAIR) 250-50 MCG/DOSE AEPB Inhale 1 puff into the lungs 2 (two) times daily.   [DISCONTINUED] predniSONE (DELTASONE) 10 MG tablet Take 1 tablet (10 mg total) by mouth daily for 5 days.   [DISCONTINUED] promethazine-dextromethorphan (PROMETHAZINE-DM) 6.25-15 MG/5ML syrup Take 5 mLs by mouth 4 (four) times daily as needed.   No facility-administered encounter medications on file as of 02/09/2022.     Review of Systems  Review of Systems  No chest pain exertion.  No orthopnea or PND.  Comprehensive review of systems otherwise negative. Physical Exam  BP 130/72 (BP Location: Left Arm, Patient Position: Sitting, Cuff Size: Normal)    Pulse (!) 125    Temp 98.5 F (36.9 C) (Oral)    Ht 6' (1.829 m)    Wt 233 lb (105.7 kg)    SpO2 97%    BMI 31.60 kg/m   Wt Readings from Last 5 Encounters:  02/09/22 233 lb (105.7 kg)  02/08/22 238 lb (108 kg)  12/24/21 229 lb 15 oz (104.3 kg)  09/28/21 230 lb (104.3 kg)  08/09/21 227 lb (103 kg)    BMI Readings from Last 5 Encounters:  02/09/22 31.60 kg/m  02/08/22 32.28 kg/m  12/24/21 31.19 kg/m  09/28/21 31.19 kg/m  08/09/21 30.79 kg/m     Physical Exam General: Well-appearing, no acute distress Eyes: EOMI, no icterus Neck: Supple, no  JVP Pulmonary: Clear, no work of breathing Cardiovascular: Regular rhythm, murmur Abdomen: Syncopal sounds present MSK: No synovitis, joint effusion Neuro: Normal gait, no weakness Psych: Normal mood, full affect   Assessment & Plan:   Dyspnea on exertion: Likely in the setting of prolonged bronchitis due to COVID-pneumonia exacerbated by underlying emphysema and likely smoking-related lung disease.  Slowly improving.  Has been on prednisone  high-dose 40 mg or so for several weeks now.  Slowly will taper.  Continue prednisone 20 mg 2 days of the month.  Then decrease by 5 mg every 2 weeks.  Follow-up plan for when he is on 10 mg daily. Plan PFTs next visit.  Recent cross-sectional chest imaging reveals evidence of atelectasis without PE, no clear parenchyma reason for his worsening symptoms other than underlying mild emphysema.  Recurrent bronchitis/chronic bronchitis: Asthma versus smoking-related disease.  Stop Diskus Advair.  Start high-dose HFA 2 puffs twice daily.  Continue Spiriva.   Return in about 4 weeks (around 03/09/2022).   Karren Burly, MD 02/18/2022

## 2022-03-12 ENCOUNTER — Ambulatory Visit
Admission: EM | Admit: 2022-03-12 | Discharge: 2022-03-12 | Disposition: A | Discharge status: Home / Self Care | Payer: Medicare Other | Attending: Emergency Medicine | Admitting: Emergency Medicine

## 2022-03-12 ENCOUNTER — Other Ambulatory Visit: Payer: Self-pay

## 2022-03-12 DIAGNOSIS — J0100 Acute maxillary sinusitis, unspecified: Secondary | ICD-10-CM | POA: Diagnosis not present

## 2022-03-12 MED ORDER — PROMETHAZINE-DM 6.25-15 MG/5ML PO SYRP: 5.0000 mL | ORAL_SOLUTION | Freq: Four times a day (QID) | ORAL | 0 refills | Status: DC | PRN

## 2022-03-12 MED ORDER — BENZONATATE 100 MG PO CAPS: 200.0000 mg | ORAL_CAPSULE | Freq: Three times a day (TID) | ORAL | 0 refills | Status: DC

## 2022-03-12 MED ORDER — PREDNISONE 10 MG (21) PO TBPK: ORAL_TABLET | ORAL | 0 refills | Status: DC

## 2022-03-12 MED ORDER — IPRATROPIUM BROMIDE 0.06 % NA SOLN: 2.0000 | Freq: Four times a day (QID) | NASAL | 12 refills | Status: DC

## 2022-03-12 MED ORDER — DOXYCYCLINE HYCLATE 100 MG PO CAPS: 100.0000 mg | ORAL_CAPSULE | Freq: Two times a day (BID) | ORAL | 0 refills | Status: DC

## 2022-03-12 NOTE — ED Provider Notes (Signed)
?[facility] ? ? ? ?CSN: [id number] ?Arrival date & time: 03/12/22  4709 ? ? ?  ? ?History   ?Chief Complaint ?Chief Complaint  ?Patient presents with  ? Nasal Congestion  ? ? ?HPI ?Eugene Blackburn is a 51 y.o. male.  ? ?HPI ? ?51 year old male here for evaluation of respiratory complaints. ? ?Patient reports that he has been experiencing nasal congestion and a cough for the past 3 weeks.  He indicates that for the first 2 weeks he had a fever with a Tmax of 102 but is not had fever in the last week.  He is now complaining of significant amount of sinus pressure, nasal discharge that is brown to green in color, and a nonproductive cough.  He states initially his cough was productive for brown sputum as well but now it has dried out.  He also has some mild shortness of breath and wheezing.  He denies any ear pain.  Patient was evaluated in the emergency department on 02/08/2022 for COPD exacerbation and had a follow-up with pulmonology.  He was switched from Advair Diskus to Advair HFA and reports that it is helping his symptoms.  He has been using his rescue inhalers at home but has not had to increase the use. ? ?Past Medical History:  ?Diagnosis Date  ? Asthma   ? Chronic idiopathic constipation   ? Chronic obstruct airways disease (HCC)   ? Chronic pancreatitis (HCC)   ? Degenerative disorder of muscle   ? Hearing loss   ? Hemorrhoids   ? Hyperlipidemia   ? Insomnia   ? Male erectile dysfunction   ? Nausea   ? Pancreas (digestive gland) works poorly   ? Seasonal allergic rhinitis   ? Vitamin D deficiency   ? ? ?There are no problems to display for this patient. ? ? ?Past Surgical History:  ?Procedure Laterality Date  ? PANCREAS SURGERY    ? ? ? ? ? ?Home Medications   ? ?Prior to Admission medications   ?Medication Sig Start Date End Date Taking? Authorizing Provider  ?benzonatate (TESSALON) 100 MG capsule Take 2 capsules (200 mg total) by mouth every 8 (eight) hours. 03/12/22  Yes Becky Augusta, NP   ?doxycycline (VIBRAMYCIN) 100 MG capsule Take 1 capsule (100 mg total) by mouth 2 (two) times daily. 03/12/22  Yes Becky Augusta, NP  ?ipratropium (ATROVENT) 0.06 % nasal spray Place 2 sprays into both nostrils 4 (four) times daily. 03/12/22  Yes Becky Augusta, NP  ?predniSONE (STERAPRED UNI-PAK 21 TAB) 10 MG (21) TBPK tablet Take 6 tablets on day 1, 5 tablets day 2, 4 tablets day 3, 3 tablets day 4, 2 tablets day 5, 1 tablet day 6 03/12/22  Yes Becky Augusta, NP  ?promethazine-dextromethorphan (PROMETHAZINE-DM) 6.25-15 MG/5ML syrup Take 5 mLs by mouth 4 (four) times daily as needed. 03/12/22  Yes Becky Augusta, NP  ?Ascorbic Acid (VITAMIN C) 1000 MG tablet Take 1,000 mg by mouth daily.    [provider]  ?clonazePAM (KLONOPIN) 0.5 MG tablet Take 0.5 mg by mouth 2 (two) times daily as needed. 01/14/22   [provider]  ?ferrous sulfate 325 (65 FE) MG EC tablet Take 325 mg by mouth 3 (three) times daily with meals.    [provider]  ?fluticasone (FLONASE) 50 MCG/ACT nasal spray Place 1 spray into both nostrils daily.    [provider]  ?fluticasone-salmeterol (ADVAIR HFA) 230-21 MCG/ACT inhaler Inhale 2 puffs into the lungs 2 (two) times  daily. 02/09/22   Hunsucker, Lesia Sago, MD  ?ibuprofen (ADVIL,MOTRIN) 600 MG tablet Take 1 tablet (600 mg total) by mouth every 6 (six) hours as needed. 04/17/18   Domenick Gong, MD  ?ipratropium-albuterol (DUONEB) 0.5-2.5 (3) MG/3ML SOLN Take 3 mLs by nebulization.    [provider]  ?linaclotide (LINZESS) 72 MCG capsule Take 72 mcg by mouth daily before breakfast.    [provider]  ?magnesium 30 MG tablet Take 30 mg by mouth 2 (two) times daily.    [provider]  ?oxyCODONE (OXYCONTIN) 20 mg 12 hr tablet Take 30 mg by mouth every 12 (twelve) hours.     [provider]  ?PANCREAZE 53664 units CPEP TAKE ONE CAPSULE BY MOUTH 3 TIMES A DAY 02/01/18   [provider]  ?pantoprazole (PROTONIX) 40 MG tablet  Take 40 mg by mouth daily.    [provider]  ?ranitidine (ZANTAC) 75 MG tablet Take 75 mg by mouth 2 (two) times daily.    [provider]  ?tiotropium (SPIRIVA) 18 MCG inhalation capsule Place 18 mcg into inhaler and inhale daily.    [provider]  ?VENTOLIN HFA 108 (90 Base) MCG/ACT inhaler 2 puffs every 4 (four) hours as needed. Last taken: 1015am. Today. 02/25/18   [provider]  ? ? ?Family History ?Family History  ?Problem Relation Age of Onset  ? Lung cancer Mother   ? Pancreatic disease Father   ? ? ?Social History ?Social History  ? ?Tobacco Use  ? Smoking status: Former  ?  Types: Cigarettes  ? Smokeless tobacco: Never  ?Vaping Use  ? Vaping Use: Former  ?Substance Use Topics  ? Alcohol use: No  ?  Alcohol/week: 0.0 standard drinks  ? Drug use: No  ? ? ? ?Allergies   ?Gabapentin, Zithromax [azithromycin], Levaquin [levofloxacin], Penicillin g, and Penicillins ? ? ?Review of Systems ?Review of Systems  ?Constitutional:  Positive for fever.  ?HENT:  Positive for congestion, rhinorrhea and sinus pressure. Negative for ear pain and sore throat.   ?Respiratory:  Positive for cough, shortness of breath and wheezing.   ?Gastrointestinal:  Negative for diarrhea, nausea and vomiting.  ?Skin:  Negative for rash.  ?Hematological: Negative.   ?Psychiatric/Behavioral: Negative.    ? ? ?Physical Exam ?Triage Vital Signs ?ED Triage Vitals  ?Enc Vitals Group  ?   BP 03/12/22 1005 123/90  ?   Pulse Rate 03/12/22 1005 (!) 114  ?   Resp 03/12/22 1005 20  ?   Temp 03/12/22 1005 98.4 ?F (36.9 ?C)  ?   Temp Source 03/12/22 1005 Oral  ?   SpO2 03/12/22 1005 98 %  ?   Weight --   ?   Height --   ?   Head Circumference --   ?   Peak Flow --   ?   Pain Score 03/12/22 1003 0  ?   Pain Loc --   ?   Pain Edu? --   ?   Excl. in GC? --   ? ?No data found. ? ?Updated Vital Signs ?BP 123/90 (BP Location: Right Arm)   Pulse (!) 114   Temp 98.4 ?F (36.9 ?C) (Oral)   Resp 20   SpO2 98%  ? ?Visual  Acuity ?Right Eye Distance:   ?Left Eye Distance:   ?Bilateral Distance:   ? ?Right Eye Near:   ?Left Eye Near:    ?Bilateral Near:    ? ?Physical Exam ?Vitals and nursing note  reviewed.  ?Constitutional:   ?   Appearance: Normal appearance. He is not ill-appearing.  ?HENT:  ?   Head: Normocephalic and atraumatic.  ?   Right Ear: Tympanic membrane, ear canal and external ear normal. There is no impacted cerumen.  ?   Left Ear: Tympanic membrane, ear canal and external ear normal. There is no impacted cerumen.  ?   Nose: Congestion and rhinorrhea present.  ?   Comments: Mucosa is erythematous and edematous with bloody discharge in the right nare.  Bilateral maxillary sinuses are tenderness to percussion. ?   Mouth/Throat:  ?   Mouth: Mucous membranes are moist.  ?   Pharynx: Oropharynx is clear. No posterior oropharyngeal erythema.  ?Cardiovascular:  ?   Rate and Rhythm: Normal rate and regular rhythm.  ?   Pulses: Normal pulses.  ?   Heart sounds: Normal heart sounds. No murmur heard. ?  No friction rub. No gallop.  ?Pulmonary:  ?   Effort: Pulmonary effort is normal.  ?   Breath sounds: Normal breath sounds. No wheezing, rhonchi or rales.  ?Musculoskeletal:  ?   Cervical back: Normal range of motion and neck supple.  ?Lymphadenopathy:  ?   Cervical: No cervical adenopathy.  ?Skin: ?   General: Skin is warm and dry.  ?   Capillary Refill: Capillary refill takes less than 2 seconds.  ?   Findings: No erythema or rash.  ?Neurological:  ?   General: No focal deficit present.  ?   Mental Status: He is alert and oriented to person, place, and time.  ?Psychiatric:     ?   Mood and Affect: Mood normal.     ?   Behavior: Behavior normal.     ?   Thought Content: Thought content normal.     ?   Judgment: Judgment normal.  ? ? ? ?UC Treatments / Results  ?Labs ?(all labs ordered are listed, but only abnormal results are displayed) ?Labs Reviewed - No data to display ? ?EKG ? ? ?Radiology ?No results  found. ? ?Procedures ?Procedures (including critical care time) ? ?Medications Ordered in UC ?Medications - No data to display ? ?Initial Impression / Assessment and Plan / UC Course  ?I have reviewed the triage vital signs and the nurs

## 2022-03-12 NOTE — Discharge Instructions (Signed)
The Augmentin twice daily with food for 10 days for treatment of your sinusitis.  Perform sinus irrigation 2-3 times a day with a NeilMed sinus rinse kit and distilled water.  Do not use tap water.  You can use plain over-the-counter Mucinex every 6 hours to break up the stickiness of the mucus so your body can clear it.  Increase your oral fluid intake to thin out your mucus so that is also able for your body to clear more easily.  Take an over-the-counter probiotic, such as Culturelle-align-activia, 1 hour after each dose of antibiotic to prevent diarrhea.  Use the Atrovent nasal spray, 2 squirts in each nostril every 6 hours, as needed for runny nose and postnasal drip.  Use the Tessalon Perles every 8 hours during the day.  Take them with a small sip of water.  They may give you some numbness to the base of your tongue or a metallic taste in your mouth, this is normal.  Use the Promethazine DM cough syrup at bedtime for cough and congestion.  It will make you drowsy so do not take it during the day.  If you develop any new or worsening symptoms return for reevaluation or see your primary care provider.  

## 2022-03-12 NOTE — ED Triage Notes (Signed)
Patient presents to Urgent Care with complaints of cough, nasal congestion x 3 weeks. He states he had the flu 3 weeks ago. Treating symptoms with sudafed and mucinex.  ? ?Denies fever.  ?

## 2022-03-19 ENCOUNTER — Telehealth: Payer: Self-pay | Admitting: Pulmonary Disease

## 2022-03-19 MED ORDER — PREDNISONE 10 MG PO TABS: 10.0000 mg | ORAL_TABLET | Freq: Every day | ORAL | 0 refills | Status: DC

## 2022-03-19 NOTE — Telephone Encounter (Signed)
Called and spoke with patient. He stated that he has been sick for the past few weeks after having the flu earlier this morning. He went back to the ED on 03/12/22 and was prescribed doxycycline and a prednisone taper. He is still on the doxycycline but is completely out of the prednisone. Denied any fevers or body aches.  ? ?Dr. Silas Flood wanted him to decrease his prednisone at his last OV in February. He was supposed to be on prednisone 10mg  once daily but he does not have enough to last until his appt in March.  ? ?He wanted to know if we could send in more prednisone for him.  ? ?Pharmacy is [facility] in [place].  ? ?Dr. Verlee Monte, can you please advise? Thanks!  ?

## 2022-03-19 NOTE — Telephone Encounter (Signed)
Called and spoke with patient. He is aware that [facility] has sent in the extra prednisone for him.  ? ?Nothing further needed at time of call.  ?

## 2022-03-27 ENCOUNTER — Encounter: Payer: Self-pay | Admitting: Pulmonary Disease

## 2022-03-27 ENCOUNTER — Ambulatory Visit (INDEPENDENT_AMBULATORY_CARE_PROVIDER_SITE_OTHER): Payer: Medicare Other | Admitting: Pulmonary Disease

## 2022-03-27 ENCOUNTER — Ambulatory Visit (INDEPENDENT_AMBULATORY_CARE_PROVIDER_SITE_OTHER): Payer: Medicare Other

## 2022-03-27 VITALS — BP 124/72 | HR 121 | Temp 98.6°F | Ht 72.0 in | Wt 233.0 lb

## 2022-03-27 DIAGNOSIS — R053 Chronic cough: Secondary | ICD-10-CM

## 2022-03-27 DIAGNOSIS — R0609 Other forms of dyspnea: Secondary | ICD-10-CM | POA: Diagnosis not present

## 2022-03-27 MED ORDER — PREDNISONE 20 MG PO TABS: ORAL_TABLET | ORAL | 0 refills | Status: DC

## 2022-03-27 NOTE — Patient Instructions (Addendum)
Sorry not feeling well ? ?I worry that bronchitis or recurrent asthma has returned made worse by flu recently ? ?Take prednisone 40 mg for 5 days then 20 mg for 5 days then stay on 10 mg thereafter.  I hope to see you back in the interim to make a plan with the steroids moving forward but for now we will Park the dose to 10 mg assuming you are feeling better in the coming days. ? ?We will get a chest x-ray to evaluate potential pneumonia, reason for your chest pain.  If there is suspicious findings on the chest x-ray I will call in an antibiotic. ? ?Try some over-the-counter cough suppressants like dextromethorphan, any product with DM and it will contain this.  Delsym is a brand name over-the-counter that you can get. ? ?If your cough is not improving on Monday, please call me and we will consider sending in stronger cough medicine. ? ?I think we need to consider stronger medications in the future but the breathing test will help determine this. ? ?Return to clinic in 4 weeks or sooner as needed with Dr. Judeth Horn ?

## 2022-03-27 NOTE — Progress Notes (Signed)
? ?@Patient  ID: Eugene Blackburn, male    DOB: September 10, 1971, 51 y.o.   MRN: [id number] ? ?Chief Complaint  ?Patient presents with  ? Follow-up  ?  Pt states he got the flu and then sinus infection and is now having a lot of breathing issues. Pt states he feels like he is gettting PNA   ? ? ?Referring provider: ?Armando GangLindley, Cheryl P, FNP ? ?HPI:  ? ?51 y.o. man whom we are seeing in follow up for evaluation of dyspnea on exertion.  Recent ED note  reviewed. ? ?Patient has been doing well since her last visit over the next couple weeks.  However he contracted flu early March 2023.  Felt pretty ill for about 10 days.  Then developed a lot of sinus pressure, headache, fluid sensation behind his eyeballs etc.  With urgent care 03/12/2022.  Was prescribed prednisone and Augmentin.  He states he is not taking doxycycline.  This greatly improved his headache, breathing was a bit better.  Then over the last 4 to 5 days has had worsening cough, dyspnea exertion.  Associated chest pain with the cough.  Raw throat with the cough.  Cough is dry, nonproductive.  PFTs are scheduled today for follow-up and evaluation of his dyspnea exertion.  However given he is feeling acutely ill, these were not performed and will be rescheduled. ? ?HPI at initial visit: ?Patient diagnosed with COVID several weeks ago.  Late December 2022.  Since then had worsening cough, shortness of breath.  Some symptoms of improved but dyspnea lingers.  Presented to ED multiple times.  Has been on prednisone essentially continuously over the last several weeks.  Most doses above 20 mg.  Thinks it helped some but as he decreases symptoms seem to get worse.  Currently using 20 mg daily.  Inhaler can also be working as well.  He does like the Spiriva thinks it helps more than the Advair discus.  Seems bit worse with cough in the morning and at night.  He is stop smoking in the setting of all this.  Reviewed his cross-sectional imaging in the ED 02/08/2022 and on my  review interpretation reveals scattered atelectasis, no clear infiltrate, no evidence of PE, mild emphysematous changes in the apices.  Most recent chest x-ray 02/05/2022 reviewed and interpreted as clear lungs. ? ?PMH: Tobacco abuse in remission, recurrent bronchitis, idiopathic versus hereditary pancreatic insufficiency ?Surgical history: Pancreas surgery in the past ?Family history: Father with pancreatic disease, mother with lung cancer ?Social history: Former smoker, lives in [place] ? ?Questionaires / Pulmonary Flowsheets:  ? ?ACT:  ?   ? View : No data to display.  ?  ?  ?  ? ? ?MMRC: ?   ? View : No data to display.  ?  ?  ?  ? ? ?Epworth:  ?   ? View : No data to display.  ?  ?  ?  ? ? ?Tests:  ? ?FENO:  ?No results found for: NITRICOXIDE ? ?PFT: ?   ? View : No data to display.  ?  ?  ?  ? ? ?WALK:  ?   ? View : No data to display.  ?  ?  ?  ? ? ?Imaging: ?Personally reviewed and as per EMR discussion this note ?No results found. ? ?Lab Results: ?Personally reviewed ?CBC ?   ?Component Value Date/Time  ? WBC 10.2 02/08/2022 1556  ? RBC 4.33 02/08/2022 1556  ? HGB 13.8 02/08/2022 1556  ?  HGB 15.6 09/25/2013 1641  ? HCT 43.5 02/08/2022 1556  ? HCT 47.6 09/25/2013 1641  ? PLT 283 02/08/2022 1556  ? PLT 264 09/25/2013 1641  ? MCV 100.5 (H) 02/08/2022 1556  ? MCV 89 09/25/2013 1641  ? MCH 31.9 02/08/2022 1556  ? MCHC 31.7 02/08/2022 1556  ? RDW 14.1 02/08/2022 1556  ? RDW 13.9 09/25/2013 1641  ? LYMPHSABS 0.8 02/05/2022 0605  ? LYMPHSABS 2.6 09/25/2013 1641  ? MONOABS 0.9 02/05/2022 0605  ? MONOABS 0.6 09/25/2013 1641  ? EOSABS 0.1 02/05/2022 0605  ? EOSABS 0.2 09/25/2013 1641  ? BASOSABS 0.0 02/05/2022 3474  ? BASOSABS 0.1 09/25/2013 1641  ? ? ?BMET ?   ?Component Value Date/Time  ? NA 135 02/08/2022 1556  ? K 4.5 02/08/2022 1556  ? CL 98 02/08/2022 1556  ? CO2 29 02/08/2022 1556  ? GLUCOSE 120 (H) 02/08/2022 1556  ? BUN 8 02/08/2022 1556  ? CREATININE 0.98 02/08/2022 1556  ? CALCIUM 9.0 02/08/2022 1556  ?  GFRNONAA >60 02/08/2022 1556  ? GFRAA >60 03/10/2020 1101  ? ? ?BNP ?   ?Component Value Date/Time  ? BNP 35.5 02/08/2022 1556  ? ? ?ProBNP ?No results found for: PROBNP ? ?Specialty Problems   ?None ? ? ?Allergies  ?Allergen Reactions  ? Gabapentin Shortness Of Breath  ? Zithromax [Azithromycin] Rash  ?  Patient states azithromycin caused him to "bleed"  ? Levaquin [Levofloxacin]   ?  Tendon problem  ? Penicillin G Hives  ? Penicillins Itching and Rash  ? ? ? ?There is no immunization history on file for this patient. ? ?Past Medical History:  ?Diagnosis Date  ? Asthma   ? Chronic idiopathic constipation   ? Chronic obstruct airways disease (HCC)   ? Chronic pancreatitis (HCC)   ? Degenerative disorder of muscle   ? Hearing loss   ? Hemorrhoids   ? Hyperlipidemia   ? Insomnia   ? Male erectile dysfunction   ? Nausea   ? Pancreas (digestive gland) works poorly   ? Seasonal allergic rhinitis   ? Vitamin D deficiency   ? ? ?Tobacco History: ?Social History  ? ?Tobacco Use  ?Smoking Status Former  ? Types: Cigarettes  ?Smokeless Tobacco Never  ? ?Counseling given: Not Answered ? ? ?Continue to not smoke ? ?Outpatient Encounter Medications as of 03/27/2022  ?Medication Sig  ? Ascorbic Acid (VITAMIN C) 1000 MG tablet Take 1,000 mg by mouth daily.  ? clonazePAM (KLONOPIN) 0.5 MG tablet Take 0.5 mg by mouth 2 (two) times daily as needed.  ? ferrous sulfate 325 (65 FE) MG EC tablet Take 325 mg by mouth 3 (three) times daily with meals.  ? fluticasone (FLONASE) 50 MCG/ACT nasal spray Place 1 spray into both nostrils daily.  ? fluticasone-salmeterol (ADVAIR HFA) 230-21 MCG/ACT inhaler Inhale 2 puffs into the lungs 2 (two) times daily.  ? ibuprofen (ADVIL,MOTRIN) 600 MG tablet Take 1 tablet (600 mg total) by mouth every 6 (six) hours as needed.  ? ipratropium (ATROVENT) 0.06 % nasal spray Place 2 sprays into both nostrils 4 (four) times daily.  ? ipratropium-albuterol (DUONEB) 0.5-2.5 (3) MG/3ML SOLN Take 3 mLs by nebulization.   ? linaclotide (LINZESS) 72 MCG capsule Take 72 mcg by mouth daily before breakfast.  ? magnesium 30 MG tablet Take 30 mg by mouth 2 (two) times daily.  ? oxyCODONE (OXYCONTIN) 20 mg 12 hr tablet Take 30 mg by mouth every 12 (twelve) hours.   ? PANCREAZE  16800 units CPEP TAKE ONE CAPSULE BY MOUTH 3 TIMES A DAY  ? pantoprazole (PROTONIX) 40 MG tablet Take 40 mg by mouth daily.  ? predniSONE (DELTASONE) 20 MG tablet Take 2 tablets (40 mg total) by mouth daily with breakfast for 5 days, THEN 1 tablet (20 mg total) daily with breakfast for 5 days, THEN 0.5 tablets (10 mg total) daily with breakfast.  ? ranitidine (ZANTAC) 75 MG tablet Take 75 mg by mouth 2 (two) times daily.  ? tiotropium (SPIRIVA) 18 MCG inhalation capsule Place 18 mcg into inhaler and inhale daily.  ? VENTOLIN HFA 108 (90 Base) MCG/ACT inhaler 2 puffs every 4 (four) hours as needed. Last taken: 1015am. Today.  ? [DISCONTINUED] benzonatate (TESSALON) 100 MG capsule Take 2 capsules (200 mg total) by mouth every 8 (eight) hours.  ? [DISCONTINUED] doxycycline (VIBRAMYCIN) 100 MG capsule Take 1 capsule (100 mg total) by mouth 2 (two) times daily.  ? [DISCONTINUED] predniSONE (DELTASONE) 10 MG tablet Take 1 tablet (10 mg total) by mouth daily with breakfast for 14 days.  ? [DISCONTINUED] promethazine-dextromethorphan (PROMETHAZINE-DM) 6.25-15 MG/5ML syrup Take 5 mLs by mouth 4 (four) times daily as needed.  ? ?No facility-administered encounter medications on file as of 03/27/2022.  ? ? ? ?Review of Systems ? ?Review of Systems  ?N/a ?Physical Exam ? ?BP 124/72 (BP Location: Left Arm, Patient Position: Sitting, Cuff Size: Normal)   Pulse (!) 121   Temp 98.6 ?F (37 ?C) (Oral)   Ht 6' (1.829 m)   Wt 233 lb (105.7 kg)   SpO2 95%   BMI 31.60 kg/m?  ? ?Wt Readings from Last 5 Encounters:  ?03/27/22 233 lb (105.7 kg)  ?02/09/22 233 lb (105.7 kg)  ?02/08/22 238 lb (108 kg)  ?12/24/21 229 lb 15 oz (104.3 kg)  ?09/28/21 230 lb (104.3 kg)  ? ? ?BMI Readings from  Last 5 Encounters:  ?03/27/22 31.60 kg/m?  ?02/09/22 31.60 kg/m?  ?02/08/22 32.28 kg/m?  ?12/24/21 31.19 kg/m?  ?09/28/21 31.19 kg/m?  ? ? ? ?Physical Exam ?General: looks tired, no acute distress ?Eyes: EOMI, no ic

## 2022-03-30 ENCOUNTER — Telehealth: Payer: Self-pay | Admitting: Pulmonary Disease

## 2022-03-30 MED ORDER — PROMETHAZINE-DM 6.25-15 MG/5ML PO SYRP: 5.0000 mL | ORAL_SOLUTION | Freq: Every evening | ORAL | 0 refills | Status: DC | PRN

## 2022-03-30 NOTE — Telephone Encounter (Signed)
I would continue the prednisone taper as prescribed, 40 mg for 5 days, then 20 mg for 5 days then back to 10 mg daily. Staying on high dose prednisone will cause irreversible side effects that I am trying to avoid. His chest xray is clear which is good. I think we may need stronger medicine such as an injectable or biologic drug to treat this. We need PFTs to help determine this. ? ?Is the cough any better?

## 2022-03-30 NOTE — Telephone Encounter (Signed)
Called in small supply of stronger cough syrup to be used at night. He can not drive or operate machinery when he takes this.

## 2022-03-30 NOTE — Telephone Encounter (Signed)
The cough is about the same but he was concerned that it was making him so short of breath. It is still dry. Please advise.  ?

## 2022-03-30 NOTE — Telephone Encounter (Signed)
He has been taking 2 prednisone on Friday, Saturday and 2 Sunday and he was doing a little better and he had 50 mg yesterday. He is having some shortness of breath and dry cough and he wants to know what you would recommend that such as increase the prednisone as he felt better on 50 mg? He feels worse on the 40 mg or less. Please advise.  ?

## 2022-03-30 NOTE — Telephone Encounter (Signed)
Called and spoke with patient. He verbalized understanding of recommendations. Nothing further needed at time of call.  

## 2022-04-11 ENCOUNTER — Ambulatory Visit
Admission: EM | Admit: 2022-04-11 | Discharge: 2022-04-11 | Disposition: A | Discharge status: Home / Self Care | Payer: Medicare Other | Attending: Physician Assistant | Admitting: Physician Assistant

## 2022-04-11 ENCOUNTER — Other Ambulatory Visit: Payer: Self-pay

## 2022-04-11 ENCOUNTER — Ambulatory Visit (INDEPENDENT_AMBULATORY_CARE_PROVIDER_SITE_OTHER): Payer: Medicare Other

## 2022-04-11 DIAGNOSIS — R059 Cough, unspecified: Secondary | ICD-10-CM

## 2022-04-11 DIAGNOSIS — J441 Chronic obstructive pulmonary disease with (acute) exacerbation: Secondary | ICD-10-CM | POA: Diagnosis not present

## 2022-04-11 DIAGNOSIS — R0602 Shortness of breath: Secondary | ICD-10-CM | POA: Diagnosis not present

## 2022-04-11 MED ORDER — PROMETHAZINE-DM 6.25-15 MG/5ML PO SYRP: 5.0000 mL | ORAL_SOLUTION | Freq: Four times a day (QID) | ORAL | 0 refills | Status: DC | PRN

## 2022-04-11 MED ORDER — IPRATROPIUM-ALBUTEROL 0.5-2.5 (3) MG/3ML IN SOLN: 3.0000 mL | Freq: Once | RESPIRATORY_TRACT | Status: DC

## 2022-04-11 MED ORDER — PREDNISONE 20 MG PO TABS
ORAL_TABLET | ORAL | 0 refills | Status: DC
Start: 2022-04-11 — End: 2022-04-22

## 2022-04-11 NOTE — ED Notes (Signed)
Patient states that he does not want a breathing treatment at this time that he has been doing that at home. Athena Masse, [place] was notified.  ?

## 2022-04-11 NOTE — Discharge Instructions (Signed)
-  Your chest x-ray is normal today.  No evidence of pneumonia. ?- It seems that you are still in a COPD exacerbation.  Continue all your home medications and inhalers, breathing treatments and continue to keep your follow-up with your pulmonologist.  See if they are able to see you a little sooner.  You are not stabilized on what you are taking. ?- I have increased your corticosteroids but you should let them know that we did increase them from what they had you on since your exacerbation is worse. ?- Purchase a pulse oximeter so that you can keep a track on your oxygen.  If it gets below 92%, need to go to the ER. ?

## 2022-04-11 NOTE — ED Provider Notes (Signed)
?[facility] ? ? ? ?CSN: [id number] ?Arrival date & time: 04/11/22  1031 ? ? ?  ? ?History   ?Chief Complaint ?Chief Complaint  ?Patient presents with  ? Shortness of Breath  ? ? ?HPI ?Eugene Blackburn is a 51 y.o. male presenting for COPD exacerbation.  Patient reports he has had recurrent COPD exacerbations since having  COVID 19 about 3 months ago.  Patient reports having influenza and a sinus infection in the past month.  He has been treated with multiple rounds of antibiotics.  Patient was seen by his pulmonologist Dr. Silas Flood on 03/27/2022.  He was given prednisone at that time.  Patient took 40 mg of prednisone for 5 days, 20 mg of prednisone for 5 days and then was advised to continue on 10 mg daily.  Patient reports that soon as he got down to 10 mg he started to have increased shortness of breath so he increased back up to 20 mg which did not seem strong enough and he believes he may need more corticosteroids.  In addition to that he is taking Spiriva, Advair and has albuterol.  States he has been doing albuterol nebulizers every 6 hours.  Patient is a former smoker and no longer smokes.  Has not smoked for the last 3 years.  Patient reports a chronic dry cough.  He denies any recent fevers.  Patient says he just feels very unwell and is felt especially poorly since having COVID-19.  He does have a follow-up appointment with his pulmonologist but not for 2 more weeks.  Patient says he is miserable.  Other medical history significant for hyperlipidemia, obesity, pancreatic insufficiency. ? ?HPI ? ?Past Medical History:  ?Diagnosis Date  ? Asthma   ? Chronic idiopathic constipation   ? Chronic obstruct airways disease ([place])   ? Chronic pancreatitis ([place])   ? Degenerative disorder of muscle   ? Hearing loss   ? Hemorrhoids   ? Hyperlipidemia   ? Insomnia   ? Male erectile dysfunction   ? Nausea   ? Pancreas (digestive gland) works poorly   ? Seasonal allergic rhinitis   ? Vitamin D deficiency    ? ? ?There are no problems to display for this patient. ? ? ?Past Surgical History:  ?Procedure Laterality Date  ? PANCREAS SURGERY    ? ? ? ? ? ?Home Medications   ? ?Prior to Admission medications   ?Medication Sig Start Date End Date Taking? Authorizing Provider  ?predniSONE (DELTASONE) 20 MG tablet Take 40 mg p.o. x1 week and then 20 mg p.o. x1 week 04/11/22  Yes Danton Clap, PA-C  ?promethazine-dextromethorphan (PROMETHAZINE-DM) 6.25-15 MG/5ML syrup Take 5 mLs by mouth 4 (four) times daily as needed. 04/11/22  Yes Danton Clap, PA-C  ?Ascorbic Acid (VITAMIN C) 1000 MG tablet Take 1,000 mg by mouth daily.    [provider]  ?clonazePAM (KLONOPIN) 0.5 MG tablet Take 0.5 mg by mouth 2 (two) times daily as needed. 01/14/22   [provider]  ?ferrous sulfate 325 (65 FE) MG EC tablet Take 325 mg by mouth 3 (three) times daily with meals.    [provider]  ?fluticasone (FLONASE) 50 MCG/ACT nasal spray Place 1 spray into both nostrils daily.    [provider]  ?fluticasone-salmeterol (ADVAIR HFA) 230-21 MCG/ACT inhaler Inhale 2 puffs into the lungs 2 (two) times daily. 02/09/22   Hunsucker, Bonna Gains, MD  ?ibuprofen (ADVIL,MOTRIN) 600 MG tablet Take 1 tablet (600 mg  total) by mouth every 6 (six) hours as needed. 04/17/18   Melynda Ripple, MD  ?ipratropium (ATROVENT) 0.06 % nasal spray Place 2 sprays into both nostrils 4 (four) times daily. 03/12/22   Margarette Canada, NP  ?ipratropium-albuterol (DUONEB) 0.5-2.5 (3) MG/3ML SOLN Take 3 mLs by nebulization.    [provider]  ?linaclotide (LINZESS) 72 MCG capsule Take 72 mcg by mouth daily before breakfast.    [provider]  ?magnesium 30 MG tablet Take 30 mg by mouth 2 (two) times daily.    [provider]  ?oxyCODONE (OXYCONTIN) 20 mg 12 hr tablet Take 30 mg by mouth every 12 (twelve) hours.     [provider]  ?PANCREAZE 29562 units CPEP TAKE ONE CAPSULE BY MOUTH 3 TIMES A DAY 02/01/18    [provider]  ?pantoprazole (PROTONIX) 40 MG tablet Take 40 mg by mouth daily.    [provider]  ?ranitidine (ZANTAC) 75 MG tablet Take 75 mg by mouth 2 (two) times daily.    [provider]  ?tiotropium (SPIRIVA) 18 MCG inhalation capsule Place 18 mcg into inhaler and inhale daily.    [provider]  ?VENTOLIN HFA 108 (90 Base) MCG/ACT inhaler 2 puffs every 4 (four) hours as needed. Last taken: 1015am. Today. 02/25/18   [provider]  ? ? ?Family History ?Family History  ?Problem Relation Age of Onset  ? Lung cancer Mother   ? Pancreatic disease Father   ? ? ?Social History ?Social History  ? ?Tobacco Use  ? Smoking status: Former  ?  Types: Cigarettes  ? Smokeless tobacco: Never  ?Vaping Use  ? Vaping Use: Former  ?Substance Use Topics  ? Alcohol use: No  ?  Alcohol/week: 0.0 standard drinks  ? Drug use: No  ? ? ? ?Allergies   ?Gabapentin, Zithromax [azithromycin], Levaquin [levofloxacin], Penicillin g, and Penicillins ? ? ?Review of Systems ?Review of Systems  ?Constitutional:  Positive for fatigue. Negative for fever.  ?HENT:  Positive for congestion. Negative for rhinorrhea, sinus pressure, sinus pain and sore throat.   ?Respiratory:  Positive for cough, chest tightness, shortness of breath and wheezing.   ?Cardiovascular:  Negative for chest pain.  ?Gastrointestinal:  Negative for abdominal pain, diarrhea, nausea and vomiting.  ?Musculoskeletal:  Negative for myalgias.  ?Neurological:  Negative for weakness, light-headedness and headaches.  ?Hematological:  Negative for adenopathy.  ?Psychiatric/Behavioral:  Positive for sleep disturbance (due to cough and breathing difficulty).   ? ? ?Physical Exam ?Triage Vital Signs ?ED Triage Vitals  ?Enc Vitals Group  ?   BP 04/11/22 1053 95/77  ?   Pulse Rate 04/11/22 1053 (!) 143  ?   Resp 04/11/22 1053 (!) 23  ?   Temp 04/11/22 1053 99.1 ?F (37.3 ?C)  ?   Temp Source 04/11/22 1053 Oral  ?   SpO2 04/11/22 1053 96 %  ?    Weight 04/11/22 1055 230 lb (104.3 kg)  ?   Height 04/11/22 1055 6' (1.829 m)  ?   Head Circumference --   ?   Peak Flow --   ?   Pain Score 04/11/22 1054 0  ?   Pain Loc --   ?   Pain Edu? --   ?   Excl. in [place]? --   ? ?No data found. ? ?Updated Vital Signs ?BP 95/77 (BP Location: Left Arm)   Pulse (!) 143   Temp 99.1 ?F (37.3 ?C) (Oral)   Resp (!) 23  Ht 6' (1.829 m)   Wt 230 lb (104.3 kg)   SpO2 96%   BMI 31.19 kg/m?  ?   ? ?Physical Exam ?Vitals and nursing note reviewed.  ?Constitutional:   ?   General: He is not in acute distress. ?   Appearance: Normal appearance. He is well-developed. He is obese. He is ill-appearing.  ?HENT:  ?   Head: Normocephalic and atraumatic.  ?   Nose: Nose normal.  ?   Mouth/Throat:  ?   Mouth: Mucous membranes are moist.  ?   Pharynx: Oropharynx is clear.  ?Eyes:  ?   General: No scleral icterus. ?   Conjunctiva/sclera: Conjunctivae normal.  ?Cardiovascular:  ?   Rate and Rhythm: Regular rhythm. Tachycardia present.  ?   Heart sounds: Normal heart sounds.  ?Pulmonary:  ?   Effort: Pulmonary effort is normal. No respiratory distress (able to speak in full sentences).  ?   Breath sounds: Wheezing (diffuse scattered wheezes throughout) present.  ?   Comments: Increased RR ?Musculoskeletal:  ?   Cervical back: Neck supple.  ?Skin: ?   General: Skin is warm and dry.  ?   Capillary Refill: Capillary refill takes less than 2 seconds.  ?Neurological:  ?   General: No focal deficit present.  ?   Mental Status: He is alert. Mental status is at baseline.  ?   Motor: No weakness.  ?   Coordination: Coordination normal.  ?   Gait: Gait normal.  ?Psychiatric:     ?   Mood and Affect: Mood normal.     ?   Behavior: Behavior normal.     ?   Thought Content: Thought content normal.  ? ? ? ?UC Treatments / Results  ?Labs ?(all labs ordered are listed, but only abnormal results are displayed) ?Labs Reviewed - No data to display ? ?EKG ? ? ?Radiology ?DG Chest 2 View ? ?Result Date:  04/11/2022 ?CLINICAL DATA:  Patient stated that he had Covid last November. He was seen at the end of March and was put on Prednisone and Inhalers for SOB. He stated that he felt a lot better with the medications and since he

## 2022-04-11 NOTE — ED Triage Notes (Signed)
Patient stated that he had Covid last November. He was seen at the end of March and was put on Prednisone and Inhalers for SOB. He stated that he felt a lot better with the medications and since he has run out of the prednisone he is back to where he was before with his SOB. He was treated in UC on 03/12/22 with sudafed and mucinex for nasal congestion and cough.Patient states he currently has a dry cough, SOB, denies fever. ?

## 2022-04-18 ENCOUNTER — Ambulatory Visit
Admission: EM | Admit: 2022-04-18 | Discharge: 2022-04-18 | Disposition: A | Discharge status: Home / Self Care | Payer: Medicare Other | Attending: Emergency Medicine | Admitting: Emergency Medicine

## 2022-04-18 ENCOUNTER — Other Ambulatory Visit: Payer: Self-pay

## 2022-04-18 DIAGNOSIS — B370 Candidal stomatitis: Secondary | ICD-10-CM | POA: Diagnosis present

## 2022-04-18 DIAGNOSIS — J329 Chronic sinusitis, unspecified: Secondary | ICD-10-CM

## 2022-04-18 DIAGNOSIS — B9689 Other specified bacterial agents as the cause of diseases classified elsewhere: Secondary | ICD-10-CM | POA: Diagnosis present

## 2022-04-18 LAB — GROUP A STREP BY PCR: Group A Strep by PCR: NOT DETECTED

## 2022-04-18 MED ORDER — DOXYCYCLINE HYCLATE 100 MG PO TABS: 100.0000 mg | ORAL_TABLET | Freq: Two times a day (BID) | ORAL | 0 refills | Status: DC

## 2022-04-18 MED ORDER — NYSTATIN 100000 UNIT/ML MT SUSP: 5.0000 mL | Freq: Four times a day (QID) | OROMUCOSAL | 0 refills | Status: DC

## 2022-04-18 MED ORDER — PREDNISONE 10 MG PO TABS: 10.0000 mg | ORAL_TABLET | ORAL | 0 refills | Status: DC

## 2022-04-18 NOTE — ED Triage Notes (Signed)
Patient is here today due to "sore throat". Saw white spots in back ot throat and uvula. Recent Fever "on 15 th when seen here".  ?

## 2022-04-18 NOTE — ED Provider Notes (Signed)
? ?[facility] ?Provider Note ? ?Patient Contact: 11:24 AM (approximate) ? ? ?History  ? ?Sore Throat ? ? ?HPI ? ?Eugene Blackburn is a 51 y.o. male who presents to the urgent care complaining of sore throat, likely thrush as well as a possible sinus infection.  Patient states that since in the last year when he had COVID he has had ongoing issues and is seeing his pulmonologist for same.  Patient had recent exacerbation with some bronchitis and has been on prednisone, he takes daily inhalers.  He states that he has been trying to do good mouth care to prevent thrush but believes that he has thrush as he has some white plaque on the roof of his mouth, the back of his tongue, his oropharynx.  He has a slight sore throat but states that he has had thrush before with similar symptoms.  Patient also believes he may have a sinus infection currently as he has had nasal congestion and sinus pressure.  Patient has been afebrile at home.  His respiratory rate and pulse rate is up but it appears that this is patient's norm looking at previous encounters.  Patient states that he sees his pulmonologist in 4 days. ?  ? ? ?Physical Exam  ? ?Triage Vital Signs: ?ED Triage Vitals  ?Enc Vitals Group  ?   BP 04/18/22 1103 127/74  ?   Pulse Rate 04/18/22 1103 (!) 134  ?   Resp 04/18/22 1103 (!) 22  ?   Temp 04/18/22 1103 98.7 ?F (37.1 ?C)  ?   Temp Source 04/18/22 1103 Oral  ?   SpO2 04/18/22 1103 92 %  ?   Weight 04/18/22 1101 227 lb (103 kg)  ?   Height 04/18/22 1101 6' (1.829 m)  ?   Head Circumference --   ?   Peak Flow --   ?   Pain Score 04/18/22 1058 2  ?   Pain Loc --   ?   Pain Edu? --   ?   Excl. in GC? --   ? ? ?Most recent vital signs: ?Vitals:  ? 04/18/22 1103 04/18/22 1105  ?BP: 127/74   ?Pulse: (!) 134 (!) 134  ?Resp: (!) 22   ?Temp: 98.7 ?F (37.1 ?C)   ?SpO2: 92% 93%  ? ? ? ?General: Alert and in no acute distress. ?ENT: ?     Ears: Right EAC with some mild cerumen but otherwise both EACs are  reassuring.  TMs are slightly bulging bilaterally but no injection. ?     Nose: Moderate congestion/rhinnorhea.  Sharp tenderness to percussion over the frontal sinuses ?     Mouth/Throat: Mucous membranes are moist.  Patient has white plaque in the roof of the mouth, oropharynx and posterior tongue consistent with thrush ?Neck: No stridor. No cervical spine tenderness to palpation ?Hematological/Lymphatic/Immunilogical: No cervical lymphadenopathy. ?Cardiovascular:  Good peripheral perfusion ?Respiratory: Normal respiratory effort without tachypnea or retractions. Lungs with slight expiratory wheeze in the right upper lung field otherwise no adventitious lung sounds.Peri [place] air entry to the bases with no decreased or absent breath sounds ?Musculoskeletal: Full range of motion to all extremities.  ?Neurologic:  No gross focal neurologic deficits are appreciated.  ?Skin:   No rash noted ?Other: ? ? ?ED Results / Procedures / Treatments  ? ?Labs ?(all labs ordered are listed, but only abnormal results are displayed) ?Labs Reviewed  ?GROUP A STREP BY PCR  ? ? ? ?EKG ? ? ? ? ?  RADIOLOGY ? ? ? ?No results found. ? ?PROCEDURES: ? ?Critical Care performed: No ? ?Procedures ? ? ?MEDICATIONS ORDERED IN ED: ?Medications - No data to display ? ? ?IMPRESSION / MDM / ASSESSMENT AND PLAN / ED COURSE  ?I reviewed the triage vital signs and the nursing notes. ?             ?               ? ?Differential diagnosis includes, but is not limited to, sinusitis, allergic rhinitis, otitis media, strep pharyngitis, COVID/flu, thrush ? ? ?Patient's diagnosis is consistent with thrush, bacterial sinusitis.  Patient presents to the urgent care complaining of sinus pressure, as well as possible thrush.  Patient is on multiple inhalers for his respiratory symptoms such as COPD and apparent long-haul COVID.  Patient has had thrush in the past secondary to his inhaler use.  He tries to do good oral care but states that he believes he has thrush at  this time.  Physical exam was concerning for bacterial sinusitis with tenderness over the sinuses with increased congestion, sinus pressure and eustachian tube dysfunction.  I will place the patient on antibiotics for his sinusitis, oral nystatin for his thrush.  Patient's pulse rate and respiratory rate is up but it appears that this is patient's norm compared to previous encounters.  Patient states that he becomes anxious.  No evidence of sepsis with fevers, altered mental status.  He is not hypotensive, O2 saturation the patient's baseline.  Concerning signs and symptoms are discussed with the patient.  Follow-up with the emergency department if symptoms worsen.. Patient will be discharged home with prescriptions for oral nystatin, Augmentin, prednisone. Patient is to follow up with primary care or pulmonology as needed or otherwise directed. Patient is given ED precautions to return to the ED for any worsening or new symptoms. ? ? ?  ? ? ?FINAL CLINICAL IMPRESSION(S) / ED DIAGNOSES  ? ?Final diagnoses:  ?Bacterial sinusitis  ?Thrush  ? ? ? ?Rx / DC Orders  ? ?ED Discharge Orders   ? ?      Ordered  ?  doxycycline (VIBRA-TABS) 100 MG tablet  2 times daily       ? 04/18/22 1131  ?  nystatin (MYCOSTATIN) 100000 UNIT/ML suspension  4 times daily       ? 04/18/22 1131  ?  predniSONE (DELTASONE) 10 MG tablet  As directed       ? 04/18/22 1131  ? ?  ?  ? ?  ? ? ? ?Note:  This document was prepared using Dragon voice recognition software and may include unintentional dictation errors. ?  ?Racheal Patches, PA-C ?04/18/22 1132 ? ?

## 2022-04-20 ENCOUNTER — Emergency Department
Admission: EM | Admit: 2022-04-20 | Discharge: 2022-04-20 | Discharge status: Against Medical Advice | Payer: Medicare Other | Attending: Emergency Medicine | Admitting: Emergency Medicine

## 2022-04-20 ENCOUNTER — Emergency Department: Payer: Medicare Other

## 2022-04-20 ENCOUNTER — Other Ambulatory Visit: Payer: Self-pay

## 2022-04-20 DIAGNOSIS — J40 Bronchitis, not specified as acute or chronic: Secondary | ICD-10-CM

## 2022-04-20 DIAGNOSIS — J441 Chronic obstructive pulmonary disease with (acute) exacerbation: Secondary | ICD-10-CM

## 2022-04-20 DIAGNOSIS — J45909 Unspecified asthma, uncomplicated: Secondary | ICD-10-CM | POA: Insufficient documentation

## 2022-04-20 DIAGNOSIS — Z8616 Personal history of COVID-19: Secondary | ICD-10-CM | POA: Diagnosis not present

## 2022-04-20 DIAGNOSIS — R0602 Shortness of breath: Secondary | ICD-10-CM | POA: Diagnosis present

## 2022-04-20 DIAGNOSIS — R600 Localized edema: Secondary | ICD-10-CM | POA: Insufficient documentation

## 2022-04-20 LAB — CBC
HCT: 47.9 % (ref 39.0–52.0)
Hemoglobin: 15.1 g/dL (ref 13.0–17.0)
MCH: 31.3 pg (ref 26.0–34.0)
MCHC: 31.5 g/dL (ref 30.0–36.0)
MCV: 99.4 fL (ref 80.0–100.0)
Platelets: 291 10*3/uL (ref 150–400)
RBC: 4.82 MIL/uL (ref 4.22–5.81)
RDW: 14.6 % (ref 11.5–15.5)
WBC: 15.8 10*3/uL — ABNORMAL HIGH (ref 4.0–10.5)
nRBC: 0 % (ref 0.0–0.2)

## 2022-04-20 LAB — BRAIN NATRIURETIC PEPTIDE: B Natriuretic Peptide: 41.1 pg/mL (ref 0.0–100.0)

## 2022-04-20 LAB — BASIC METABOLIC PANEL
Anion gap: 9 (ref 5–15)
BUN: 22 mg/dL — ABNORMAL HIGH (ref 6–20)
CO2: 35 mmol/L — ABNORMAL HIGH (ref 22–32)
Calcium: 9 mg/dL (ref 8.9–10.3)
Chloride: 92 mmol/L — ABNORMAL LOW (ref 98–111)
Creatinine, Ser: 1.11 mg/dL (ref 0.61–1.24)
GFR, Estimated: >60 mL/min (ref >60)
Glucose, Bld: 101 mg/dL — ABNORMAL HIGH (ref 70–99)
Potassium: 4.3 mmol/L (ref 3.5–5.1)
Sodium: 136 mmol/L (ref 135–145)

## 2022-04-20 LAB — TROPONIN I (HIGH SENSITIVITY)
Troponin I (High Sensitivity): 17 ng/L (ref <18)
Troponin I (High Sensitivity): 15 ng/L (ref <18)

## 2022-04-20 MED ORDER — PREDNISONE 50 MG PO TABS: ORAL_TABLET | ORAL | 0 refills | Status: DC

## 2022-04-20 MED ORDER — IPRATROPIUM-ALBUTEROL 0.5-2.5 (3) MG/3ML IN SOLN
3.0000 mL | Freq: Once | RESPIRATORY_TRACT | Status: AC
  Administered 2022-04-20: 3 mL via RESPIRATORY_TRACT
  Filled 2022-04-20: qty 3

## 2022-04-20 MED ORDER — PROMETHAZINE-DM 6.25-15 MG/5ML PO SYRP: 1.2500 mL | ORAL_SOLUTION | Freq: Four times a day (QID) | ORAL | 0 refills | Status: DC | PRN

## 2022-04-20 MED ORDER — IPRATROPIUM-ALBUTEROL 0.5-2.5 (3) MG/3ML IN SOLN
3.0000 mL | Freq: Once | RESPIRATORY_TRACT | Status: AC
Start: 2022-04-20 — End: 2022-04-20
  Administered 2022-04-20: 3 mL via RESPIRATORY_TRACT
  Filled 2022-04-20: qty 3

## 2022-04-20 MED ORDER — PREDNISONE 20 MG PO TABS
60.0000 mg | ORAL_TABLET | Freq: Once | ORAL | Status: AC
  Administered 2022-04-20: 60 mg via ORAL
  Filled 2022-04-20: qty 3

## 2022-04-20 NOTE — ED Triage Notes (Signed)
Pt states has been shob since he caught covid in November. Pt denies known fever, states is having orthopnea. Pt is able to speak in full sentences. States has been having tightness in chest with shob.  ?

## 2022-04-20 NOTE — Discharge Instructions (Addendum)
Your oxygen levels were borderline today which is why recommended that you stay for admission.  Please take higher dose of prednisone 40 to 60 mg for the next 5 days or until you follow-up with your pulmonologist. ?

## 2022-04-20 NOTE — ED Notes (Signed)
X-ray at bedside

## 2022-04-20 NOTE — ED Notes (Addendum)
This RN at bedside due to pt oxygen sat reading 83% and breathing tx finished. This RN placed pt back on 2L [place]. Pt took [place] off. Pt reports that the breathing tx made him feel worse and believes he needs to stop them at home. Pt did place [place] back on prior to this RN leaving room. MD at bedside.  ?

## 2022-04-20 NOTE — ED Provider Notes (Signed)
? ?[facility] ?Provider Note ? ? ? Event Date/Time  ? First MD Initiated Contact with Patient 04/20/22 [id number]   ?  (approximate) ? ? ?History  ? ?Shortness of Breath ? ? ?HPI ? ?Eugene Blackburn is a 51 y.o. male recurrent bronchitis likely due to underlying emphysema and long COVID who presents with shortness of breath.  This has been a chronic issue for him since having COVID last year.  Has had ongoing issues with bronchitis and has been on steroids chronically. Patient sees pulmonologist Dr. Judeth HornHunsucker last was seen about a month ago.  At this time was having worsening symptoms and was prescribed a prednisone taper which was meant to and at 10 mg daily.  There was discussion that he may need Biologics. ? ?Patient recently seen in urgent care prescribed promethazine dextromethorphan for cough.  Says that this seemed to help significantly and that he had been able to cough up his sputum.  He ran out of this 2 days ago and since that time cough is now dry and he has had ongoing difficulty and worsening shortness of breath.  Does not typically wear oxygen.  Denies fevers chest pain.  Does have lower extreme edema which he says is chronic for him.  He is currently on 10 mg of prednisone.  After going to urgent care had been on a higher dose for 2 days briefly but then went back to 10 mg instead of doing the taper.  He sees his pulmonologist this Wednesday.  Does use Advair and Spiriva albuterol as needed.  Has been compliant with his medications.  Is no longer using tobacco.  Tells me that his heart rate is typically in the 110s to 120s has been for all his life. ? ? ? ?  ? ?Past Medical History:  ?Diagnosis Date  ? Asthma   ? Chronic idiopathic constipation   ? Chronic obstruct airways disease (HCC)   ? Chronic pancreatitis (HCC)   ? Degenerative disorder of muscle   ? Hearing loss   ? Hemorrhoids   ? Hyperlipidemia   ? Insomnia   ? Male erectile dysfunction   ? Nausea   ? Pancreas (digestive  gland) works poorly   ? Seasonal allergic rhinitis   ? Vitamin D deficiency   ? ? ?Patient Active Problem List  ? Diagnosis Date Noted  ? Tremor 01/26/2019  ? Numbness and tingling of both legs 01/26/2019  ? ? ? ?Physical Exam  ?Triage Vital Signs: ?ED Triage Vitals  ?Enc Vitals Group  ?   BP 04/20/22 0643 138/85  ?   Pulse Rate 04/20/22 0643 (!) 124  ?   Resp 04/20/22 0643 (!) 22  ?   Temp 04/20/22 0646 97.9 ?F (36.6 ?C)  ?   Temp Source 04/20/22 0646 Oral  ?   SpO2 04/20/22 0643 92 %  ?   Weight 04/20/22 0642 227 lb (103 kg)  ?   Height 04/20/22 0642 6' (1.829 m)  ?   Head Circumference --   ?   Peak Flow --   ?   Pain Score --   ?   Pain Loc --   ?   Pain Edu? --   ?   Excl. in GC? --   ? ? ?Most recent vital signs: ?Vitals:  ? 04/20/22 0800 04/20/22 0815  ?BP: 132/83   ?Pulse: (!) 115 (!) 114  ?Resp: (!) 28   ?Temp:    ?SpO2: 93% 100%  ? ? ? ?  General: Awake, no distress.  ?CV:  Good peripheral perfusion. 1+ edema BL ?Resp:  Mildly dyspneic with talking, breath sounds are diminished throughout with mild exp wheeze ?Abd:  No distention.  ?Neuro:             Awake, Alert, Oriented x 3  ?Other:   ? ? ?ED Results / Procedures / Treatments  ?Labs ?(all labs ordered are listed, but only abnormal results are displayed) ?Labs Reviewed  ?BASIC METABOLIC PANEL - Abnormal; Notable for the following components:  ?    Result Value  ? Chloride 92 (*)   ? CO2 35 (*)   ? Glucose, Bld 101 (*)   ? BUN 22 (*)   ? All other components within normal limits  ?CBC - Abnormal; Notable for the following components:  ? WBC 15.8 (*)   ? All other components within normal limits  ?BRAIN NATRIURETIC PEPTIDE  ?TROPONIN I (HIGH SENSITIVITY)  ?TROPONIN I (HIGH SENSITIVITY)  ? ? ? ?EKG ?EKG interpreted by myself, sinus tachycardia normal axis normal intervals, unifocal PVC ? ? ?RADIOLOGY ?Reviewed the chest x-ray which shows a prominent stomach bubble no acute intrathoracic process ? ? ?PROCEDURES: ? ?Critical Care performed: No ? ?.1-3 Lead EKG  Interpretation ?Performed by: Georga Hacking, MD ?Authorized by: Georga Hacking, MD  ? ?  Interpretation: abnormal   ?  ECG rate assessment: tachycardic   ?  Rhythm: sinus tachycardia   ?  Ectopy: none   ?  Conduction: normal   ? ?The patient is on the cardiac monitor to evaluate for evidence of arrhythmia and/or significant heart rate changes. ? ? ?MEDICATIONS ORDERED IN ED: ?Medications  ?ipratropium-albuterol (DUONEB) 0.5-2.5 (3) MG/3ML nebulizer solution 3 mL (3 mLs Nebulization Given 04/20/22 0806)  ?ipratropium-albuterol (DUONEB) 0.5-2.5 (3) MG/3ML nebulizer solution 3 mL (3 mLs Nebulization Given 04/20/22 0806)  ?ipratropium-albuterol (DUONEB) 0.5-2.5 (3) MG/3ML nebulizer solution 3 mL (3 mLs Nebulization Given 04/20/22 0805)  ?predniSONE (DELTASONE) tablet 60 mg (60 mg Oral Given 04/20/22 0806)  ? ? ? ?IMPRESSION / MDM / ASSESSMENT AND PLAN / ED COURSE  ?I reviewed the triage vital signs and the nursing notes. ?             ?               ? ?Differential diagnosis includes, but is not limited to, COPD exacerbation, pneumonia, CHF, less likely PE or ACS ? ?Patient is a 51 year old male with underlying COPD which seems to have been brought on by COVID-19 infection last year presenting with ongoing cough and dyspnea.  Patient has been battling multiple episodes of bronchitis and steroid tapers over the last several months.  Seeing pulmonologist Dr. Judeth Horn with .  Currently on 10 mg of pred daily although this was increased after visit to urgent care several days ago but patient only took 2 days of higher dose and then went back down to the 10 mg.  Has had difficulty getting his sputum up since he ran out of promethazine dextromethorphan , which I see that he has been prescribed fairly chronically.  Patient's initial sat is 89% on room air mildly tachypneic also tachycardia to the 120s.  Patient tells me that he is chronically tachycardic and on review of last several encounters with healthcare  system including with pulmonologist urgent care and ED visits he is persistently tachycardic.  He is mildly dyspneic lung sounds diminished with some wheezing.  Does have some lower edema as well.  We will treat with DuoNebs and higher dose of prednisone.  Will check chest x-ray and labs.  Given the most recent notes I suspect patient is not far off of his baseline.  I am reassured that he has close follow-up with pulmonology that we may be able to get him home. ? ?Chest x-ray does not show any acute process.  He has a mild leukocytosis to 15 likely in the setting of steroid use.  CMP showing elevated bicarb likely in the setting of chronic hypercarbia from COPD.  His EKG shows sinus tachycardia. ? ?  ?After nebs patient says he feels worse.  Says that the breathing treatment is why his oxygen is lower and that breathing treatments make him feel worse at home as well thinks it is just too much for him.  Off oxygen he is satting 88 to 92%.  Still tachycardic.  Reviewed recent ED visits has had a CTA to rule out PE 2 months ago.  My suspicion for PE overall is low given the symptoms have been chronic.  I did recommend that the patient stay for admission given he is hypoxic.  Discussed that when oxygen drops below 88% he is at higher risk for having bad outcomes including MI and death.  Ultimately he did not want to stay and understood the risks.  Has small dogs at home and was unwilling to leave them alone.  Ultimately decided to leave AGAINST MEDICAL ADVICE.  Will prescribe a higher dose of Pred until he can follow-up with his pulmonologist and his cough medication. ? ?While waiting papers, with sleep patient does desat to the high 70s.  I pointed this out to him and again discussed my concerns about him leaving.  He understood.  ? ?FINAL CLINICAL IMPRESSION(S) / ED DIAGNOSES  ? ?Final diagnoses:  ?Bronchitis  ?COPD exacerbation (HCC)  ? ? ? ?Rx / DC Orders  ? ?ED Discharge Orders   ? ? None  ? ?  ? ? ? ?Note:  This  document was prepared using Dragon voice recognition software and may include unintentional dictation errors. ?  ?Georga Hacking, MD ?04/20/22 [phone] ? ?

## 2022-04-20 NOTE — ED Notes (Signed)
Pt RA sats reading 83%. MD aware pt requesting to not put [place] back on.  ?

## 2022-04-22 ENCOUNTER — Ambulatory Visit (INDEPENDENT_AMBULATORY_CARE_PROVIDER_SITE_OTHER): Payer: Medicare Other | Admitting: Pulmonary Disease

## 2022-04-22 ENCOUNTER — Encounter: Payer: Self-pay | Admitting: Pulmonary Disease

## 2022-04-22 VITALS — BP 126/70 | HR 117 | Temp 98.6°F | Wt 226.0 lb

## 2022-04-22 DIAGNOSIS — J4550 Severe persistent asthma, uncomplicated: Secondary | ICD-10-CM | POA: Diagnosis not present

## 2022-04-22 MED ORDER — PROMETHAZINE-DM 6.25-15 MG/5ML PO SYRP: 5.0000 mL | ORAL_SOLUTION | Freq: Every evening | ORAL | 0 refills | Status: DC | PRN

## 2022-04-22 MED ORDER — PREDNISONE 20 MG PO TABS: ORAL_TABLET | ORAL | 0 refills | Status: AC

## 2022-04-22 NOTE — Patient Instructions (Addendum)
Nice to see you again ? ?We will decrease prednisone 40 mg daily for 2 weeks, then 30 mg daily for 2 weeks, then plan to get the 20 mg and stay there. ? ?We will fill out paperwork today for test prior, and injection to help decrease inflammation and reduce steroids.  I think a lot of your issues are more related to asthma.  This medication can be very effective for this. ? ?You should hear more from my pharmacy colleagues in the coming days a week. ? ?I refilled the cough syrup, please use at night. ? ?Return to clinic in 2 months or sooner as needed with Dr. Judeth Horn ? ?

## 2022-04-22 NOTE — Progress Notes (Signed)
? ?@Patient  ID: Eugene Blackburn, male    DOB: August 21, 1971, 51 y.o.   MRN: [id number] ? ?Chief Complaint  ?Patient presents with  ? Follow-up  ?  Follow up. Pt states he has been in and out of urgent care. He thinks its the albuterol neb treatments that is triggering his attacks.   ? ? ?Referring provider: ?Remi Haggard, FNP ? ?HPI:  ? ?51 y.o. man whom we are seeing in follow up for evaluation of dyspnea on exertion.  Recent ED note  reviewed. ? ?Patient again recently back in the ED for flare of breathing.  Again with decreasing prednisone.  Currently on 50 mg daily.  Notes he feels like DuoNebs makes things worse when he takes at home and again noted this was worse after using in the ED.  We discussed at length concern for recurrent exacerbations with high-dose prednisone use more typical for asthma than COPD.  Notably he is wheezing on exam despite prednisone use.  Discussed role and rationale for biologic therapy and after shared decision making agreed to move forward with [place]. ? ?HPI at initial visit: ?Patient diagnosed with COVID several weeks ago.  Late December 2022.  Since then had worsening cough, shortness of breath.  Some symptoms of improved but dyspnea lingers.  Presented to ED multiple times.  Has been on prednisone essentially continuously over the last several weeks.  Most doses above 20 mg.  Thinks it helped some but as he decreases symptoms seem to get worse.  Currently using 20 mg daily.  Inhaler can also be working as well.  He does like the Spiriva thinks it helps more than the Advair discus.  Seems bit worse with cough in the morning and at night.  He is stop smoking in the setting of all this.  Reviewed his cross-sectional imaging in the ED 02/08/2022 and on my review interpretation reveals scattered atelectasis, no clear infiltrate, no evidence of PE, mild emphysematous changes in the apices.  Most recent chest x-ray 02/05/2022 reviewed and interpreted as clear lungs. ? 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 ?Questionaires / Pulmonary Flowsheets:  ? ?ACT:  ?   ? View : No data to display.  ?  ?  ?  ? ? ? ?MMRC: ?   ? View : No data to display.  ?  ?  ?  ? ? ? ?Epworth:  ?   ? View : No data to display.  ?  ?  ?  ? ? ? ?Tests:  ? ?FENO:  ?No results found for: NITRICOXIDE ? ?PFT: ?   ? View : No data to display.  ?  ?  ?  ? ? ? ?WALK:  ?   ? View : No data to display.  ?  ?  ?  ? ? ? ?Imaging: ?Personally reviewed and as per EMR discussion this note ?DG Chest 1 View ? ?Result Date: 04/20/2022 ?CLINICAL DATA:  Shortness of breath. EXAM: CHEST  1 VIEW COMPARISON:  04/11/2022 FINDINGS: 0652 hours. The lungs are clear without focal pneumonia, edema, pneumothorax or pleural effusion. Asymmetric elevation left hemidiaphragm with gaseous distension of stomach and/or colon below the left hemidiaphragm. Telemetry leads overlie the chest. IMPRESSION: No acute cardiopulmonary findings. Stable asymmetric elevation left hemidiaphragm with gaseous distension of stomach. Electronically Signed  By: Misty Stanley M.D.   On: 04/20/2022 07:07  ? ?DG Chest 2 View ? ?Result Date: 04/11/2022 ?CLINICAL DATA:  Patient stated that he had Covid last November. He was seen at the end of March and was put on Prednisone and Inhalers for SOB. He stated that he felt a lot better with the medications and since he has run out of the prednisone he is back to where he was before with his SOB. He was treated in UC on 03/12/22 with sudafed and mucinex for nasal congestion and cough.Patient states he currently has a dry cough, SOB, denies fever. EXAM: CHEST - 2 VIEW COMPARISON:  03/27/2022. FINDINGS: Normal heart, mediastinum and hila. Clear lungs.  No pleural effusion or pneumothorax. Elevated left  hemidiaphragm with mild distension of the stomach. Skeletal structures are intact. IMPRESSION: No active cardiopulmonary disease. Electronically Signed   By: Lajean Manes M.D.   On: 04/11/2022 11:22  ? ?DG Chest 2 View ? ?Result Date: 03/29/2022 ?CLINICAL DATA:  Cough EXAM: CHEST - 2 VIEW COMPARISON:  Chest x-ray dated February 05, 2022 FINDINGS: Cardiac and mediastinal contours are within normal limits. Unchanged elevation of the left hemidiaphragm. Unchanged blunting of the right costophrenic angle, likely due to pleural thickening. Lungs are clear. No pleural effusion or pneumothorax. IMPRESSION: No active cardiopulmonary disease. Electronically Signed   By: Yetta Glassman M.D.   On: 03/29/2022 17:25   ? ?Lab Results: ?Personally reviewed ?CBC ?   ?Component Value Date/Time  ? WBC 15.8 (H) 04/20/2022 0650  ? RBC 4.82 04/20/2022 0650  ? HGB 15.1 04/20/2022 0650  ? HGB 15.6 09/25/2013 1641  ? HCT 47.9 04/20/2022 0650  ? HCT 47.6 09/25/2013 1641  ? PLT 291 04/20/2022 0650  ? PLT 264 09/25/2013 1641  ? MCV 99.4 04/20/2022 0650  ? MCV 89 09/25/2013 1641  ? MCH 31.3 04/20/2022 0650  ? MCHC 31.5 04/20/2022 0650  ? RDW 14.6 04/20/2022 0650  ? RDW 13.9 09/25/2013 1641  ? LYMPHSABS 0.8 02/05/2022 0605  ? LYMPHSABS 2.6 09/25/2013 1641  ? MONOABS 0.9 02/05/2022 0605  ? MONOABS 0.6 09/25/2013 1641  ? EOSABS 0.1 02/05/2022 0605  ? EOSABS 0.2 09/25/2013 1641  ? BASOSABS 0.0 02/05/2022 [id number]  ? BASOSABS 0.1 09/25/2013 1641  ? ? ?BMET ?   ?Component Value Date/Time  ? NA 136 04/20/2022 0650  ? K 4.3 04/20/2022 0650  ? CL 92 (L) 04/20/2022 0650  ? CO2 35 (H) 04/20/2022 0650  ? GLUCOSE 101 (H) 04/20/2022 0650  ? BUN 22 (H) 04/20/2022 0650  ? CREATININE 1.11 04/20/2022 0650  ? CALCIUM 9.0 04/20/2022 0650  ? GFRNONAA >60 04/20/2022 0650  ? GFRAA >60 03/10/2020 1101  ? ? ?BNP ?   ?Component Value Date/Time  ? BNP 41.1 04/20/2022 0650  ? ? ?ProBNP ?No results found for: PROBNP ? ?Specialty Problems   ?None ? ? ?Allergies  ?Allergen  Reactions  ? Gabapentin Shortness Of Breath  ? Zithromax [Azithromycin] Rash  ?  Patient states azithromycin caused him to "bleed"  ? Levaquin [Levofloxacin] Other (See Comments)  ?  Tendon problem  ? Penicillin G Hives  ? Penicillins Itching and Rash  ? ? ? ?There is no immunization history on file for this patient. ? ?Past Medical History:  ?Diagnosis Date  ? Asthma   ? Chronic idiopathic constipation   ? Chronic obstruct airways disease ([place])   ? Chronic pancreatitis ([place])   ? Degenerative disorder of muscle   ? Hearing loss   ?  Hemorrhoids   ? Hyperlipidemia   ? Insomnia   ? Male erectile dysfunction   ? Nausea   ? Pancreas (digestive gland) works poorly   ? Seasonal allergic rhinitis   ? Vitamin D deficiency   ? ? ?Tobacco History: ?Social History  ? ?Tobacco Use  ?Smoking Status Former  ? Types: Cigarettes  ?Smokeless Tobacco Never  ? ?Counseling given: Not Answered ? ? ?Continue to not smoke ? ?Outpatient Encounter Medications as of 04/22/2022  ?Medication Sig  ? clonazePAM (KLONOPIN) 0.5 MG tablet Take 0.5 mg by mouth 2 (two) times daily as needed.  ? doxycycline (VIBRA-TABS) 100 MG tablet Take 1 tablet (100 mg total) by mouth 2 (two) times daily.  ? fluticasone (FLONASE) 50 MCG/ACT nasal spray Place 1 spray into both nostrils daily.  ? fluticasone-salmeterol (ADVAIR HFA) 230-21 MCG/ACT inhaler Inhale 2 puffs into the lungs 2 (two) times daily.  ? ibuprofen (ADVIL,MOTRIN) 600 MG tablet Take 1 tablet (600 mg total) by mouth every 6 (six) hours as needed.  ? ipratropium (ATROVENT) 0.06 % nasal spray Place 2 sprays into both nostrils 4 (four) times daily.  ? ipratropium-albuterol (DUONEB) 0.5-2.5 (3) MG/3ML SOLN Take 3 mLs by nebulization.  ? LINZESS 145 MCG CAPS capsule Take 145 mcg by mouth daily as needed.  ? nystatin (MYCOSTATIN) 100000 UNIT/ML suspension Take 5 mLs (500,000 Units total) by mouth 4 (four) times daily.  ? ondansetron (ZOFRAN) 4 MG tablet Take 4 mg by mouth every 6 (six) hours as needed.  ?  oxyCODONE (OXYCONTIN) 20 mg 12 hr tablet Take 30 mg by mouth every 12 (twelve) hours.   ? PANCREAZE 51884 units CPEP TAKE ONE CAPSULE BY MOUTH 3 TIMES A DAY  ? pantoprazole (PROTONIX) 40 MG tablet Take 40 mg

## 2022-04-24 ENCOUNTER — Ambulatory Visit: Payer: Medicare Other | Admitting: Pulmonary Disease

## 2022-04-27 ENCOUNTER — Ambulatory Visit: Payer: Medicare Other

## 2022-04-27 ENCOUNTER — Ambulatory Visit
Admission: EM | Admit: 2022-04-27 | Discharge: 2022-04-27 | Disposition: A | Discharge status: Home / Self Care | Payer: Medicare Other | Attending: Physician Assistant | Admitting: Physician Assistant

## 2022-04-27 ENCOUNTER — Telehealth: Payer: Self-pay | Admitting: Pulmonary Disease

## 2022-04-27 DIAGNOSIS — J449 Chronic obstructive pulmonary disease, unspecified: Secondary | ICD-10-CM | POA: Diagnosis not present

## 2022-04-27 DIAGNOSIS — M7989 Other specified soft tissue disorders: Secondary | ICD-10-CM | POA: Insufficient documentation

## 2022-04-27 DIAGNOSIS — R600 Localized edema: Secondary | ICD-10-CM | POA: Diagnosis not present

## 2022-04-27 DIAGNOSIS — R609 Edema, unspecified: Secondary | ICD-10-CM | POA: Diagnosis not present

## 2022-04-27 DIAGNOSIS — Z7952 Long term (current) use of systemic steroids: Secondary | ICD-10-CM | POA: Insufficient documentation

## 2022-04-27 LAB — COMPREHENSIVE METABOLIC PANEL
ALT: 20 U/L (ref 0–44)
AST: 16 U/L (ref 15–41)
Albumin: 4.2 g/dL (ref 3.5–5.0)
Alkaline Phosphatase: 43 U/L (ref 38–126)
Anion gap: 7 (ref 5–15)
BUN: 22 mg/dL — ABNORMAL HIGH (ref 6–20)
CO2: 36 mmol/L — ABNORMAL HIGH (ref 22–32)
Calcium: 9.6 mg/dL (ref 8.9–10.3)
Chloride: 91 mmol/L — ABNORMAL LOW (ref 98–111)
Creatinine, Ser: 1.11 mg/dL (ref 0.61–1.24)
GFR, Estimated: >60 mL/min (ref >60)
Glucose, Bld: 101 mg/dL — ABNORMAL HIGH (ref 70–99)
Potassium: 4.8 mmol/L (ref 3.5–5.1)
Sodium: 134 mmol/L — ABNORMAL LOW (ref 135–145)
Total Bilirubin: 0.4 mg/dL (ref 0.3–1.2)
Total Protein: 7 g/dL (ref 6.5–8.1)

## 2022-04-27 LAB — TSH: TSH: 0.867 u[IU]/mL (ref 0.350–4.500)

## 2022-04-27 LAB — CBC WITH DIFFERENTIAL/PLATELET
Abs Immature Granulocytes: 0.28 10*3/uL — ABNORMAL HIGH (ref 0.00–0.07)
Basophils Absolute: 0 10*3/uL (ref 0.0–0.1)
Basophils Relative: 0 %
Eosinophils Absolute: 0.1 10*3/uL (ref 0.0–0.5)
Eosinophils Relative: 1 %
HCT: 49.2 % (ref 39.0–52.0)
Hemoglobin: 16 g/dL (ref 13.0–17.0)
Immature Granulocytes: 2 %
Lymphocytes Relative: 7 %
Lymphs Abs: 1.2 10*3/uL (ref 0.7–4.0)
MCH: 32 pg (ref 26.0–34.0)
MCHC: 32.5 g/dL (ref 30.0–36.0)
MCV: 98.4 fL (ref 80.0–100.0)
Monocytes Absolute: 1.1 10*3/uL — ABNORMAL HIGH (ref 0.1–1.0)
Monocytes Relative: 6 %
Neutro Abs: 14.6 10*3/uL — ABNORMAL HIGH (ref 1.7–7.7)
Neutrophils Relative %: 84 %
Platelets: 297 10*3/uL (ref 150–400)
RBC: 5 MIL/uL (ref 4.22–5.81)
RDW: 15.4 % (ref 11.5–15.5)
WBC: 17.3 10*3/uL — ABNORMAL HIGH (ref 4.0–10.5)
nRBC: 0 % (ref 0.0–0.2)

## 2022-04-27 LAB — BRAIN NATRIURETIC PEPTIDE: B Natriuretic Peptide: 46.1 pg/mL (ref 0.0–100.0)

## 2022-04-27 MED ORDER — FUROSEMIDE 20 MG PO TABS: 20.0000 mg | ORAL_TABLET | Freq: Every day | ORAL | 0 refills | Status: DC

## 2022-04-27 NOTE — ED Triage Notes (Signed)
Patient is here for "leg pain/swelling, mainly in feet". On chronic steroids by pulmonary. Needs water pill. No fever. SOB "is same, some better", followed by specialty closely.  ?

## 2022-04-27 NOTE — Telephone Encounter (Signed)
Attempted to call pt but unable to reach. Left message for him to return call. °

## 2022-04-27 NOTE — Discharge Instructions (Signed)
Your kidney function was appropriate so we are going to start Lasix 20 mg daily.  You should take this in the morning.  Avoid salt and make sure you are drinking plenty of fluid.  I would also contact your pulmonologist to determine if you can decrease your prednisone use.  We will contact you if any of your other lab work is abnormal.  If you develop any shortness of breath, chest pain, heart racing you need to go to the emergency room immediately.  Because we are giving a fluid pill you need to have your electrolytes and kidney function rechecked later this week; please follow-up with your primary care or our clinic. ?

## 2022-04-27 NOTE — ED Provider Notes (Signed)
?[facility] ? ? ? ?CSN: [id number] ?Arrival date & time: 04/27/22  1628 ? ? ?  ? ?History   ?Chief Complaint ?Chief Complaint  ?Patient presents with  ? Foot Swelling  ? ? ?HPI ?Irineo AxonStephen L Ewton is a 51 y.o. male.  ? ?Patient presents today with several month history of persistent pitting edema that has worsened over the past several days prompting evaluation.  Patient attributes this to chronic prednisone use as he is prescribed between 40 and 60 mg daily for the past several months for uncontrolled asthma and COPD.  He reports swelling began soon after initiation of this medication has been persistent since that time.  He denies history of thyroid condition, heart failure, liver/kidney disease.  He is not prescribed diuretic.  He believes that he has bronchitis and then he needs an antibiotic as he continues to have severe cough.  He has been taking all medications as prescribed by pulmonologist with minimal improvement of symptoms.  He denies any increased sodium consumption.  He denies history of DVT or any significant VTE risk factors including malignancy, immobilization, recent hospitalization, recent COVID-19. ? ? ?Past Medical History:  ?Diagnosis Date  ? Asthma   ? Chronic idiopathic constipation   ? Chronic obstruct airways disease (HCC)   ? Chronic pancreatitis (HCC)   ? Degenerative disorder of muscle   ? Hearing loss   ? Hemorrhoids   ? Hyperlipidemia   ? Insomnia   ? Male erectile dysfunction   ? Nausea   ? Pancreas (digestive gland) works poorly   ? Seasonal allergic rhinitis   ? Vitamin D deficiency   ? ? ?Patient Active Problem List  ? Diagnosis Date Noted  ? Tremor 01/26/2019  ? Numbness and tingling of both legs 01/26/2019  ? ? ?Past Surgical History:  ?Procedure Laterality Date  ? PANCREAS SURGERY    ? ? ? ? ? ?Home Medications   ? ?Prior to Admission medications   ?Medication Sig Start Date End Date Taking? Authorizing Provider  ?BREZTRI AEROSPHERE 160-9-4.8 MCG/ACT AERO Inhale into the  lungs. 04/24/22  Yes [provider]  ?clonazePAM (KLONOPIN) 0.5 MG tablet Take 0.5 mg by mouth 2 (two) times daily as needed. 01/14/22  Yes [provider]  ?fluticasone (FLONASE) 50 MCG/ACT nasal spray Place 1 spray into both nostrils daily.   Yes [provider]  ?fluticasone-salmeterol (ADVAIR HFA) 230-21 MCG/ACT inhaler Inhale 2 puffs into the lungs 2 (two) times daily. 02/09/22  Yes Hunsucker, Lesia SagoMatthew R, MD  ?furosemide (LASIX) 20 MG tablet Take 1 tablet (20 mg total) by mouth daily. 04/27/22  Yes Nakari Bracknell K, PA-C  ?ipratropium (ATROVENT) 0.06 % nasal spray Place 2 sprays into both nostrils 4 (four) times daily. 03/12/22  Yes Becky Augustayan, Jeremy, NP  ?Karlene EinsteinLINZESS 145 MCG CAPS capsule Take 145 mcg by mouth daily as needed. 04/03/22  Yes [provider]  ?oxyCODONE (OXYCONTIN) 20 mg 12 hr tablet Take 30 mg by mouth every 12 (twelve) hours.    Yes [provider]  ?PANCREAZE 6295216800 units CPEP TAKE ONE CAPSULE BY MOUTH 3 TIMES A DAY 02/01/18  Yes [provider]  ?pantoprazole (PROTONIX) 40 MG tablet Take 40 mg by mouth daily.   Yes [provider]  ?predniSONE (DELTASONE) 20 MG tablet Take 2 tablets (40 mg total) by mouth daily with breakfast for 14 days, THEN 1.5 tablets (30 mg total) daily with breakfast for 14 days, THEN 1 tablet (20 mg total) daily with breakfast. 04/22/22  07/19/22 Yes Hunsucker, Lesia Sago, MD  ?pregabalin (LYRICA) 50 MG capsule Take 50 mg by mouth at bedtime. 04/13/22  Yes [provider]  ?VENTOLIN HFA 108 (90 Base) MCG/ACT inhaler 2 puffs every 4 (four) hours as needed. Last taken: 1015am. Today. 02/25/18  Yes [provider]  ?ibuprofen (ADVIL,MOTRIN) 600 MG tablet Take 1 tablet (600 mg total) by mouth every 6 (six) hours as needed. 04/17/18   Domenick Gong, MD  ?nystatin (MYCOSTATIN) 100000 UNIT/ML suspension Take 5 mLs (500,000 Units total) by mouth 4 (four) times daily. 04/18/22   Cuthriell, Delorise Royals, PA-C  ?ondansetron  (ZOFRAN) 4 MG tablet Take 4 mg by mouth every 6 (six) hours as needed. 04/03/22   [provider]  ?promethazine-dextromethorphan (PROMETHAZINE-DM) 6.25-15 MG/5ML syrup Take 5 mLs by mouth 4 (four) times daily as needed. 04/11/22   Shirlee Latch, PA-C  ? ? ?Family History ?Family History  ?Problem Relation Age of Onset  ? Lung cancer Mother   ? Pancreatic disease Father   ? ? ?Social History ?Social History  ? ?Tobacco Use  ? Smoking status: Former  ?  Types: Cigarettes  ? Smokeless tobacco: Never  ?Vaping Use  ? Vaping Use: Former  ?Substance Use Topics  ? Alcohol use: No  ?  Alcohol/week: 0.0 standard drinks  ? Drug use: No  ? ? ? ?Allergies   ?Gabapentin, Zithromax [azithromycin], Levaquin [levofloxacin], Penicillin g, and Penicillins ? ? ?Review of Systems ?Review of Systems  ?Constitutional:  Positive for activity change. Negative for appetite change, fatigue and fever.  ?HENT:  Negative for congestion, sinus pressure, sneezing and sore throat.   ?Respiratory:  Positive for cough and shortness of breath.   ?Cardiovascular:  Positive for leg swelling. Negative for chest pain and palpitations.  ?Gastrointestinal:  Negative for abdominal pain, diarrhea, nausea and vomiting.  ? ? ?Physical Exam ?Triage Vital Signs ?ED Triage Vitals  ?Enc Vitals Group  ?   BP 04/27/22 1637 134/87  ?   Pulse Rate 04/27/22 1637 (!) 119  ?   Resp 04/27/22 1637 (!) 22  ?   Temp 04/27/22 1637 98.6 ?F (37 ?C)  ?   Temp Source 04/27/22 1637 Oral  ?   SpO2 04/27/22 1637 95 %  ?   Weight 04/27/22 1634 227 lb (103 kg)  ?   Height 04/27/22 1634 6' (1.829 m)  ?   Head Circumference --   ?   Peak Flow --   ?   Pain Score 04/27/22 1632 0  ?   Pain Loc --   ?   Pain Edu? --   ?   Excl. in GC? --   ? ?No data found. ? ?Updated Vital Signs ?BP 134/87 (BP Location: Left Arm)   Pulse (!) 119   Temp 98.6 ?F (37 ?C) (Oral)   Resp (!) 22   Ht 6' (1.829 m)   Wt 227 lb (103 kg)   SpO2 95%   BMI 30.79 kg/m?  ? ?Visual Acuity ?Right Eye  Distance:   ?Left Eye Distance:   ?Bilateral Distance:   ? ?Right Eye Near:   ?Left Eye Near:    ?Bilateral Near:    ? ?Physical Exam ?Vitals reviewed.  ?Constitutional:   ?   General: He is awake.  ?   Appearance: Normal appearance. He is well-developed. He is not ill-appearing.  ?   Comments: Very pleasant male appears stated age in no acute distress sitting comfortably in exam room  ?  HENT:  ?   Head: Normocephalic and atraumatic.  ?   Mouth/Throat:  ?   Mouth: Mucous membranes are moist.  ?   Pharynx: Uvula midline. No oropharyngeal exudate, posterior oropharyngeal erythema or uvula swelling.  ?Cardiovascular:  ?   Rate and Rhythm: Normal rate and regular rhythm.  ?   Heart sounds: Normal heart sounds, S1 normal and S2 normal. No murmur heard. ?   Comments: 3+ pitting edema to knee bilaterally ?Pulmonary:  ?   Effort: Pulmonary effort is normal.  ?   Breath sounds: No stridor. Wheezing present. No rhonchi or rales.  ?   Comments: Scattered wheezing ?Abdominal:  ?   General: Bowel sounds are normal.  ?   Palpations: Abdomen is soft.  ?   Tenderness: There is no abdominal tenderness.  ?Musculoskeletal:  ?   Right lower leg: 3+ Edema present.  ?   Left lower leg: 3+ Edema present.  ?Neurological:  ?   Mental Status: He is alert.  ?Psychiatric:     ?   Behavior: Behavior is cooperative.  ? ? ? ?UC Treatments / Results  ?Labs ?(all labs ordered are listed, but only abnormal results are displayed) ?Labs Reviewed  ?CBC WITH DIFFERENTIAL/PLATELET - Abnormal; Notable for the following components:  ?    Result Value  ? WBC 17.3 (*)   ? Neutro Abs 14.6 (*)   ? Monocytes Absolute 1.1 (*)   ? Abs Immature Granulocytes 0.28 (*)   ? All other components within normal limits  ?COMPREHENSIVE METABOLIC PANEL - Abnormal; Notable for the following components:  ? Sodium 134 (*)   ? Chloride 91 (*)   ? CO2 36 (*)   ? Glucose, Bld 101 (*)   ? BUN 22 (*)   ? All other components within normal limits  ?URINALYSIS, ROUTINE W REFLEX  MICROSCOPIC  ?TSH  ?BRAIN NATRIURETIC PEPTIDE  ? ? ?EKG ? ? ?Radiology ?No results found. ? ?Procedures ?Procedures (including critical care time) ? ?Medications Ordered in UC ?Medications - No data to display ? ?Initial I

## 2022-04-28 NOTE — Telephone Encounter (Signed)
I called and spoke with the pt  ?He went to ED yesterday and was given lasix for swelling  ?He says that this has not helped so far and that ED rec that he needed to f/u with [place] if this was the case  ?I have scheduled him for video visit with AO in the am  ?Advised seek emergent care sooner if needed ?

## 2022-04-29 ENCOUNTER — Encounter: Payer: Self-pay | Admitting: Pulmonary Disease

## 2022-04-29 ENCOUNTER — Telehealth (INDEPENDENT_AMBULATORY_CARE_PROVIDER_SITE_OTHER): Payer: Medicare Other | Admitting: Pulmonary Disease

## 2022-04-29 DIAGNOSIS — R0602 Shortness of breath: Secondary | ICD-10-CM | POA: Diagnosis not present

## 2022-04-29 DIAGNOSIS — J4550 Severe persistent asthma, uncomplicated: Secondary | ICD-10-CM

## 2022-04-29 NOTE — Patient Instructions (Addendum)
Nice chatting with you today ? ?Stay on is a low-dose of prednisone as tolerated ? ?Try and stay on the 20 mg that you are using now, if you need to you can add 10 mg a day and then try and wean down as tolerated ? ?Complete your course of Lasix ? ?Make sure you follow-up with cardiology as scheduled ?-You do not have underlying  heart disease as far as we know ? ?Graded exercises as tolerated ? ?Keep appointment with Dr. Judeth Horn as scheduled ? ?We will follow-up on the process for tezspire ? ?Continue using Advair, stop using Breztri-this is like double dipping ? ? ?At some point, we need to get the breathing study that gives [place] more information about your lungs ?

## 2022-04-29 NOTE — Progress Notes (Deleted)
? ?      Eugene Blackburn    [id number]    August 31, 1971 ? ?Primary Care Physician:Lindley, Fernand Parkins, FNP ? ?Referring Physician: Armando Gang, FNP ?[phone] ?[place],  [place] [address] ? ?Chief complaint:  *** ? ?HPI: ? ?*** ? ?Pets: ?Occupation: ?Exposures: ?Smoking history: ?Travel history: ?Relevant family history: ? ?Outpatient Encounter Medications as of 04/29/2022  ?Medication Sig  ? BREZTRI AEROSPHERE 160-9-4.8 MCG/ACT AERO Inhale into the lungs.  ? clonazePAM (KLONOPIN) 0.5 MG tablet Take 0.5 mg by mouth 2 (two) times daily as needed.  ? fluticasone (FLONASE) 50 MCG/ACT nasal spray Place 1 spray into both nostrils daily.  ? fluticasone-salmeterol (ADVAIR HFA) 230-21 MCG/ACT inhaler Inhale 2 puffs into the lungs 2 (two) times daily.  ? furosemide (LASIX) 20 MG tablet Take 1 tablet (20 mg total) by mouth daily.  ? ibuprofen (ADVIL,MOTRIN) 600 MG tablet Take 1 tablet (600 mg total) by mouth every 6 (six) hours as needed.  ? ipratropium (ATROVENT) 0.06 % nasal spray Place 2 sprays into both nostrils 4 (four) times daily.  ? LINZESS 145 MCG CAPS capsule Take 145 mcg by mouth daily as needed.  ? nystatin (MYCOSTATIN) 100000 UNIT/ML suspension Take 5 mLs (500,000 Units total) by mouth 4 (four) times daily.  ? ondansetron (ZOFRAN) 4 MG tablet Take 4 mg by mouth every 6 (six) hours as needed.  ? oxyCODONE (OXYCONTIN) 20 mg 12 hr tablet Take 30 mg by mouth every 12 (twelve) hours.   ? PANCREAZE 97353 units CPEP TAKE ONE CAPSULE BY MOUTH 3 TIMES A DAY  ? pantoprazole (PROTONIX) 40 MG tablet Take 40 mg by mouth daily.  ? predniSONE (DELTASONE) 20 MG tablet Take 2 tablets (40 mg total) by mouth daily with breakfast for 14 days, THEN 1.5 tablets (30 mg total) daily with breakfast for 14 days, THEN 1 tablet (20 mg total) daily with breakfast.  ? pregabalin (LYRICA) 50 MG capsule Take 50 mg by mouth at bedtime.  ? promethazine-dextromethorphan (PROMETHAZINE-DM) 6.25-15 MG/5ML syrup Take 5 mLs by mouth 4 (four)  times daily as needed.  ? VENTOLIN HFA 108 (90 Base) MCG/ACT inhaler 2 puffs every 4 (four) hours as needed. Last taken: 1015am. Today.  ? ?No facility-administered encounter medications on file as of 04/29/2022.  ? ? ?Allergies as of 04/29/2022 - Review Complete 04/27/2022  ?Allergen Reaction Noted  ? Gabapentin Shortness Of Breath 03/09/2020  ? Zithromax [azithromycin] Rash 08/31/2015  ? Levaquin [levofloxacin] Other (See Comments) 01/19/2020  ? Penicillin g Hives 01/26/2019  ? Penicillins Itching and Rash 08/31/2015  ? ? ?Past Medical History:  ?Diagnosis Date  ? Asthma   ? Chronic idiopathic constipation   ? Chronic obstruct airways disease (HCC)   ? Chronic pancreatitis (HCC)   ? Degenerative disorder of muscle   ? Hearing loss   ? Hemorrhoids   ? Hyperlipidemia   ? Insomnia   ? Male erectile dysfunction   ? Nausea   ? Pancreas (digestive gland) works poorly   ? Seasonal allergic rhinitis   ? Vitamin D deficiency   ? ? ?Past Surgical History:  ?Procedure Laterality Date  ? PANCREAS SURGERY    ? ? ?Family History  ?Problem Relation Age of Onset  ? Lung cancer Mother   ? Pancreatic disease Father   ? ? ?Social History  ? ?Socioeconomic History  ? Marital status: Single  ?  Spouse name: Not on file  ? Number of children: Not on file  ? Years  of education: Not on file  ? Highest education level: Not on file  ?Occupational History  ? Not on file  ?Tobacco Use  ? Smoking status: Former  ?  Types: Cigarettes  ? Smokeless tobacco: Never  ?Vaping Use  ? Vaping Use: Former  ?Substance and Sexual Activity  ? Alcohol use: No  ?  Alcohol/week: 0.0 standard drinks  ? Drug use: No  ? Sexual activity: Not Currently  ?Other Topics Concern  ? Not on file  ?Social History Narrative  ? Not on file  ? ?Social Determinants of Health  ? ?Financial Resource Strain: Not on file  ?Food Insecurity: Not on file  ?Transportation Needs: Not on file  ?Physical Activity: Not on file  ?Stress: Not on file  ?Social Connections: Not on file   ?Intimate Partner Violence: Not on file  ? ? ?Review of Systems ? ?There were no vitals filed for this visit. ? ? ?Physical Exam ? ? ?Data Reviewed: ?*** ? ?Assessment:  ?*** ? ?Plan/Recommendations: ?*** ? ? ?Virl Diamond MD ?[facility] ?04/29/2022, 9:31 AM ? ?CC: Armando Gang, FNP ? ? ?

## 2022-04-29 NOTE — Progress Notes (Signed)
Virtual Visit via Video Note ? ?I connected with Eugene Blackburn on 04/29/22 at  9:15 AM EDT by a video enabled telemedicine application and verified that I am speaking with the correct person using two identifiers. ? ?Location: ?Patient: Patient was at home ?Provider: Office, [place].. ?  ?I discussed the limitations of evaluation and management by telemedicine and the availability of in person appointments. The patient expressed understanding and agreed to proceed. ? ?History of Present Illness: ?Patient with a history of asthma, follows up with Dr. Silas Flood ?Worsening of symptoms since COVID infection ?Was on high doses of steroids ?Was seen in the emergency department on 04/27/2022 for leg swelling, facial swelling ?-Denies underlying history of heart disease ? ?  ?Observations/Objective: ?Looks well on the phone ?Denies shortness of breath at rest but limited activity levels ? ?Recent blood work in the ED noted-within normal limits ?BNP of 46.1 WBC count of 17.3 ?-Likely related to being on steroids ? ?Chest x-ray 04/20/2022 with no acute infiltrate, elevated left hemidiaphragm which is chronic ? ?Assessment and Plan: ?Shortness of breath ? ?Asthma ? ?Post-COVID deconditioning/shortness of breath ? ?Continue with lower dose of prednisone and try and wean down as tolerated ?-Discontinue Breztri and stay on Advair ? ?Set up for tezspire ongoing ? ?Left elevated hemidiaphragm-chronic ? ?Encouraged to keep appointment with cardiology-will likely need an echocardiogram ? ?Follow Up Instructions: ?Follow-up with Dr. Silas Flood as previously scheduled ?  ?I discussed the assessment and treatment plan with the patient. The patient was provided an opportunity to ask questions and all were answered. The patient agreed with the plan and demonstrated an understanding of the instructions. ?  ?The patient was advised to call back or seek an in-person evaluation if the symptoms worsen or if the condition fails to  improve as anticipated. ? ?I provided 30 minutes of non-face-to-face time during this encounter. ? ? ?Laurin Coder, MD ? ? ?

## 2022-05-04 ENCOUNTER — Encounter (INDEPENDENT_AMBULATORY_CARE_PROVIDER_SITE_OTHER): Payer: Medicare Other | Admitting: Pulmonary Disease

## 2022-05-04 DIAGNOSIS — Z5181 Encounter for therapeutic drug level monitoring: Secondary | ICD-10-CM

## 2022-05-04 DIAGNOSIS — Z79899 Other long term (current) drug therapy: Secondary | ICD-10-CM

## 2022-05-05 ENCOUNTER — Other Ambulatory Visit
Admission: RE | Admit: 2022-05-05 | Discharge: 2022-05-05 | Disposition: A | Discharge status: Home / Self Care | Payer: Medicare Other | Attending: Pulmonary Disease | Admitting: Pulmonary Disease

## 2022-05-05 DIAGNOSIS — Z5181 Encounter for therapeutic drug level monitoring: Secondary | ICD-10-CM | POA: Insufficient documentation

## 2022-05-05 DIAGNOSIS — Z79899 Other long term (current) drug therapy: Secondary | ICD-10-CM | POA: Insufficient documentation

## 2022-05-05 LAB — BASIC METABOLIC PANEL
Anion gap: 7 (ref 5–15)
BUN: 11 mg/dL (ref 6–20)
CO2: 32 mmol/L (ref 22–32)
Calcium: 9.2 mg/dL (ref 8.9–10.3)
Chloride: 100 mmol/L (ref 98–111)
Creatinine, Ser: 0.93 mg/dL (ref 0.61–1.24)
GFR, Estimated: >60 mL/min (ref >60)
Glucose, Bld: 97 mg/dL (ref 70–99)
Potassium: 3.8 mmol/L (ref 3.5–5.1)
Sodium: 139 mmol/L (ref 135–145)

## 2022-05-05 MED ORDER — FUROSEMIDE 20 MG PO TABS: 20.0000 mg | ORAL_TABLET | Freq: Every day | ORAL | 0 refills | Status: DC

## 2022-05-05 NOTE — Telephone Encounter (Signed)
Patient last seen by me/6/23.  Recent ED visit with lower extremity swelling.  Prescribed Lasix.  He says this is helped some but still with residual swelling and does feel like it helped his shortness of breath a bit.  He is requesting ongoing Lasix therapy.  Requested him get labs to evaluate kidney function, electrolytes.  Lab obtained today.  Reviewed.  Look okay.  Patient contacted with results via [place].  New prescription Lasix 20 mg daily quantity 30, 0 refills sent to local pharmacy. ? ?I spent 15 minutes in care of the patient during the encounter including review of recent ED visit, review of recent labs, ordering labs, reviewing those new labs, prescription sent to his local pharmacy. ?

## 2022-05-05 NOTE — Progress Notes (Signed)
Labs are ok, sending 1 month supply of lasix.

## 2022-06-22 ENCOUNTER — Ambulatory Visit: Payer: Medicare Other | Admitting: Pulmonary Disease

## 2022-07-09 ENCOUNTER — Ambulatory Visit: Payer: Medicare Other | Admitting: Pulmonary Disease

## 2022-08-16 ENCOUNTER — Ambulatory Visit
Admission: EM | Admit: 2022-08-16 | Discharge: 2022-08-16 | Disposition: A | Discharge status: Home / Self Care | Payer: Medicare Other | Attending: Physician Assistant | Admitting: Physician Assistant

## 2022-08-16 DIAGNOSIS — R051 Acute cough: Secondary | ICD-10-CM

## 2022-08-16 DIAGNOSIS — Z8709 Personal history of other diseases of the respiratory system: Secondary | ICD-10-CM | POA: Diagnosis not present

## 2022-08-16 DIAGNOSIS — J019 Acute sinusitis, unspecified: Secondary | ICD-10-CM | POA: Diagnosis not present

## 2022-08-16 MED ORDER — PREDNISONE 20 MG PO TABS: 40.0000 mg | ORAL_TABLET | Freq: Every day | ORAL | 0 refills | Status: AC

## 2022-08-16 MED ORDER — PROMETHAZINE-DM 6.25-15 MG/5ML PO SYRP
5.0000 mL | ORAL_SOLUTION | Freq: Four times a day (QID) | ORAL | 0 refills | Status: DC | PRN
Start: 2022-08-16 — End: 2022-10-09

## 2022-08-16 MED ORDER — DOXYCYCLINE HYCLATE 100 MG PO CAPS: 100.0000 mg | ORAL_CAPSULE | Freq: Two times a day (BID) | ORAL | 0 refills | Status: AC

## 2022-08-16 NOTE — ED Provider Notes (Signed)
[facility]    CSN: [id number] Arrival date & time: 08/16/22  1224      History   Chief Complaint Chief Complaint  Patient presents with   Cough   Headache    HPI SENDER RUEB is a 51 y.o. male with history of COPD and allergies.  Patient presents today for 3 to 4-day history of nasal congestion, sinus pressure, bilateral ear pain/pressure and productive cough.  Patient also reports a lot of postnasal drainage and throat irritation.  Denies fever.  Denies any wheezing or breathing difficulty.  Patient reports he has tried over-the-counter Sudafed and allergy medicine as well as Flonase without any improvement in symptoms.  He reports that he has a history of long-haul COVID and says he was getting frequent exacerbations of COPD up until about 4 months ago and he has not really been improving since then.  Reports he has not had any antibiotics treatment in the past 4 months.  Denies COVID exposure and declines testing today.  HPI  Past Medical History:  Diagnosis Date   Asthma    Chronic idiopathic constipation    Chronic obstruct airways disease (HCC)    Chronic pancreatitis (HCC)    Degenerative disorder of muscle    Hearing loss    Hemorrhoids    Hyperlipidemia    Insomnia    Male erectile dysfunction    Nausea    Pancreas (digestive gland) works poorly    Seasonal allergic rhinitis    Vitamin D deficiency     Patient Active Problem List   Diagnosis Date Noted   Tremor 01/26/2019   Numbness and tingling of both legs 01/26/2019    Past Surgical History:  Procedure Laterality Date   PANCREAS SURGERY         Home Medications    Prior to Admission medications   Medication Sig Start Date End Date Taking? Authorizing Provider  BREZTRI AEROSPHERE 160-9-4.8 MCG/ACT AERO Inhale into the lungs. 04/24/22  Yes [provider]  clonazePAM (KLONOPIN) 0.5 MG tablet Take 0.5 mg by mouth 2 (two) times daily as needed. 01/14/22  Yes [provider]  doxycycline (VIBRAMYCIN) 100 MG capsule Take 1 capsule (100 mg total) by mouth 2 (two) times daily for 7 days. 08/16/22 08/23/22 Yes Eusebio Friendly B, PA-C  fluticasone (FLONASE) 50 MCG/ACT nasal spray Place 1 spray into both nostrils daily.   Yes [provider]  fluticasone-salmeterol (ADVAIR HFA) 230-21 MCG/ACT inhaler Inhale 2 puffs into the lungs 2 (two) times daily. 02/09/22  Yes Hunsucker, Lesia Sago, MD  furosemide (LASIX) 20 MG tablet Take 1 tablet (20 mg total) by mouth daily. 05/05/22  Yes Hunsucker, Lesia Sago, MD  ipratropium (ATROVENT) 0.06 % nasal spray Place 2 sprays into both nostrils 4 (four) times daily. 03/12/22  Yes Becky Augusta, NP  LINZESS 145 MCG CAPS capsule Take 145 mcg by mouth daily as needed. 04/03/22  Yes [provider]  nystatin (MYCOSTATIN) 100000 UNIT/ML suspension Take 5 mLs (500,000 Units total) by mouth 4 (four) times daily. 04/18/22  Yes Cuthriell, Delorise Royals, PA-C  ondansetron (ZOFRAN) 4 MG tablet Take 4 mg by mouth every 6 (six) hours as needed. 04/03/22  Yes [provider]  PANCREAZE 37902 units CPEP TAKE ONE CAPSULE BY MOUTH 3 TIMES A DAY 02/01/18  Yes [provider]  pantoprazole (PROTONIX) 40 MG tablet Take 40 mg by mouth daily.   Yes [provider]  predniSONE (DELTASONE) 20 MG tablet Take 2 tablets (  40 mg total) by mouth daily for 5 days. 08/16/22 08/21/22 Yes Eusebio Friendly B, PA-C  pregabalin (LYRICA) 50 MG capsule Take 50 mg by mouth at bedtime. 04/13/22  Yes [provider]  VENTOLIN HFA 108 (90 Base) MCG/ACT inhaler 2 puffs every 4 (four) hours as needed. Last taken: 1015am. Today. 02/25/18  Yes [provider]  ibuprofen (ADVIL,MOTRIN) 600 MG tablet Take 1 tablet (600 mg total) by mouth every 6 (six) hours as needed. 04/17/18   Domenick Gong, MD  oxyCODONE (OXYCONTIN) 20 mg 12 hr tablet Take 30 mg by mouth every 12 (twelve) hours.     [provider]   promethazine-dextromethorphan (PROMETHAZINE-DM) 6.25-15 MG/5ML syrup Take 5 mLs by mouth 4 (four) times daily as needed. 08/16/22   Shirlee Latch, PA-C    Family History Family History  Problem Relation Age of Onset   Lung cancer Mother    Pancreatic disease Father     Social History Social History   Tobacco Use   Smoking status: Former    Types: Cigarettes   Smokeless tobacco: Never  Vaping Use   Vaping Use: Former  Substance Use Topics   Alcohol use: No    Alcohol/week: 0.0 standard drinks of alcohol   Drug use: No     Allergies   Gabapentin, Zithromax [azithromycin], Levaquin [levofloxacin], Penicillin g, and Penicillins   Review of Systems Review of Systems  Constitutional:  Negative for fatigue and fever.  HENT:  Positive for congestion, ear pain, postnasal drip, rhinorrhea and sinus pressure. Negative for sore throat.   Respiratory:  Positive for cough. Negative for shortness of breath.   Cardiovascular:  Negative for chest pain.  Gastrointestinal:  Negative for abdominal pain, diarrhea, nausea and vomiting.  Musculoskeletal:  Negative for myalgias.  Neurological:  Negative for weakness, light-headedness and headaches.  Hematological:  Negative for adenopathy.     Physical Exam Triage Vital Signs ED Triage Vitals  Enc Vitals Group     BP      Pulse      Resp      Temp      Temp src      SpO2      Weight      Height      Head Circumference      Peak Flow      Pain Score      Pain Loc      Pain Edu?      Excl. in GC?    No data found.  Updated Vital Signs BP 122/79 (BP Location: Left Arm)   Pulse (!) 111   Temp 99 F (37.2 C) (Oral)   Resp 18   Ht 6' (1.829 m)   Wt 224 lb (101.6 kg)   SpO2 95%   BMI 30.38 kg/m     Physical Exam Vitals and nursing note reviewed.  Constitutional:      General: He is not in acute distress.    Appearance: Normal appearance. He is well-developed. He is ill-appearing.  HENT:     Head: Normocephalic  and atraumatic.     Right Ear: Ear canal and external ear normal. A middle ear effusion is present.     Left Ear: Ear canal and external ear normal. A middle ear effusion is present.     Nose: Congestion present.     Mouth/Throat:     Mouth: Mucous membranes are moist.     Pharynx: Oropharynx is clear. Posterior oropharyngeal erythema present.  Eyes:     General: No scleral icterus.    Conjunctiva/sclera: Conjunctivae normal.  Cardiovascular:     Rate and Rhythm: Regular rhythm. Tachycardia present.     Heart sounds: Normal heart sounds.  Pulmonary:     Effort: Pulmonary effort is normal. No respiratory distress.     Breath sounds: Normal breath sounds.  Musculoskeletal:     Cervical back: Neck supple.  Skin:    General: Skin is warm and dry.     Capillary Refill: Capillary refill takes less than 2 seconds.  Neurological:     General: No focal deficit present.     Mental Status: He is alert. Mental status is at baseline.     Motor: No weakness.     Gait: Gait normal.  Psychiatric:        Mood and Affect: Mood normal.        Behavior: Behavior normal.      UC Treatments / Results  Labs (all labs ordered are listed, but only abnormal results are displayed) Labs Reviewed - No data to display  EKG   Radiology No results found.  Procedures Procedures (including critical care time)  Medications Ordered in UC Medications - No data to display  Initial Impression / Assessment and Plan / UC Course  I have reviewed the triage vital signs and the nursing notes.  Pertinent labs & imaging results that were available during my care of the patient were reviewed by me and considered in my medical decision making (see chart for details).   51 year old male with history of COPD and allergies presents for 3 to 4-day history of nasal congestion, sinus pressure, ear pain/pressure, postnasal drainage and productive cough.  Denies fever or breathing difficulty.  Patient is in no  acute distress.  He is ill-appearing but nontoxic.  On exam he does have significant nasal congestion and erythema of posterior pharynx with mild swelling of posterior pharynx.  Clear effusion of bilateral TMs.  Chest clear auscultation and heart regular rhythm.  Suspect sinusitis, most likely viral versus allergy but patient does have frequent COPD exacerbations so we will cover him with antibiotics at this time.  Sent doxycycline and prednisone as well as Promethazine DM.  Supportive care.  Follow-up as needed.   Final Clinical Impressions(s) / UC Diagnoses   Final diagnoses:  Acute sinusitis, recurrence not specified, unspecified location  Acute cough  History of COPD   Discharge Instructions   None    ED Prescriptions     Medication Sig Dispense Auth. Provider   promethazine-dextromethorphan (PROMETHAZINE-DM) 6.25-15 MG/5ML syrup Take 5 mLs by mouth 4 (four) times daily as needed. 118 mL Eusebio Friendly B, PA-C   doxycycline (VIBRAMYCIN) 100 MG capsule Take 1 capsule (100 mg total) by mouth 2 (two) times daily for 7 days. 14 capsule Eusebio Friendly B, PA-C   predniSONE (DELTASONE) 20 MG tablet Take 2 tablets (40 mg total) by mouth daily for 5 days. 10 tablet Gareth Morgan      PDMP not reviewed this encounter.   Shirlee Latch, PA-C 08/16/22 1326

## 2022-08-16 NOTE — ED Triage Notes (Signed)
Pt c/o headache, nasal congestion x3days  Pt states that he feels like his head is going to blow off.  Pt states he feels his nose drain in the morning and go down his throat and it is causing him to cough and lose his voice.   Pt is not worried about covid or flu.

## 2022-10-01 ENCOUNTER — Encounter: Payer: Self-pay | Admitting: Pulmonary Disease

## 2022-10-01 NOTE — Telephone Encounter (Signed)
Dr. Silas Flood, please advise on pt's email regarding his worsening ShOB recently. Thanks.

## 2022-10-02 MED ORDER — PREDNISONE 20 MG PO TABS: 20.0000 mg | ORAL_TABLET | Freq: Every day | ORAL | 1 refills | Status: DC

## 2022-10-09 ENCOUNTER — Encounter: Payer: Self-pay | Admitting: Pulmonary Disease

## 2022-10-09 ENCOUNTER — Ambulatory Visit (INDEPENDENT_AMBULATORY_CARE_PROVIDER_SITE_OTHER): Payer: Medicare Other | Admitting: Pulmonary Disease

## 2022-10-09 ENCOUNTER — Telehealth: Payer: Self-pay

## 2022-10-09 ENCOUNTER — Other Ambulatory Visit (HOSPITAL_COMMUNITY): Payer: Self-pay

## 2022-10-09 VITALS — BP 130/68 | HR 117 | Wt 235.6 lb

## 2022-10-09 DIAGNOSIS — J4550 Severe persistent asthma, uncomplicated: Secondary | ICD-10-CM

## 2022-10-09 DIAGNOSIS — R0609 Other forms of dyspnea: Secondary | ICD-10-CM

## 2022-10-09 MED ORDER — DOXYCYCLINE HYCLATE 100 MG PO TABS: 100.0000 mg | ORAL_TABLET | Freq: Two times a day (BID) | ORAL | 0 refills | Status: DC

## 2022-10-09 MED ORDER — PREDNISONE 20 MG PO TABS: 40.0000 mg | ORAL_TABLET | Freq: Every day | ORAL | 0 refills | Status: AC

## 2022-10-09 MED ORDER — PROMETHAZINE-DM 6.25-15 MG/5ML PO SYRP: 5.0000 mL | ORAL_SOLUTION | Freq: Every evening | ORAL | 0 refills | Status: DC | PRN

## 2022-10-09 NOTE — Telephone Encounter (Signed)
Patient Advocate Encounter   Received notification that prior authorization is required for Tezspire 210MG /1.91ML auto-injectors  Submitted: 10-09-2022 Key [id number]  Status is pending

## 2022-10-09 NOTE — Progress Notes (Signed)
@Patient  ID: Eugene Blackburn, male    DOB: [birth year], 51 y.o.   MRN: [id number]  Chief Complaint  Patient presents with   Follow-up    Pt wantd to be seen sooner for DOE/asthma. Mychart messages have been sent back and forth for the last few days. Pt is now on 40mg  of lasix due to swelling. Pt is now on 20mg  of prednisone. States that he is feeling a little better with the increased prednisone. Pt feels like he has a lot of mucus in upper chest and throat and cant cough it up.     Referring provider: Remi Haggard, FNP  HPI:   51 y.o. man whom we are seeing in follow up for evaluation of dyspnea on exertion.  Recent ED note 07/2022 reviewed.  Patient last seen 04/2022.  At that time was prednisone dependent.  Escalating inhalers.  For bronchitis, chronic bronchitis symptoms.  High suspicion for asthma.  He reports good adherence to this.  He was also put on Lasix for lower extremity swelling which could have been contributing to symptoms.  20 mg daily.  Subsequently he increased to 40 mg daily at the instruction of his PCP.  His breathing got much better.  To the point where he tapered off his prednisone which was to be held at 20 mg daily.  He was feeling so well that he eventually stopped his Lasix as well.  In mid August 2023 he developed what sounds like upper respiratory illness symptoms.  Since then cough is lingered.  As well as shortness of breath.  Symptoms a bit worse since September.  He resumed his Lasix.  Tried to take the supply of prednisone he had left.  He contacted me for further advice.  We resumed his prednisone 20 mg daily last week.  He had some interval improvement.  He felt a lot congestion in his chest.  But he cannot cough it up.  Like it is thick.  HPI at initial visit: Patient diagnosed with COVID several weeks ago.  Late December 2022.  Since then had worsening cough, shortness of breath.  Some symptoms of improved but dyspnea lingers.  Presented to ED multiple  times.  Has been on prednisone essentially continuously over the last several weeks.  Most doses above 20 mg.  Thinks it helped some but as he decreases symptoms seem to get worse.  Currently using 20 mg daily.  Inhaler can also be working as well.  He does like the Spiriva thinks it helps more than the Advair discus.  Seems bit worse with cough in the morning and at night.  He is stop smoking in the setting of all this.  Reviewed his cross-sectional imaging in the ED 02/08/2022 and on my review interpretation reveals scattered atelectasis, no clear infiltrate, no evidence of PE, mild emphysematous changes in the apices.  Most recent chest x-ray 02/05/2022 reviewed and interpreted as clear lungs.  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  Lab Results: Personally reviewed CBC    Component Value Date/Time   WBC 17.3 (H) 04/27/2022 1657   RBC 5.00 04/27/2022 1657   HGB 16.0 04/27/2022 1657   HGB 15.6 09/25/2013 1641   HCT 49.2 04/27/2022 1657   HCT 47.6 09/25/2013 1641   PLT 297 04/27/2022 1657   PLT 264 09/25/2013 1641   MCV 98.4 04/27/2022 1657   MCV 89 09/25/2013 1641   MCH 32.0 04/27/2022 1657   MCHC 32.5 04/27/2022 1657   RDW 15.4 04/27/2022 1657   RDW 13.9 09/25/2013 1641   LYMPHSABS 1.2 04/27/2022 1657   LYMPHSABS 2.6 09/25/2013 1641    MONOABS 1.1 (H) 04/27/2022 1657   MONOABS 0.6 09/25/2013 1641   EOSABS 0.1 04/27/2022 1657   EOSABS 0.2 09/25/2013 1641   BASOSABS 0.0 04/27/2022 1657   BASOSABS 0.1 09/25/2013 1641    BMET    Component Value Date/Time   NA 139 05/05/2022 1046   K 3.8 05/05/2022 1046   CL 100 05/05/2022 1046   CO2 32 05/05/2022 1046   GLUCOSE 97 05/05/2022 1046   BUN 11 05/05/2022 1046   CREATININE 0.93 05/05/2022 1046   CALCIUM 9.2 05/05/2022 1046   GFRNONAA >60 05/05/2022 1046   GFRAA >60 03/10/2020 1101    BNP    Component Value Date/Time   BNP 46.1 04/27/2022 1657    ProBNP No results found for: "PROBNP"  Specialty Problems   None   Allergies  Allergen Reactions   Gabapentin Shortness Of Breath   Zithromax [Azithromycin] Rash    Patient states azithromycin caused him to "bleed"   Levaquin [Levofloxacin] Other (See Comments)    Tendon problem   Penicillin G Hives    Immunization History  Administered Date(s) Administered   Moderna Sars-Covid-2 Vaccination 03/15/2020, 04/13/2020, 08/19/2020, 04/11/2021    Past Medical History:  Diagnosis Date   Asthma    Chronic idiopathic constipation    Chronic obstruct airways disease (HCC)    Chronic pancreatitis (HCC)    Degenerative disorder of muscle    Hearing loss    Hemorrhoids    Hyperlipidemia    Insomnia    Male erectile dysfunction    Nausea    Pancreas (digestive gland) works poorly    Seasonal allergic rhinitis    Vitamin D deficiency     Tobacco History: Social History   Tobacco Use  Smoking Status Former   Types: Cigarettes  Smokeless Tobacco Never   Counseling given: Not Answered   Continue to not smoke  Outpatient Encounter Medications as of 10/09/2022  Medication Sig   BREZTRI AEROSPHERE 160-9-4.8 MCG/ACT AERO Inhale into the lungs.   clonazePAM (KLONOPIN) 0.5 MG tablet Take 0.5 mg by mouth 2 (two) times daily as needed.   doxycycline (VIBRA-TABS) 100 MG tablet Take 1 tablet (100 mg total)  by mouth 2 (two) times daily.   fluticasone (FLONASE) 50 MCG/ACT nasal spray Place 1 spray into both nostrils daily.   fluticasone-salmeterol (ADVAIR HFA) 230-21 MCG/ACT inhaler Inhale 2 puffs into the lungs 2 (two) times daily.   furosemide (LASIX) 20 MG tablet Take 1 tablet (20 mg total)  by mouth daily.   ibuprofen (ADVIL,MOTRIN) 600 MG tablet Take 1 tablet (600 mg total) by mouth every 6 (six) hours as needed.   ipratropium (ATROVENT) 0.06 % nasal spray Place 2 sprays into both nostrils 4 (four) times daily.   LINZESS 145 MCG CAPS capsule Take 145 mcg by mouth daily as needed.   nystatin (MYCOSTATIN) 100000 UNIT/ML suspension Take 5 mLs (500,000 Units total) by mouth 4 (four) times daily.   ondansetron (ZOFRAN) 4 MG tablet Take 4 mg by mouth every 6 (six) hours as needed.   oxyCODONE (OXYCONTIN) 20 mg 12 hr tablet Take 30 mg by mouth every 12 (twelve) hours.    PANCREAZE 48546 units CPEP TAKE ONE CAPSULE BY MOUTH 3 TIMES A DAY   pantoprazole (PROTONIX) 40 MG tablet Take 40 mg by mouth daily.   predniSONE (DELTASONE) 20 MG tablet Take 1 tablet (20 mg total) by mouth daily with breakfast.   predniSONE (DELTASONE) 20 MG tablet Take 2 tablets (40 mg total) by mouth daily with breakfast for 14 days.   VENTOLIN HFA 108 (90 Base) MCG/ACT inhaler 2 puffs every 4 (four) hours as needed. Last taken: 1015am. Today.   [DISCONTINUED] promethazine-dextromethorphan (PROMETHAZINE-DM) 6.25-15 MG/5ML syrup Take 5 mLs by mouth 4 (four) times daily as needed.   promethazine-dextromethorphan (PROMETHAZINE-DM) 6.25-15 MG/5ML syrup Take 5 mLs by mouth at bedtime as needed.   [DISCONTINUED] pregabalin (LYRICA) 50 MG capsule Take 50 mg by mouth at bedtime.   No facility-administered encounter medications on file as of 10/09/2022.     Review of Systems  Review of Systems  N/a Physical Exam  BP 130/68 (BP Location: Left Arm, Patient Position: Sitting, Cuff Size: Normal)   Pulse (!) 117   Wt 235 lb 9.6 oz  (106.9 kg)   SpO2 94%   BMI 31.95 kg/m   Wt Readings from Last 5 Encounters:  10/09/22 235 lb 9.6 oz (106.9 kg)  08/16/22 224 lb (101.6 kg)  04/27/22 227 lb (103 kg)  04/22/22 226 lb (102.5 kg)  04/20/22 227 lb (103 kg)    BMI Readings from Last 5 Encounters:  10/09/22 31.95 kg/m  08/16/22 30.38 kg/m  04/27/22 30.79 kg/m  04/22/22 30.65 kg/m  04/20/22 30.79 kg/m     Physical Exam General: looks tired, no acute distress Eyes: EOMI, no icterus Neck: Supple, no JVP Pulmonary: mild end expiratory wheeze, normal work of breathing Cardiovascular: Regular rhythm, murmur MSK: No synovitis, joint effusion Neuro: Normal gait, no weakness Psych: Normal mood, full affect   Assessment & Plan:   Dyspnea on exertion: Likely in the setting of prolonged bronchitis due to COVID-pneumonia exacerbated by underlying emphysema and likely smoking-related lung disease.  Had improved now worse with recurrent exacerbations mildly responsive to prednisone.  Recurrent bronchitis/chronic bronchitis with worsened cough/asthma: Possible contribution from smoking-related disease.  He is now abstinent from tobacco.  Currently on [place].  To continue.  Continue albuterol as needed.  Prednisone 40 mg daily for 2 weeks then decrease to 10 mg daily.  To continue this dose until he sees me next.  In the past, his eosinophils not been elevated.  We will try Tezspire, paperwork submitted this week.  Could try additional IL-5 if fails given steroid dependence.  Return in about 6 weeks (around 11/20/2022).   Karren Burly, MD 10/09/2022  I spent 41 minutes in care of patient including review of records, face to face visit, coordination of care

## 2022-10-09 NOTE — Telephone Encounter (Signed)
Received new start paperwork for Tezspire 210mg pen. We will begin BIV process. 

## 2022-10-09 NOTE — Patient Instructions (Addendum)
Nice to see you again  I am sorry your symptoms have  worsened  Take prednisone 40 mg daily for 2 weeks.  I sent a new prescription for this.  Then decrease to 20 mg daily.  We will stay on this until you see me again in a few weeks.  I sent a prescription for the doxycycline, 1 tablet twice a day for 7 days.  I refilled the promethazine cough syrup  Return to clinic in 6 weeks or sooner as needed with Dr. Silas Flood

## 2022-10-12 NOTE — Telephone Encounter (Signed)
PA has been DENIED due to no history of trial of Fasenra or Xolair.  Denial letter has been attached in patients documents.

## 2022-10-14 NOTE — Telephone Encounter (Signed)
Patient's absolute eos < 150 cells/uL. He does not have eosinophlic asthma or alelrgic asthma to qualify for Berna Bue or Xolair, respectively  Will work on appeal  Knox Saliva, PharmD, MPH, BCPS, CPP Clinical Pharmacist (Rheumatology and Pulmonology)

## 2022-10-16 NOTE — Telephone Encounter (Signed)
Received the following message from patient:   "Hi April, The steroids has left the inside top palate of my mouth and throat with oral thrush. I need some nystatin please.  Thanks."  Dr.Hunsucker, can you please advise? Thanks!

## 2022-10-17 MED ORDER — NYSTATIN 100000 UNIT/ML MT SUSP: 5.0000 mL | Freq: Four times a day (QID) | OROMUCOSAL | 0 refills | Status: AC

## 2022-10-17 NOTE — Addendum Note (Signed)
Addended byLarey Days on: 10/17/2022 08:23 PM   Modules accepted: Orders

## 2022-10-21 NOTE — Telephone Encounter (Signed)
Submitted an URGENT appeal to [facility] for [place].  Phone: [phone] Fax: [phone]  Knox Saliva, PharmD, MPH, BCPS, CPP Clinical Pharmacist (Rheumatology and Pulmonology)

## 2022-10-26 ENCOUNTER — Ambulatory Visit: Payer: Medicare Other | Admitting: Pulmonary Disease

## 2022-11-06 ENCOUNTER — Other Ambulatory Visit (HOSPITAL_COMMUNITY): Payer: Self-pay

## 2022-11-06 MED ORDER — TEZSPIRE 210 MG/1.91ML ~~LOC~~ SOAJ
210.0000 mg | SUBCUTANEOUS | 0 refills | Status: DC
  Filled 2022-11-06: qty 1.91, 28d supply, fill #0

## 2022-11-06 NOTE — Telephone Encounter (Signed)
Ran test claim since no Tezspire determination letter received. Per test claim, copay is $0 for 28 day supply. Per [employer] rep, DENIAL is OVERTURNED. Dorothea Ogle is now approved from 10/09/22 until further notice. She will refax approval letter.  PA Case # [id number] Call ref # [id number]  Phone: [phone], option 1  Rx sent to [facility] to be couriered to clinic 11/12/22.  ATC patient to schedule new start visit. Unable to reach. Left VM requesting return call.  Chesley Mires, PharmD, MPH, BCPS, CPP Clinical Pharmacist (Rheumatology and Pulmonology)

## 2022-11-09 ENCOUNTER — Other Ambulatory Visit (HOSPITAL_COMMUNITY): Payer: Self-pay

## 2022-11-09 NOTE — Telephone Encounter (Signed)
Confirmed that pt's Eugene Blackburn has already been delivered to [facility] clinic and is sitting next to your bin in the [facility].

## 2022-11-13 ENCOUNTER — Other Ambulatory Visit: Payer: Self-pay

## 2022-11-13 MED ORDER — BREZTRI AEROSPHERE 160-9-4.8 MCG/ACT IN AERO: 2.0000 | INHALATION_SPRAY | Freq: Two times a day (BID) | RESPIRATORY_TRACT | 1 refills | Status: DC

## 2022-11-16 NOTE — Telephone Encounter (Signed)
Received return call from patient. He left VM. He states that he'd like to hold off until his OV on 12/02/22 to further discuss with Dr. Judeth Horn in detail.  Chesley Mires, PharmD, MPH, BCPS, CPP Clinical Pharmacist (Rheumatology and Pulmonology)

## 2022-11-20 ENCOUNTER — Telehealth: Payer: Self-pay | Admitting: Primary Care

## 2022-11-20 ENCOUNTER — Other Ambulatory Visit: Payer: Self-pay | Admitting: Pulmonary Disease

## 2022-11-20 MED ORDER — NYSTATIN 100000 UNIT/ML MT SUSP: 5.0000 mL | Freq: Four times a day (QID) | OROMUCOSAL | 0 refills | Status: DC

## 2022-11-20 NOTE — Telephone Encounter (Signed)
Patient called on-call service requesting something be called in for thursh symptom. Rx Nystatin S/S 89ml QID sent to [facility].

## 2022-11-23 ENCOUNTER — Other Ambulatory Visit: Payer: Self-pay | Admitting: Pulmonary Disease

## 2022-11-23 NOTE — Telephone Encounter (Signed)
Please advise on med refill. 

## 2022-11-23 NOTE — Telephone Encounter (Signed)
Ok with refill - nystatin 500,000 units QID for 10 days.

## 2022-11-23 NOTE — Telephone Encounter (Signed)
Mychart message sent by pt: Eugene Blackburn [facility] (supporting Karren Burly, MD)3 days ago    Hi april, I am still fighting with oral thrush. I have it on the corners of my mouth and on the back of my throat. Could I get some more NYstatin please. I would greatly appreciate it. I tried gargling with peroxide. But that just don't do the job like Nystatin does. I will see y'all on the 6th. Thank you     Dr. Judeth Horn, please advise.

## 2022-11-23 NOTE — Telephone Encounter (Signed)
Advise he discuss with his PCP as adjustments were made to diuretic therapy.

## 2022-11-27 ENCOUNTER — Encounter: Payer: Self-pay | Admitting: Emergency Medicine

## 2022-11-27 ENCOUNTER — Ambulatory Visit
Admission: EM | Admit: 2022-11-27 | Discharge: 2022-11-27 | Disposition: A | Discharge status: Home / Self Care | Payer: Medicare Other | Attending: Family Medicine | Admitting: Family Medicine

## 2022-11-27 DIAGNOSIS — J441 Chronic obstructive pulmonary disease with (acute) exacerbation: Secondary | ICD-10-CM | POA: Diagnosis not present

## 2022-11-27 MED ORDER — DOXYCYCLINE HYCLATE 100 MG PO TABS: 100.0000 mg | ORAL_TABLET | Freq: Two times a day (BID) | ORAL | 0 refills | Status: DC

## 2022-11-27 MED ORDER — PREDNISONE 10 MG (21) PO TBPK: ORAL_TABLET | Freq: Every day | ORAL | 0 refills | Status: DC

## 2022-11-27 MED ORDER — PROMETHAZINE-DM 6.25-15 MG/5ML PO SYRP: 5.0000 mL | ORAL_SOLUTION | Freq: Every evening | ORAL | 0 refills | Status: DC | PRN

## 2022-11-27 NOTE — ED Triage Notes (Signed)
Patient c/o cough and chest congestion for over a week.  Patient states that his cough has gotten worse over the past 2-3 days. Patient reports chills.  Patient denies recent fevers.

## 2022-11-27 NOTE — ED Provider Notes (Signed)
[facility]    CSN: [id number] Arrival date & time: 11/27/22  0804      History   Chief Complaint Chief Complaint  Patient presents with   Cough    HPI Eugene Blackburn is a 51 y.o. male.   HPI   Eugene Blackburn presents for cough for the past couple weeks. Has not been sleeping at night. Has been falling asleep.  He is having coughing fits.  Has chest tightness. Ran out of his cough syrup.  Denies fever. Had chills for a couple days but not anymore. Has chest congestion but no rhinorrhea or nasal congestion. Has COPD and quit about 4 years ago. Takes 20-40 mg a day.  Has a lung doctor has an appointment next week. Takes Lasix 40 mg. Denies dyspnea on exertion.    Past Medical History:  Diagnosis Date   Asthma    Chronic idiopathic constipation    Chronic obstruct airways disease (HCC)    Chronic pancreatitis (HCC)    Degenerative disorder of muscle    Hearing loss    Hemorrhoids    Hyperlipidemia    Insomnia    Male erectile dysfunction    Nausea    Pancreas (digestive gland) works poorly    Seasonal allergic rhinitis    Vitamin D deficiency     Patient Active Problem List   Diagnosis Date Noted   Tremor 01/26/2019   Numbness and tingling of both legs 01/26/2019    Past Surgical History:  Procedure Laterality Date   PANCREAS SURGERY         Home Medications    Prior to Admission medications   Medication Sig Start Date End Date Taking? Authorizing Provider  predniSONE (STERAPRED UNI-PAK 21 TAB) 10 MG (21) TBPK tablet Take by mouth daily. Take 6 tabs by mouth daily  for 2 days, then 5 tabs for 2 days, then 4 tabs for 2 days, then 3 tabs for 2 days, 2 tabs for 2 days, then 1 tab by mouth daily for 2 days 11/27/22  Yes Rhodesia Stanger, DO  BREZTRI AEROSPHERE 160-9-4.8 MCG/ACT AERO Inhale 2 puffs into the lungs 2 (two) times daily. 11/13/22   Hunsucker, Lesia Sago, MD  clonazePAM (KLONOPIN) 0.5 MG tablet Take 0.5 mg by mouth 2 (two) times daily as needed.  01/14/22   [provider]  doxycycline (VIBRA-TABS) 100 MG tablet Take 1 tablet (100 mg total) by mouth 2 (two) times daily. 11/27/22   Milca Sytsma, Seward Meth, DO  fluticasone (FLONASE) 50 MCG/ACT nasal spray Place 1 spray into both nostrils daily.    [provider]  fluticasone-salmeterol (ADVAIR HFA) 230-21 MCG/ACT inhaler Inhale 2 puffs into the lungs 2 (two) times daily. 02/09/22   Hunsucker, Lesia Sago, MD  furosemide (LASIX) 20 MG tablet Take 1 tablet (20 mg total) by mouth daily. 05/05/22   Hunsucker, Lesia Sago, MD  ibuprofen (ADVIL,MOTRIN) 600 MG tablet Take 1 tablet (600 mg total) by mouth every 6 (six) hours as needed. 04/17/18   Domenick Gong, MD  ipratropium (ATROVENT) 0.06 % nasal spray Place 2 sprays into both nostrils 4 (four) times daily. 03/12/22   Becky Augusta, NP  LINZESS 145 MCG CAPS capsule Take 145 mcg by mouth daily as needed. 04/03/22   [provider]  nystatin (MYCOSTATIN) 100000 UNIT/ML suspension Take 5 mLs (500,000 Units total) by mouth 4 (four) times daily. 11/20/22   Glenford Bayley, NP  ondansetron (ZOFRAN) 4 MG tablet Take 4 mg by mouth every 6 (six)  hours as needed. 04/03/22   [provider]  oxyCODONE (OXYCONTIN) 20 mg 12 hr tablet Take 30 mg by mouth every 12 (twelve) hours.     [provider]  PANCREAZE 00938 units CPEP TAKE ONE CAPSULE BY MOUTH 3 TIMES A DAY 02/01/18   [provider]  pantoprazole (PROTONIX) 40 MG tablet Take 40 mg by mouth daily.    [provider]  predniSONE (DELTASONE) 20 MG tablet Take 1 tablet (20 mg total) by mouth daily with breakfast. 10/02/22   Hunsucker, Lesia Sago, MD  promethazine-dextromethorphan (PROMETHAZINE-DM) 6.25-15 MG/5ML syrup Take 5 mLs by mouth at bedtime as needed. 11/27/22   Stephanieann Popescu, Seward Meth, DO  Tezepelumab-ekko (TEZSPIRE) 210 MG/1. SOAJ Inject 210 mg into the skin every 28 (twenty-eight) days. Courier to pulm: [address], [place] [address]  Appt on  11/12/22 11/06/22   Hunsucker, Lesia Sago, MD  VENTOLIN HFA 108 (90 Base) MCG/ACT inhaler 2 puffs every 4 (four) hours as needed. Last taken: 1015am. Today. 02/25/18   [provider]    Family History Family History  Problem Relation Age of Onset   Lung cancer Mother    Pancreatic disease Father     Social History Social History   Tobacco Use   Smoking status: Former    Types: Cigarettes   Smokeless tobacco: Never  Vaping Use   Vaping Use: Former  Substance Use Topics   Alcohol use: No    Alcohol/week: 0.0 standard drinks of alcohol   Drug use: No     Allergies   Gabapentin, Zithromax [azithromycin], Levaquin [levofloxacin], and Penicillin g   Review of Systems Review of Systems: negative unless otherwise stated in HPI.      Physical Exam Triage Vital Signs ED Triage Vitals  Enc Vitals Group     BP 11/27/22 0847 123/74     Pulse Rate 11/27/22 0847 (!) 114     Resp 11/27/22 0847 20     Temp 11/27/22 0847 98.7 F (37.1 C)     Temp Source 11/27/22 0847 Oral     SpO2 11/27/22 0847 97 %     Weight 11/27/22 0844 235 lb 10.8 oz (106.9 kg)     Height 11/27/22 0844 6' (1.829 m)     Head Circumference --      Peak Flow --      Pain Score 11/27/22 0844 0     Pain Loc --      Pain Edu? --      Excl. in GC? --    No data found.  Updated Vital Signs BP 123/74 (BP Location: Right Arm)   Pulse (!) 114   Temp 98.7 F (37.1 C) (Oral)   Resp 20   Ht 6' (1.829 m)   Wt 106.9 kg   SpO2 97%   BMI 31.96 kg/m   Visual Acuity Right Eye Distance:   Left Eye Distance:   Bilateral Distance:    Right Eye Near:   Left Eye Near:    Bilateral Near:     Physical Exam GEN:     alert, non-toxic appearing male in no distress    HENT:  mucus membranes moist, no nasal discharge EYES:    no scleral injection or discharge NECK:  normal ROM RESP:  no increased work of breathing, diffuse rhonchi and expiratory wheezing, no rales,  CVS:   regular rate and rhythm Skin:    warm and dry, no rash on visible skin  UC Treatments / Results  Labs (all labs ordered are listed, but only abnormal results are displayed) Labs Reviewed - No data to display  EKG   Radiology No results found.  Procedures Procedures (including critical care time)  Medications Ordered in UC Medications - No data to display  Initial Impression / Assessment and Plan / UC Course  I have reviewed the triage vital signs and the nursing notes.  Pertinent labs & imaging results that were available during my care of the patient were reviewed by me and considered in my medical decision making (see chart for details).       Pt is a 51 y.o. male former smoker with COPD who presents for 1-2 weeks of cough that is acutely worsening.  Eugene Blackburn is  afebrile here without recent antipyretics. Satting adequately on room air (93-97%). Overall pt is  non-toxic appearing, well hydrated, without respiratory distress. Pulmonary exam is remarkable for expiratory wheezing and rhonchi.  After shared decision making we will not pursue chest x-ray at this time as it currently would not change management.  COVID  and influenza testing deferred due to length of symptoms.    Treat acute COPD exacerbation with steroid taper and antibiotics as below.  Offered breathing treatment here but patient states he is allergic to breathing treatments.  Promethazine DM cough syrup given for cough and allow patient to rest.  Typical duration of symptoms discussed. Return and ED precautions given and patient voiced understanding.   Discussed MDM, treatment plan and plan for follow-up with his pulmonologist and patient agrees with plan.       Final Clinical Impressions(s) / UC Diagnoses   Final diagnoses:  COPD exacerbation [facility])     Discharge Instructions      Stop by the pharmacy to pick up your prescriptions.  Follow up with your primary care provider as needed.  Go to ED for red flag symptoms, including;  fevers you cannot reduce with Tylenol/Motrin, severe headaches, vision changes, numbness/weakness in part of the body, lethargy, confusion, intractable vomiting, severe dehydration, chest pain, breathing difficulty, severe persistent abdominal or pelvic pain, signs of severe infection (increased redness, swelling of an area), feeling faint or passing out, dizziness, etc. You should especially go to the ED for sudden acute worsening of condition if you do not elect to go at this time.       ED Prescriptions     Medication Sig Dispense Auth. Provider   doxycycline (VIBRA-TABS) 100 MG tablet Take 1 tablet (100 mg total) by mouth 2 (two) times daily. 14 tablet Josearmando Kuhnert, DO   predniSONE (STERAPRED UNI-PAK 21 TAB) 10 MG (21) TBPK tablet Take by mouth daily. Take 6 tabs by mouth daily  for 2 days, then 5 tabs for 2 days, then 4 tabs for 2 days, then 3 tabs for 2 days, 2 tabs for 2 days, then 1 tab by mouth daily for 2 days 42 tablet Zitlaly Malson, DO   promethazine-dextromethorphan (PROMETHAZINE-DM) 6.25-15 MG/5ML syrup Take 5 mLs by mouth at bedtime as needed. 118 mL Katha Cabal, DO      PDMP not reviewed this encounter.   Katha Cabal, DO 11/27/22 4098

## 2022-11-27 NOTE — Discharge Instructions (Signed)

## 2022-11-30 ENCOUNTER — Other Ambulatory Visit: Payer: Self-pay | Admitting: Pulmonary Disease

## 2022-12-01 ENCOUNTER — Other Ambulatory Visit (HOSPITAL_COMMUNITY): Payer: Self-pay

## 2022-12-02 ENCOUNTER — Ambulatory Visit: Payer: Medicare Other | Admitting: Pulmonary Disease

## 2022-12-14 ENCOUNTER — Ambulatory Visit (INDEPENDENT_AMBULATORY_CARE_PROVIDER_SITE_OTHER): Payer: Medicare Other

## 2022-12-14 ENCOUNTER — Encounter: Payer: Self-pay | Admitting: Emergency Medicine

## 2022-12-14 ENCOUNTER — Ambulatory Visit
Admission: EM | Admit: 2022-12-14 | Discharge: 2022-12-14 | Disposition: A | Discharge status: Home / Self Care | Payer: Medicare Other | Attending: Internal Medicine | Admitting: Internal Medicine

## 2022-12-14 DIAGNOSIS — J40 Bronchitis, not specified as acute or chronic: Secondary | ICD-10-CM | POA: Diagnosis not present

## 2022-12-14 DIAGNOSIS — R059 Cough, unspecified: Secondary | ICD-10-CM | POA: Diagnosis not present

## 2022-12-14 DIAGNOSIS — M940 Chondrocostal junction syndrome [Tietze]: Secondary | ICD-10-CM | POA: Diagnosis not present

## 2022-12-14 DIAGNOSIS — B370 Candidal stomatitis: Secondary | ICD-10-CM

## 2022-12-14 DIAGNOSIS — R079 Chest pain, unspecified: Secondary | ICD-10-CM | POA: Diagnosis not present

## 2022-12-14 MED ORDER — CLINDAMYCIN HCL 300 MG PO CAPS: 300.0000 mg | ORAL_CAPSULE | Freq: Two times a day (BID) | ORAL | 0 refills | Status: DC

## 2022-12-14 MED ORDER — NYSTATIN 100000 UNIT/ML MT SUSP: 5.0000 mL | Freq: Four times a day (QID) | OROMUCOSAL | 0 refills | Status: DC

## 2022-12-14 MED ORDER — PROMETHAZINE-DM 6.25-15 MG/5ML PO SYRP: 5.0000 mL | ORAL_SOLUTION | Freq: Every evening | ORAL | 0 refills | Status: DC | PRN
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
## 2023-01-30 ENCOUNTER — Other Ambulatory Visit: Payer: Self-pay | Admitting: Pulmonary Disease

## 2023-02-10 ENCOUNTER — Telehealth: Payer: Self-pay

## 2023-02-10 NOTE — Telephone Encounter (Signed)
Got a message for [facility] and it stated that the Advair was not covered under insurance. Can we run a ticket to see what is covered under insurance.   Thank you

## 2023-02-12 ENCOUNTER — Emergency Department: Payer: Medicare Other

## 2023-02-12 ENCOUNTER — Other Ambulatory Visit: Payer: Self-pay

## 2023-02-12 ENCOUNTER — Emergency Department
Admission: EM | Admit: 2023-02-12 | Discharge: 2023-02-12 | Disposition: A | Discharge status: Home / Self Care | Payer: Medicare Other | Attending: Emergency Medicine | Admitting: Emergency Medicine

## 2023-02-12 DIAGNOSIS — N289 Disorder of kidney and ureter, unspecified: Secondary | ICD-10-CM

## 2023-02-12 DIAGNOSIS — K5732 Diverticulitis of large intestine without perforation or abscess without bleeding: Secondary | ICD-10-CM | POA: Insufficient documentation

## 2023-02-12 DIAGNOSIS — K869 Disease of pancreas, unspecified: Secondary | ICD-10-CM

## 2023-02-12 DIAGNOSIS — J45909 Unspecified asthma, uncomplicated: Secondary | ICD-10-CM | POA: Insufficient documentation

## 2023-02-12 DIAGNOSIS — R1010 Upper abdominal pain, unspecified: Secondary | ICD-10-CM | POA: Diagnosis present

## 2023-02-12 DIAGNOSIS — K5792 Diverticulitis of intestine, part unspecified, without perforation or abscess without bleeding: Secondary | ICD-10-CM

## 2023-02-12 LAB — CBC WITH DIFFERENTIAL/PLATELET
Abs Immature Granulocytes: 0.09 10*3/uL — ABNORMAL HIGH (ref 0.00–0.07)
Basophils Absolute: 0.1 10*3/uL (ref 0.0–0.1)
Basophils Relative: 0 %
Eosinophils Absolute: 0.1 10*3/uL (ref 0.0–0.5)
Eosinophils Relative: 1 %
HCT: 45.4 % (ref 39.0–52.0)
Hemoglobin: 15.7 g/dL (ref 13.0–17.0)
Immature Granulocytes: 1 %
Lymphocytes Relative: 9 %
Lymphs Abs: 1.5 10*3/uL (ref 0.7–4.0)
MCH: 33.7 pg (ref 26.0–34.0)
MCHC: 34.6 g/dL (ref 30.0–36.0)
MCV: 97.4 fL (ref 80.0–100.0)
Monocytes Absolute: 1.1 10*3/uL — ABNORMAL HIGH (ref 0.1–1.0)
Monocytes Relative: 7 %
Neutro Abs: 13 10*3/uL — ABNORMAL HIGH (ref 1.7–7.7)
Neutrophils Relative %: 82 %
Platelets: 336 10*3/uL (ref 150–400)
RBC: 4.66 MIL/uL (ref 4.22–5.81)
RDW: 14.2 % (ref 11.5–15.5)
WBC: 15.8 10*3/uL — ABNORMAL HIGH (ref 4.0–10.5)
nRBC: 0 % (ref 0.0–0.2)

## 2023-02-12 LAB — COMPREHENSIVE METABOLIC PANEL
ALT: 17 U/L (ref 0–44)
AST: 19 U/L (ref 15–41)
Albumin: 4.4 g/dL (ref 3.5–5.0)
Alkaline Phosphatase: 51 U/L (ref 38–126)
Anion gap: 10 (ref 5–15)
BUN: 19 mg/dL (ref 6–20)
CO2: 32 mmol/L (ref 22–32)
Calcium: 9.2 mg/dL (ref 8.9–10.3)
Chloride: 94 mmol/L — ABNORMAL LOW (ref 98–111)
Creatinine, Ser: 1.05 mg/dL (ref 0.61–1.24)
GFR, Estimated: >60 mL/min (ref >60)
Glucose, Bld: 101 mg/dL — ABNORMAL HIGH (ref 70–99)
Potassium: 3.6 mmol/L (ref 3.5–5.1)
Sodium: 136 mmol/L (ref 135–145)
Total Bilirubin: 0.7 mg/dL (ref 0.3–1.2)
Total Protein: 7.6 g/dL (ref 6.5–8.1)

## 2023-02-12 LAB — URINALYSIS, ROUTINE W REFLEX MICROSCOPIC
Bacteria, UA: NONE SEEN
Bilirubin Urine: NEGATIVE
Glucose, UA: NEGATIVE mg/dL
Hgb urine dipstick: NEGATIVE
Ketones, ur: NEGATIVE mg/dL
Nitrite: NEGATIVE
Protein, ur: NEGATIVE mg/dL
Specific Gravity, Urine: 1.028 (ref 1.005–1.030)
pH: 6 (ref 5.0–8.0)

## 2023-02-12 LAB — LIPASE, BLOOD: Lipase: 33 U/L (ref 11–51)

## 2023-02-12 MED ORDER — ONDANSETRON 4 MG PO TBDP: 4.0000 mg | ORAL_TABLET | Freq: Three times a day (TID) | ORAL | 0 refills | Status: DC | PRN

## 2023-02-12 MED ORDER — AMOXICILLIN-POT CLAVULANATE 875-125 MG PO TABS: 1.0000 | ORAL_TABLET | Freq: Two times a day (BID) | ORAL | 0 refills | Status: AC

## 2023-02-12 MED ORDER — ONDANSETRON HCL 4 MG/2ML IJ SOLN
4.0000 mg | Freq: Once | INTRAMUSCULAR | Status: AC
  Administered 2023-02-12: 4 mg via INTRAVENOUS
  Filled 2023-02-12: qty 2

## 2023-02-12 MED ORDER — LACTATED RINGERS IV BOLUS
1000.0000 mL | Freq: Once | INTRAVENOUS | Status: AC
  Administered 2023-02-12: 1000 mL via INTRAVENOUS

## 2023-02-12 MED ORDER — IOHEXOL 300 MG/ML  SOLN
100.0000 mL | Freq: Once | INTRAMUSCULAR | Status: AC | PRN
  Administered 2023-02-12: 100 mL via INTRAVENOUS

## 2023-02-12 MED ORDER — HYDROMORPHONE HCL 1 MG/ML IJ SOLN
1.0000 mg | Freq: Once | INTRAMUSCULAR | Status: AC
  Administered 2023-02-12: 1 mg via INTRAVENOUS
  Filled 2023-02-12: qty 1

## 2023-02-12 MED ORDER — MORPHINE SULFATE (PF) 4 MG/ML IV SOLN
4.0000 mg | Freq: Once | INTRAVENOUS | Status: AC
  Administered 2023-02-12: 4 mg via INTRAVENOUS
  Filled 2023-02-12: qty 1
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
## 2023-02-12 NOTE — ED Notes (Signed)
Patient declined EKG.

## 2023-02-12 NOTE — Discharge Instructions (Addendum)
-  CT scan showed that you have diverticulitis.  Please take the full course of the antibiotics as prescribed.  You may additionally take ondansetron as needed for the nausea and oxycodone as needed for pain.  -The CT scan also showed that you had a few lesions on your kidneys.  These will need to be further evaluated by urology.  Please schedule appoint with the urologist listed on this page for further evaluation.  -Lastly, your CT scan showed that you had a lesion on your pancreas.  Please follow-up with your primary care provider to schedule an outpatient MRI to further evaluate.  -Please follow-up with your primary care provider within the next few days to ensure some improvement in your symptoms.  -Return to the emergency department anytime if you begin to experience any new or worsening symptoms.

## 2023-02-12 NOTE — ED Provider Notes (Signed)
[facility] Provider Note    Event Date/Time   First MD Initiated Contact with Patient 02/12/23 0900     (approximate)   History   Chief Complaint Abdominal Pain   HPI Eugene Blackburn [facility] is a 52 y.o. male, history of chronic pancreatitis, asthma, constipation, hyperlipidemia, presents to the emergency department for evaluation of upper abdominal pain x 1 day.  He states that it started last night after eating deli meat and drinking a glass of beer.  He states that this feels similar to his previous pancreatitis episodes, however much more severe.  Endorses nausea, no vomiting.  Denies chest pain, shortness of breath, diarrhea, urinary symptoms, headache, weakness, rashes, paresthesias, or dizziness/lightheadedness.  History Limitations: No limitations.        Physical Exam  Triage Vital Signs: ED Triage Vitals  Enc Vitals Group     BP 02/12/23 0842 138/89     Pulse Rate 02/12/23 0842 (!) 107     Resp 02/12/23 0840 18     Temp 02/12/23 0842 98.1 F (36.7 C)     Temp Source 02/12/23 0842 Oral     SpO2 02/12/23 0840 96 %     Weight 02/12/23 0856 235 lb 10.8 oz (106.9 kg)     Height 02/12/23 0856 6' (1.829 m)     Head Circumference --      Peak Flow --      Pain Score 02/12/23 0841 10     Pain Loc --      Pain Edu? --      Excl. in [place]? --     Most recent vital signs: Vitals:   02/12/23 0842 02/12/23 1220  BP: 138/89 (!) 134/98  Pulse: (!) 107 98  Resp:  18  Temp: 98.1 F (36.7 C)   SpO2:  100%    General: Awake, appears distressed and in pain. Skin: Warm, dry. No rashes or lesions.  Eyes: PERRL. Conjunctivae normal.  CV: Good peripheral perfusion.  Resp: Normal effort.  Neuro: At baseline. No gross neurological deficits.  Musculoskeletal: Normal ROM of all extremities.  Focused Exam: Patient is guarding abdomen.  Exquisitely tender with palpation, particularly in the lower abdomen and midline upper abdomen.  Physical Exam    ED  Results / Procedures / Treatments  Labs (all labs ordered are listed, but only abnormal results are displayed) Labs Reviewed  CBC WITH DIFFERENTIAL/PLATELET - Abnormal; Notable for the following components:      Result Value   WBC 15.8 (*)    Neutro Abs 13.0 (*)    Monocytes Absolute 1.1 (*)    Abs Immature Granulocytes 0.09 (*)    All other components within normal limits  COMPREHENSIVE METABOLIC PANEL - Abnormal; Notable for the following components:   Chloride 94 (*)    Glucose, Bld 101 (*)    All other components within normal limits  URINALYSIS, ROUTINE W REFLEX MICROSCOPIC - Abnormal; Notable for the following components:   Color, Urine YELLOW (*)    APPearance CLEAR (*)    Leukocytes,Ua TRACE (*)    All other components within normal limits  LIPASE, BLOOD     EKG Patient refuses EKG, stating "I know this is not related to my heart".    RADIOLOGY  ED Provider Interpretation: I personally reviewed and interpreted the CT scan, findings suggestive of acute diverticulitis.  Right upper pole lesion noted  CT Abdomen Pelvis W Contrast  Result Date: 02/12/2023 CLINICAL DATA:  Severe acute pancreatitis.  *  Tracking Code: BO * EXAM: CT ABDOMEN AND PELVIS WITH CONTRAST TECHNIQUE: Multidetector CT imaging of the abdomen and pelvis was performed using the standard protocol following bolus administration of intravenous contrast. RADIATION DOSE REDUCTION: This exam was performed according to the departmental dose-optimization program which includes automated exposure control, adjustment of the mA and/or kV according to patient size and/or use of iterative reconstruction technique. CONTRAST:  126m OMNIPAQUE IOHEXOL 300 MG/ML  SOLN COMPARISON:  CT July 15, 2008 . FINDINGS: Lower chest: Bibasilar atelectasis. Marked elevation of the left hemidiaphragm. Hepatobiliary: No suspicious hepatic lesion. Gallbladder is unremarkable. No biliary ductal dilation. Pancreas: Calcifications in the  pancreatic head. No pancreatic ductal dilation. 18 mm cystic lesion along the anterior pancreatic head on image 31/3. Spleen: No splenomegaly. Adrenals/Urinary Tract: Bilateral adrenal glands appear normal. No hydronephrosis. Partially exophytic 18 mm left interpolar renal lesion measures Hounsfield units of 47. Solid enhancing 19 mm right upper pole renal lesion on image 24/3. Urinary bladder is unremarkable for degree of distension. Stomach/Bowel: Stomach is unremarkable for degree of distension. No pathologic dilation of small or large bowel. Left-sided colonic diverticulosis with mild acute uncomplicated sigmoid colonic diverticulitis. Vascular/Lymphatic: Aortic atherosclerosis. Smooth IVC contours. Patent right renal vein without tumor thrombus. No pathologically enlarged abdominal or pelvic lymph nodes. Reproductive: Prostate is unremarkable. Other: No significant abdominopelvic free fluid. Musculoskeletal: No aggressive lytic or blastic lesion of bone. Marked L5-S1 discogenic disease. Crescentic sclerosis in the bilateral femoral heads may reflect AVN. IMPRESSION: 1. Mild acute uncomplicated sigmoid colonic diverticulitis. 2. Solid enhancing 19 mm right upper pole renal lesion, compatible with renal cell carcinoma. Suggest urology consult. No evidence of renal tumor in vein or abdominopelvic metastasis. 3. Partially exophytic 18 mm left interpolar renal lesion measures Hounsfield units of 47. While this may reflect a hemorrhagic/proteinaceous cyst a solid renal neoplasm is not excluded. Suggest more definitive characterization with nonemergent renal protocol MRI with and without contrast. 4. 18 mm cystic lesion along the anterior pancreatic head, nonspecific likely reflecting a side branch IPMN or pseudocyst. This could be further evaluated with the above MRI with and without contrast. 5. Calcifications in the pancreatic head, suggestive of chronic pancreatitis. No CT evidence of acute pancreatitis 6.  Aortic  Atherosclerosis (ICD10-I70.0). Electronically Signed   By: JDahlia BailiffM.D.   On: 02/12/2023 11:01    PROCEDURES:  Critical Care performed: N/A.  Procedures    MEDICATIONS ORDERED IN ED: Medications  lactated ringers bolus 1,000 mL (0 mLs Intravenous Stopped 02/12/23 1137)  morphine (PF) 4 MG/ML injection 4 mg (4 mg Intravenous Given 02/12/23 0936)  ondansetron (ZOFRAN) injection 4 mg (4 mg Intravenous Given 02/12/23 0935)  iohexol (OMNIPAQUE) 300 MG/ML solution 100 mL (100 mLs Intravenous Contrast Given 02/12/23 1032)  HYDROmorphone (DILAUDID) injection 1 mg (1 mg Intravenous Given 02/12/23 1021)     IMPRESSION / MDM / ASSESSMENT AND PLAN / ED COURSE  I reviewed the triage vital signs and the nursing notes.                              Differential diagnosis includes, but is not limited to, pancreatitis, diverticulitis, cholecystitis, colitis, pyelonephritis, ureterolithiasis.   ED Course Patient appears well, vitals within normal limits.  NAD.  CBC shows leukocytosis at 15.8.  No anemia or thrombocytopenia.  CMP shows no electrolyte abnormalities, AKI, or transaminitis.  Urinalysis shows trace leukocytes, otherwise no evidence of infection.  Lipase unremarkable at 33.  Assessment/Plan  Patient presents with upper/lower abdominal pain x 1 day.  Initially on exam, patient was in significant amount of pain, though after analgesics he appears quite well.  Vitals are within normal limits.  His lab workup does show a leukocytosis of 15.8, otherwise no signs of liver/gallbladder pathology or evidence of urinary tract infection.  His CT scan did show a mild acute diverticulitis, which I suspect explains most of his symptoms.  Incidentally, also found solid enhancing 9 mm right upper pole renal lesion, compatible with renal cell carcinoma and partially exophytic 18 mm left interpolar renal lesion.  Spoke to urology about it, who recommended outpatient follow-up and monitoring.  Other  incidental findings were communicated to the patient and advised that he will need outpatient follow-up.  In regards to his treatment today, we will provide him with medications to help treat his diverticulitis.  Encouraged him to follow with his primary care provider within the next few days as needed.  He was amenable to this.  Will discharge.  Considered admission for this patient, but given his stable presentation and close access to follow-up, is unlikely benefit from admission.  Provided the patient with anticipatory guidance, return precautions, and educational material. Encouraged the patient to return to the emergency department at any time if they begin to experience any new or worsening symptoms. Patient expressed understanding and agreed with the plan.   Patient's presentation is most consistent with acute complicated illness / injury requiring diagnostic workup.       FINAL CLINICAL IMPRESSION(S) / ED DIAGNOSES   Final diagnoses:  Diverticulitis  Renal lesion  Pancreatic lesion     Rx / DC Orders   ED Discharge Orders          Ordered    amoxicillin-clavulanate (AUGMENTIN) 875-125 MG tablet  2 times daily        02/12/23 1312    ondansetron (ZOFRAN-ODT) 4 MG disintegrating tablet  Every 8 hours PRN        02/12/23 1312             Note:  This document was prepared using Dragon voice recognition software and may include unintentional dictation errors.   Teodoro Spray, [place] 02/12/23 1314    Nathaniel Man, MD 02/13/23 [phone]

## 2023-02-15 ENCOUNTER — Other Ambulatory Visit (HOSPITAL_COMMUNITY): Payer: Self-pay

## 2023-02-15 NOTE — Telephone Encounter (Signed)
Per test claim medication is currently covered. Eligibility check is showing that patient has a Medicare D plan.

## 2023-02-22 NOTE — Telephone Encounter (Signed)
Called and left message for patient to call office back if he is having issues obtaining his Advair. Left office number. Nothing further needed

## 2023-02-25 ENCOUNTER — Other Ambulatory Visit: Payer: Self-pay | Admitting: Pulmonary Disease

## 2023-03-27 ENCOUNTER — Other Ambulatory Visit: Payer: Self-pay | Admitting: Pulmonary Disease

## 2023-04-09 ENCOUNTER — Ambulatory Visit: Payer: Medicaid Other | Admitting: Urology

## 2023-04-16 ENCOUNTER — Ambulatory Visit (INDEPENDENT_AMBULATORY_CARE_PROVIDER_SITE_OTHER): Payer: Medicare Other

## 2023-04-16 ENCOUNTER — Ambulatory Visit
Admission: EM | Admit: 2023-04-16 | Discharge: 2023-04-16 | Disposition: A | Discharge status: Home / Self Care | Payer: Medicare Other | Attending: Family Medicine | Admitting: Family Medicine

## 2023-04-16 DIAGNOSIS — J4551 Severe persistent asthma with (acute) exacerbation: Secondary | ICD-10-CM | POA: Diagnosis not present

## 2023-04-16 DIAGNOSIS — J209 Acute bronchitis, unspecified: Secondary | ICD-10-CM | POA: Diagnosis not present

## 2023-04-16 DIAGNOSIS — R0602 Shortness of breath: Secondary | ICD-10-CM

## 2023-04-16 DIAGNOSIS — Z87891 Personal history of nicotine dependence: Secondary | ICD-10-CM

## 2023-04-16 MED ORDER — ALBUTEROL SULFATE (2.5 MG/3ML) 0.083% IN NEBU
2.5000 mg | INHALATION_SOLUTION | RESPIRATORY_TRACT | Status: DC | PRN
  Administered 2023-04-16: 2.5 mg via RESPIRATORY_TRACT

## 2023-04-16 MED ORDER — PREDNISONE 10 MG (21) PO TBPK: ORAL_TABLET | Freq: Every day | ORAL | 0 refills | Status: DC

## 2023-04-16 MED ORDER — DOXYCYCLINE HYCLATE 100 MG PO CAPS: 100.0000 mg | ORAL_CAPSULE | Freq: Two times a day (BID) | ORAL | 0 refills | Status: AC

## 2023-04-16 MED ORDER — PROMETHAZINE-DM 6.25-15 MG/5ML PO SYRP: 5.0000 mL | ORAL_SOLUTION | Freq: Every evening | ORAL | 0 refills | Status: DC | PRN

## 2023-04-16 NOTE — Discharge Instructions (Addendum)
Stop by the pharmacy to pick up your prescriptions.  Follow-up with your pulmonologist.  If your shortness of breath worsens, go to the emergency department and/or call EMS.
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
## 2023-04-16 NOTE — ED Triage Notes (Signed)
Pt states that he sees a Pulmonologist in [place] who declined his prednisone due to needing a visit. Pt asks for a refill of prednisone.
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
## 2023-04-17 ENCOUNTER — Encounter: Payer: Self-pay | Admitting: Pulmonary Disease

## 2023-04-19 MED ORDER — PREDNISONE 20 MG PO TABS: ORAL_TABLET | ORAL | 0 refills | Status: DC

## 2023-04-19 NOTE — Telephone Encounter (Signed)
Ok for 20 mg once daily, #30, 0 refills. This medicine is too risky to continue to prescribe without being seen in clinic. I understand he is having difficulties, but please schedule an appointment with me after May 3. No further refills can be authorized without follow up.
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
## 2023-04-28 ENCOUNTER — Ambulatory Visit: Payer: Medicaid Other | Admitting: Urology

## 2023-05-14 ENCOUNTER — Ambulatory Visit: Payer: Medicaid Other | Admitting: Urology

## 2023-05-20 ENCOUNTER — Other Ambulatory Visit: Payer: Self-pay | Admitting: Pulmonary Disease

## 2023-05-25 ENCOUNTER — Encounter: Payer: Self-pay | Admitting: Pulmonary Disease

## 2023-05-28 ENCOUNTER — Ambulatory Visit: Payer: Medicare Other

## 2023-05-28 ENCOUNTER — Ambulatory Visit
Admission: EM | Admit: 2023-05-28 | Discharge: 2023-05-28 | Disposition: A | Discharge status: Home / Self Care | Payer: Medicare Other

## 2023-05-28 DIAGNOSIS — R1084 Generalized abdominal pain: Secondary | ICD-10-CM

## 2023-05-28 NOTE — Discharge Instructions (Signed)
Patient left without completing evaluation.

## 2023-05-28 NOTE — ED Provider Notes (Signed)
[facility]    CSN: [id number] Arrival date & time: 05/28/23  0802      History   Chief Complaint Chief Complaint  Patient presents with   Abdominal Pain   Nausea    HPI Eugene Blackburn is a 52 y.o. male.   HPI  52 year old male with a past medical history significant for asthma, chronic pancreatitis, chronic idiopathic constipation, hyperlipidemia, and chronic obstructive airway disease presents for evaluation of 5 days worth of sharp, generalized abdominal pain and nausea.  He states this started approximately 1 hour after he ate a pizza from [employer].  He states the pizza did not taste right.  He states that he has been to keep down water but he cannot keep down broth or food.  He has not checked for fever at home.  He denies vomiting but he does endorse some episodes of diarrhea.  Past Medical History:  Diagnosis Date   Asthma    Chronic idiopathic constipation    Chronic obstruct airways disease (HCC)    Chronic pancreatitis (HCC)    Degenerative disorder of muscle    Hearing loss    Hemorrhoids    Hyperlipidemia    Insomnia    Male erectile dysfunction    Nausea    Pancreas (digestive gland) works poorly    Seasonal allergic rhinitis    Vitamin D deficiency     Patient Active Problem List   Diagnosis Date Noted   Tremor 01/26/2019   Numbness and tingling of both legs 01/26/2019    Past Surgical History:  Procedure Laterality Date   PANCREAS SURGERY         Home Medications    Prior to Admission medications   Medication Sig Start Date End Date Taking? Authorizing Provider  ADVAIR HFA 230-21 MCG/ACT inhaler INHALE 2 PUFFS INTO THE LUNGS TWICE A DAY 02/25/23   Hunsucker, Lesia Sago, MD  BREZTRI AEROSPHERE 160-9-4.8 MCG/ACT AERO Inhale 2 puffs into the lungs 2 (two) times daily. 11/13/22   Hunsucker, Lesia Sago, MD  clonazePAM (KLONOPIN) 0.5 MG tablet Take 0.5 mg by mouth 2 (two) times daily as needed. 01/14/22   [provider]   fluticasone (FLONASE) 50 MCG/ACT nasal spray Place 1 spray into both nostrils daily.    [provider]  furosemide (LASIX) 20 MG tablet Take 1 tablet (20 mg total) by mouth daily. 05/05/22   Hunsucker, Lesia Sago, MD  ibuprofen (ADVIL,MOTRIN) 600 MG tablet Take 1 tablet (600 mg total) by mouth every 6 (six) hours as needed. 04/17/18   Domenick Gong, MD  ipratropium (ATROVENT) 0.06 % nasal spray Place 2 sprays into both nostrils 4 (four) times daily. 03/12/22   Becky Augusta, NP  LINZESS 145 MCG CAPS capsule Take 145 mcg by mouth daily as needed. 04/03/22   [provider]  nystatin (MYCOSTATIN) 100000 UNIT/ML suspension Take 5 mLs (500,000 Units total) by mouth 4 (four) times daily. For 7 days, then prn recurrance 12/14/22   Rodriguez-Southworth, Nettie Elm, PA-C  ondansetron (ZOFRAN-ODT) 4 MG disintegrating tablet Take 1 tablet (4 mg total) by mouth every 8 (eight) hours as needed for nausea or vomiting. 02/12/23   Varney Daily, PA  oxyCODONE (OXYCONTIN) 20 mg 12 hr tablet Take 30 mg by mouth every 12 (twelve) hours.     [provider]  PANCREAZE 40981 units CPEP TAKE ONE CAPSULE BY MOUTH 3 TIMES A DAY 02/01/18   [provider]  pantoprazole (PROTONIX) 40 MG tablet Take 40  mg by mouth daily.    [provider]  predniSONE (DELTASONE) 20 MG tablet TAKE 1 TABLET BY MOUTH EVERY DAY WITH BREAKFAST 05/20/23   Hunsucker, Lesia Sago, MD  promethazine-dextromethorphan (PROMETHAZINE-DM) 6.25-15 MG/5ML syrup Take 5 mLs by mouth at bedtime as needed. 04/16/23   Brimage, Seward Meth, DO  Tezepelumab-ekko (TEZSPIRE) 210 MG/1. SOAJ Inject 210 mg into the skin every 28 (twenty-eight) days. Courier to pulm: [address], [address], [place] [address]  Appt on 11/12/22 11/06/22   Hunsucker, Lesia Sago, MD  VENTOLIN HFA 108 (90 Base) MCG/ACT inhaler 2 puffs every 4 (four) hours as needed. Last taken: 1015am. Today. 02/25/18   [provider]    Family  History Family History  Problem Relation Age of Onset   Lung cancer Mother    Pancreatic disease Father     Social History Social History   Tobacco Use   Smoking status: Former    Types: Cigarettes   Smokeless tobacco: Never  Vaping Use   Vaping Use: Former  Substance Use Topics   Alcohol use: Yes   Drug use: No     Allergies   Gabapentin, Zithromax [azithromycin], Levaquin [levofloxacin], and Penicillin g   Review of Systems Review of Systems  Constitutional:  Negative for fever.  Gastrointestinal:  Positive for abdominal distention, abdominal pain, diarrhea and nausea. Negative for vomiting.     Physical Exam Triage Vital Signs ED Triage Vitals  Enc Vitals Group     BP 05/28/23 0813 134/87     Pulse Rate 05/28/23 0813 (!) 110     Resp --      Temp 05/28/23 0813 99.4 F (37.4 C)     Temp Source 05/28/23 0813 Oral     SpO2 05/28/23 0813 90 %     Weight 05/28/23 0812 252 lb (114.3 kg)     Height 05/28/23 0812 6' (1.829 m)     Head Circumference --      Peak Flow --      Pain Score 05/28/23 0811 9     Pain Loc --      Pain Edu? --      Excl. in GC? --    No data found.  Updated Vital Signs BP 134/87 (BP Location: Left Arm)   Pulse (!) 110   Temp 99.4 F (37.4 C) (Oral)   Ht 6' (1.829 m)   Wt 252 lb (114.3 kg)   SpO2 90%   BMI 34.18 kg/m   Visual Acuity Right Eye Distance:   Left Eye Distance:   Bilateral Distance:    Right Eye Near:   Left Eye Near:    Bilateral Near:     Physical Exam Vitals and nursing note reviewed.  Constitutional:      Appearance: Normal appearance. He is not ill-appearing.  HENT:     Head: Normocephalic and atraumatic.  Cardiovascular:     Rate and Rhythm: Regular rhythm. Tachycardia present.     Pulses: Normal pulses.     Heart sounds: Normal heart sounds. No murmur heard.    No friction rub. No gallop.  Pulmonary:     Effort: Pulmonary effort is normal.     Breath sounds: Normal breath sounds. No wheezing,  rhonchi or rales.  Abdominal:     General: There is distension.     Tenderness: There is abdominal tenderness. There is no guarding or rebound.  Skin:    General: Skin is warm and dry.  Capillary Refill: Capillary refill takes less than 2 seconds.  Neurological:     General: No focal deficit present.     Mental Status: He is alert and oriented to person, place, and time.      UC Treatments / Results  Labs (all labs ordered are listed, but only abnormal results are displayed) Labs Reviewed  CBC WITH DIFFERENTIAL/PLATELET  COMPREHENSIVE METABOLIC PANEL  LIPASE, BLOOD    EKG   Radiology No results found.  Procedures Procedures (including critical care time)  Medications Ordered in UC Medications - No data to display  Initial Impression / Assessment and Plan / UC Course  I have reviewed the triage vital signs and the nursing notes.  Pertinent labs & imaging results that were available during my care of the patient were reviewed by me and considered in my medical decision making (see chart for details).   Patient is a pleasant 52 year old male who appears to be in a moderate degree of pain holding his abdomen in the exam room presenting for evaluation of 5 days worth of generalized abdominal pain and nausea with some associated episodes of diarrhea.  He states that this current episode started approximately an hour after he ate a frozen pizza.  He had a similar episode after eating the same pizza in February and was evaluated in the ER.  A CT scan was performed at that time that showed mild acute uncomplicated sigmoid colonic diverticulitis.  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  The pancreatic cyst was known the renal lesions are new.  He also had calcification of the pancreatic head suggestive of chronic pancreatitis.  Blood work at that time showed a normal lipase of 33, urinalysis that was unremarkable, CMP showed normal renal function and transaminases.   Sodium and potassium were also normal at that time.  Patient's WBC count was elevated at 15.8.  He was treated for diverticulitis with Augmentin.  Given patient's diffuse abdominal pain I am concerned for possible acute pancreatitis, diverticulitis, or inflammatory GI process.  I will order a CBC, CMP, and lipase.  I will also order a KUB of the abdomen to evaluate for possible constipation given patient's history of chronic constipation.  Patient is refusing imaging and blood work.  I advised the patient that I will not prescribe him antibiotics unless I know what I am treating.  He then got up and left the clinic.   Final Clinical Impressions(s) / UC Diagnoses   Final diagnoses:  Generalized abdominal pain     Discharge Instructions      Patient left without completing evaluation.     ED Prescriptions   None    PDMP not reviewed this encounter.   Becky Augusta, NP 05/28/23 [phone]

## 2023-05-28 NOTE — ED Notes (Signed)
Pt declined abdominal xray and blood work for CBC and CMP. Pt asked what the blood work is for and they why the doctor needs to see his cell count or blood levels. Pt states that he takes supplements to keep his potassium and magnesium up and does not need blood work. Pt stated that he is going through the same thing that he experienced 2 months ago. Pt states that he was stupid enough to eat the same thing twice and only needs an antibiotic to relieve his symptoms. I asked the patient where he went for his issues, Pt stated that he went to the [facility] 2 months ago for the same issue and repeated he only needs antibiotics and ondansetron. Pt asked me why the provider needs his bloodwork or an xray, and I informed him that the provider needs to check to see what is wrong as it may not be the same issue he experienced 2 months ago. Pt again declined bloodwork stating that it is the same thing and wanted me to inform the provider. Provider has been informed.

## 2023-05-28 NOTE — ED Triage Notes (Signed)
Pt c/o abdominal pain, nausea x5days  Pt states that he ate frozen pizzas from [employer] and states that they did not taste right. Pt ate the pizzas and states that he has been doubled up since eating them.

## 2023-06-12 ENCOUNTER — Encounter: Payer: Self-pay | Admitting: Pulmonary Disease

## 2023-06-20 ENCOUNTER — Other Ambulatory Visit: Payer: Self-pay

## 2023-06-20 ENCOUNTER — Emergency Department: Payer: Medicare Other

## 2023-06-20 ENCOUNTER — Emergency Department
Admission: EM | Admit: 2023-06-20 | Discharge: 2023-06-20 | Disposition: A | Discharge status: Home / Self Care | Payer: Medicare Other | Attending: Emergency Medicine | Admitting: Emergency Medicine

## 2023-06-20 DIAGNOSIS — R1084 Generalized abdominal pain: Secondary | ICD-10-CM | POA: Insufficient documentation

## 2023-06-20 DIAGNOSIS — R0602 Shortness of breath: Secondary | ICD-10-CM | POA: Diagnosis present

## 2023-06-20 DIAGNOSIS — J441 Chronic obstructive pulmonary disease with (acute) exacerbation: Secondary | ICD-10-CM | POA: Diagnosis not present

## 2023-06-20 DIAGNOSIS — F1029 Alcohol dependence with unspecified alcohol-induced disorder: Secondary | ICD-10-CM | POA: Diagnosis not present

## 2023-06-20 LAB — COMPREHENSIVE METABOLIC PANEL
ALT: 44 U/L (ref 0–44)
AST: 43 U/L — ABNORMAL HIGH (ref 15–41)
Albumin: 4.1 g/dL (ref 3.5–5.0)
Alkaline Phosphatase: 62 U/L (ref 38–126)
Anion gap: 15 (ref 5–15)
BUN: 24 mg/dL — ABNORMAL HIGH (ref 6–20)
CO2: 29 mmol/L (ref 22–32)
Calcium: 8.9 mg/dL (ref 8.9–10.3)
Chloride: 95 mmol/L — ABNORMAL LOW (ref 98–111)
Creatinine, Ser: 1.27 mg/dL — ABNORMAL HIGH (ref 0.61–1.24)
GFR, Estimated: >60 mL/min (ref >60)
Glucose, Bld: 149 mg/dL — ABNORMAL HIGH (ref 70–99)
Potassium: 3.9 mmol/L (ref 3.5–5.1)
Sodium: 139 mmol/L (ref 135–145)
Total Bilirubin: 0.7 mg/dL (ref 0.3–1.2)
Total Protein: 7.3 g/dL (ref 6.5–8.1)

## 2023-06-20 LAB — CBC
HCT: 48.5 % (ref 39.0–52.0)
Hemoglobin: 15.9 g/dL (ref 13.0–17.0)
MCH: 31.9 pg (ref 26.0–34.0)
MCHC: 32.8 g/dL (ref 30.0–36.0)
MCV: 97.2 fL (ref 80.0–100.0)
Platelets: 292 10*3/uL (ref 150–400)
RBC: 4.99 MIL/uL (ref 4.22–5.81)
RDW: 14.1 % (ref 11.5–15.5)
WBC: 16.9 10*3/uL — ABNORMAL HIGH (ref 4.0–10.5)
nRBC: 0 % (ref 0.0–0.2)

## 2023-06-20 LAB — LACTIC ACID, PLASMA: Lactic Acid, Venous: 1.6 mmol/L (ref 0.5–1.9)

## 2023-06-20 LAB — TROPONIN I (HIGH SENSITIVITY)
Troponin I (High Sensitivity): 17 ng/L (ref <18)
Troponin I (High Sensitivity): 18 ng/L — ABNORMAL HIGH (ref <18)

## 2023-06-20 LAB — BRAIN NATRIURETIC PEPTIDE: B Natriuretic Peptide: 55.1 pg/mL (ref 0.0–100.0)

## 2023-06-20 LAB — PROTIME-INR
INR: 1 (ref 0.8–1.2)
Prothrombin Time: 13 seconds (ref 11.4–15.2)

## 2023-06-20 LAB — LIPASE, BLOOD: Lipase: 42 U/L (ref 11–51)

## 2023-06-20 MED ORDER — LORAZEPAM 2 MG PO TABS: 0.0000 mg | ORAL_TABLET | Freq: Four times a day (QID) | ORAL | Status: DC

## 2023-06-20 MED ORDER — IPRATROPIUM-ALBUTEROL 0.5-2.5 (3) MG/3ML IN SOLN
RESPIRATORY_TRACT | Status: AC
  Filled 2023-06-20: qty 6

## 2023-06-20 MED ORDER — NYSTATIN 100000 UNIT/ML MT SUSP: 5.0000 mL | Freq: Four times a day (QID) | OROMUCOSAL | 0 refills | Status: DC

## 2023-06-20 MED ORDER — THIAMINE MONONITRATE 100 MG PO TABS: 100.0000 mg | ORAL_TABLET | Freq: Every day | ORAL | Status: DC

## 2023-06-20 MED ORDER — IPRATROPIUM-ALBUTEROL 0.5-2.5 (3) MG/3ML IN SOLN
3.0000 mL | Freq: Once | RESPIRATORY_TRACT | Status: AC
  Administered 2023-06-20: 3 mL via RESPIRATORY_TRACT
  Filled 2023-06-20: qty 3

## 2023-06-20 MED ORDER — SODIUM CHLORIDE 0.9 % IV BOLUS
1000.0000 mL | Freq: Once | INTRAVENOUS | Status: AC
  Administered 2023-06-20: 1000 mL via INTRAVENOUS

## 2023-06-20 MED ORDER — LORAZEPAM 2 MG PO TABS: 0.0000 mg | ORAL_TABLET | Freq: Two times a day (BID) | ORAL | Status: DC

## 2023-06-20 MED ORDER — THIAMINE HCL 100 MG/ML IJ SOLN
100.0000 mg | Freq: Every day | INTRAMUSCULAR | Status: DC
  Administered 2023-06-20: 100 mg via INTRAVENOUS
  Filled 2023-06-20: qty 2

## 2023-06-20 MED ORDER — LORAZEPAM 1 MG PO TABS: 1.0000 mg | ORAL_TABLET | Freq: Two times a day (BID) | ORAL | Status: DC

## 2023-06-20 MED ORDER — LORAZEPAM 1 MG PO TABS
1.0000 mg | ORAL_TABLET | Freq: Four times a day (QID) | ORAL | Status: DC
  Administered 2023-06-20: 1 mg via ORAL
  Filled 2023-06-20: qty 1

## 2023-06-20 MED ORDER — DOXYCYCLINE HYCLATE 100 MG PO TABS: 100.0000 mg | ORAL_TABLET | Freq: Two times a day (BID) | ORAL | 0 refills | Status: AC

## 2023-06-20 MED ORDER — PREDNISONE 20 MG PO TABS
40.0000 mg | ORAL_TABLET | Freq: Once | ORAL | Status: AC
  Administered 2023-06-20: 40 mg via ORAL
  Filled 2023-06-20: qty 2

## 2023-06-20 MED ORDER — SODIUM CHLORIDE 0.9 % IV SOLN
2.0000 g | Freq: Once | INTRAVENOUS | Status: AC
  Administered 2023-06-20: 2 g via INTRAVENOUS
  Filled 2023-06-20: qty 20

## 2023-06-20 MED ORDER — PREDNISONE 20 MG PO TABS: 40.0000 mg | ORAL_TABLET | Freq: Every day | ORAL | 0 refills | Status: AC

## 2023-06-20 MED ORDER — PANTOPRAZOLE SODIUM 40 MG IV SOLR
40.0000 mg | Freq: Once | INTRAVENOUS | Status: AC
  Administered 2023-06-20: 40 mg via INTRAVENOUS
  Filled 2023-06-20: qty 10

## 2023-06-20 MED ORDER — METOCLOPRAMIDE HCL 5 MG/ML IJ SOLN
10.0000 mg | INTRAMUSCULAR | Status: AC
  Administered 2023-06-20: 10 mg via INTRAVENOUS
  Filled 2023-06-20: qty 2

## 2023-06-20 MED ORDER — SODIUM CHLORIDE 0.9 % IV BOLUS
500.0000 mL | Freq: Once | INTRAVENOUS | Status: AC
  Administered 2023-06-20: 500 mL via INTRAVENOUS

## 2023-06-20 MED ORDER — IOHEXOL 350 MG/ML SOLN
100.0000 mL | Freq: Once | INTRAVENOUS | Status: AC | PRN
  Administered 2023-06-20: 100 mL via INTRAVENOUS

## 2023-06-20 NOTE — ED Notes (Signed)
Pt uncomfortable and diaphoretic. Repositioned to recliner.

## 2023-06-20 NOTE — ED Provider Notes (Signed)
CT imaging reviewed with patient.  No sign of PE or aspiration or pneumonia.  His heart rate has significantly improved proved.  He admits to drinking a 12 pack of beer last night and states he gets short of breath with this level of drinking.  He is interested in detox but does not want to be admitted to the hospital currently.  Does not appear to be in acute withdrawal does have history of anxiety.  On exam patient does have faint expiratory wheezing and is fairly tight is concern for COPD exacerbation.  Will give nebs as well as prednisone and reassess.  ----------------------------------------- 10:51 AM on 06/20/2023 ----------------------------------------- Patient reassessed.  He is still persistently tachycardic.  He is not in any respiratory distress.  Again, I recommended he be admitted to the hospital for further evaluation and management.  He is adamantly opposed to being admitted to the hospital.  He does appear to have decision-making capacity and understands the the rationale for my recommendation that he would be leaving AGAINST MEDICAL ADVICE.  Understands that his COPD could worsen but he has nebulizers at home as well as prednisone.  He does not appear septic.  Does not appear to be in acute withdrawal.  Will give referrals.  We discussed tricked return precautions.   Willy Eddy, MD 06/20/23 1052

## 2023-06-20 NOTE — ED Provider Notes (Signed)
[facility] Provider Note    Event Date/Time   First MD Initiated Contact with Patient 06/20/23 [phone]     (approximate)   History   Chief Complaint: Shortness of Breath and Abdominal Pain   HPI  Eugene Blackburn is a 52 y.o. male with a past history of pancreatic insufficiency, COPD, pancreatitis who comes ED complaining of shortness of breath and upper abdominal pain which has been going on for several months and gradually worsening.  He reports over the last 2 months he started drinking beer heavily every day to help with the symptoms, but relief is only been temporary.  Denies fever or cough.  Denies chest pain.  No black or bloody stool or diarrhea.  No vomiting     Physical Exam   Triage Vital Signs: ED Triage Vitals  Enc Vitals Group     BP 06/20/23 0603 (!) 130/116     Pulse Rate 06/20/23 0603 (!) 144     Resp 06/20/23 0603 (!) 22     Temp 06/20/23 0603 97.6 F (36.4 C)     Temp Source 06/20/23 0603 Oral     SpO2 06/20/23 0603 94 %     Weight 06/20/23 0601 252 lb (114.3 kg)     Height 06/20/23 0601 6' (1.829 m)     Head Circumference --      Peak Flow --      Pain Score 06/20/23 0601 8     Pain Loc --      Pain Edu? --      Excl. in GC? --     Most recent vital signs: Vitals:   06/20/23 0603  BP: (!) 130/116  Pulse: (!) 144  Resp: (!) 22  Temp: 97.6 F (36.4 C)  SpO2: 94%    General: Awake, no distress.  CV:  Good peripheral perfusion.  Tachycardia heart rate 130.  Symmetric distal pulses Resp:  Normal effort.  Clear to auscultation bilaterally.  No wheezing Abd:  No distention.  Soft with diffuse left-sided tenderness. Other:  Dry mucous membranes.   ED Results / Procedures / Treatments   Labs (all labs ordered are listed, but only abnormal results are displayed) Labs Reviewed  CBC - Abnormal; Notable for the following components:      Result Value   WBC 16.9 (*)    All other components within normal limits   COMPREHENSIVE METABOLIC PANEL - Abnormal; Notable for the following components:   Chloride 95 (*)    Glucose, Bld 149 (*)    BUN 24 (*)    Creatinine, Ser 1.27 (*)    AST 43 (*)    All other components within normal limits  CULTURE, BLOOD (ROUTINE X 2)  CULTURE, BLOOD (ROUTINE X 2)  BRAIN NATRIURETIC PEPTIDE  LIPASE, BLOOD  LACTIC ACID, PLASMA  LACTIC ACID, PLASMA  PROTIME-INR  URINALYSIS, W/ REFLEX TO CULTURE (INFECTION SUSPECTED)  TROPONIN I (HIGH SENSITIVITY)     EKG Interpreted by me Sinus tachycardia rate 136.  Normal axis, normal intervals.  Normal QRS ST segments and T waves   RADIOLOGY Chest x-ray interpreted by me, appears normal.  Radiology report reviewed   PROCEDURES:  .Critical Care  Performed by: Sharman Cheek, MD Authorized by: Sharman Cheek, MD   Critical care provider statement:    Critical care time (minutes):  35   Critical care time was exclusive of:  Separately billable procedures and treating other patients   Critical care was necessary to  treat or prevent imminent or life-threatening deterioration of the following conditions:  Sepsis   Critical care was time spent personally by me on the following activities:  Development of treatment plan with patient or surrogate, discussions with consultants, evaluation of patient's response to treatment, examination of patient, obtaining history from patient or surrogate, ordering and performing treatments and interventions, ordering and review of laboratory studies, ordering and review of radiographic studies, pulse oximetry, re-evaluation of patient's condition and review of old charts    MEDICATIONS ORDERED IN ED: Medications  cefTRIAXone (ROCEPHIN) 2 g in sodium chloride 0.9 % 100 mL IVPB (has no administration in time range)  pantoprazole (PROTONIX) injection 40 mg (40 mg Intravenous Given 06/20/23 0631)  sodium chloride 0.9 % bolus 1,000 mL (1,000 mLs Intravenous New Bag/Given 06/20/23 0631)   metoCLOPramide (REGLAN) injection 10 mg (10 mg Intravenous Given 06/20/23 1610)     IMPRESSION / MDM / ASSESSMENT AND PLAN / ED COURSE  I reviewed the triage vital signs and the nursing notes.  DDx: Pneumonia, pleural effusion, pulmonary edema, pneumothorax, pulmonary embolism, diverticulitis, bowel perforation, GERD, peptic ulcer disease, pancreatitis  Patient's presentation is most consistent with acute presentation with potential threat to life or bodily function.  Patient presents with progressive shortness of breath.  Doubt ACS or dissection or pericardial effusion.  X-ray nondiagnostic.  Will obtain CT angiogram of the chest.  Also has abdominal pain and tenderness.  With his abnormal vital signs and exam, will obtain CT abdomen and pelvis. Will obtain sepsis workup and give empiric ceftriaxone.       FINAL CLINICAL IMPRESSION(S) / ED DIAGNOSES   Final diagnoses:  Generalized abdominal pain     Rx / DC Orders   ED Discharge Orders     None        Note:  This document was prepared using Dragon voice recognition software and may include unintentional dictation errors.   Sharman Cheek, MD 06/20/23 [phone]

## 2023-06-20 NOTE — ED Notes (Signed)
Pt back from CT, reconnected to monitor. Pt appears slightly less dyspneic. Pt was placed back on 2L oxygen in CT for comfort because was not tolerating lying flat.

## 2023-06-20 NOTE — ED Triage Notes (Signed)
SOB and abd pain "for quite awhile" worse today. Reports concern for dehydration d/t lasix. Reports hx of gastric ulcers. Pt breathing labored and speaking in short sentences. Pt alert and oriented.

## 2023-06-20 NOTE — ED Notes (Signed)
Pt daily drinker. 12 beers daily. Last drink at 12a this morning. [place] Preformed

## 2023-06-20 NOTE — ED Notes (Signed)
Pt to CT. Pt removed oxygen and had BM.

## 2023-06-20 NOTE — ED Notes (Signed)
EDP spoke with pt several times and pt declines hospital admission.

## 2023-06-20 NOTE — ED Notes (Addendum)
2nd troponin collected at this time.  

## 2023-06-21 LAB — CULTURE, BLOOD (ROUTINE X 2): Culture: NO GROWTH

## 2023-06-22 ENCOUNTER — Other Ambulatory Visit: Payer: Self-pay | Admitting: Pulmonary Disease

## 2023-06-22 LAB — CULTURE, BLOOD (ROUTINE X 2)

## 2023-06-23 LAB — CULTURE, BLOOD (ROUTINE X 2): Culture: NO GROWTH

## 2023-06-24 ENCOUNTER — Ambulatory Visit
Admission: EM | Admit: 2023-06-24 | Discharge: 2023-06-24 | Disposition: A | Discharge status: Home / Self Care | Payer: Medicare Other | Attending: Emergency Medicine | Admitting: Emergency Medicine

## 2023-06-24 ENCOUNTER — Encounter: Payer: Self-pay | Admitting: Emergency Medicine

## 2023-06-24 DIAGNOSIS — R1013 Epigastric pain: Secondary | ICD-10-CM

## 2023-06-24 LAB — CULTURE, BLOOD (ROUTINE X 2)

## 2023-06-24 MED ORDER — ALUMINUM-MAGNESIUM-SIMETHICONE 200-200-20 MG/5ML PO SUSP: 30.0000 mL | Freq: Three times a day (TID) | ORAL | 1 refills | Status: AC

## 2023-06-24 MED ORDER — ONDANSETRON 4 MG PO TBDP: 4.0000 mg | ORAL_TABLET | Freq: Three times a day (TID) | ORAL | 0 refills | Status: AC | PRN

## 2023-06-24 MED ORDER — FAMOTIDINE 20 MG PO TABS: 20.0000 mg | ORAL_TABLET | Freq: Two times a day (BID) | ORAL | 0 refills | Status: AC

## 2023-06-24 MED ORDER — AMOXICILLIN 500 MG PO CAPS: 500.0000 mg | ORAL_CAPSULE | Freq: Three times a day (TID) | ORAL | 0 refills | Status: DC

## 2023-06-24 NOTE — ED Triage Notes (Signed)
Pt presents with abdominal pain that he believes is related to an ulcer, back pain, SOB and acid reflux x 3 week.s

## 2023-06-24 NOTE — ED Provider Notes (Signed)
[facility]    CSN: [id number] Arrival date & time: 06/24/23  1449      History   Chief Complaint Chief Complaint  Patient presents with   Abdominal Pain   Gastroesophageal Reflux   Shortness of Breath    HPI Eugene Blackburn is a 52 y.o. male.   Patient presents for evaluation of upper abdominal pain present for 3 weeks.  Pain is constant but fluctuates in intensity, rated 8 out of 10, described as pressure and burning.  Endorses that he has a sensation of acid reflux and heartburn that has begun to radiate into the back and cause episodes of shortness of breath.  Believes symptoms began after eating a salad that had tomatoes which is a known trigger for patient.  Associated nausea but denies vomiting.  Decreased appetite but able to eat a bland diet, tolerating fluids.  No known sick contacts, denies fever or URI symptoms.  Evaluated 5 days ago in local emergency department for abdominal pain, endorses that he only went because he had drank a case of beer and felt that symptoms could be related to his pancreas, endorses this causes ruled out and he has not had alcohol since.  Complete workup done, concern for COPD flare therefore started on doxycycline and prednisone, endorses no improvement in symptoms.  Endorses that approximately 10 years ago he had the exact same symptoms in which she was treated with amoxicillin and symptoms began to subside after 3 days.  History of GERD, taking pantoprazole daily.  History of EtOH abuse, COPD, chronic pancreatitis, constipation.        Past Medical History:  Diagnosis Date   Asthma    Chronic idiopathic constipation    Chronic obstruct airways disease (HCC)    Chronic pancreatitis (HCC)    Degenerative disorder of muscle    Hearing loss    Hemorrhoids    Hyperlipidemia    Insomnia    Male erectile dysfunction    Nausea    Pancreas (digestive gland) works poorly    Seasonal allergic rhinitis    Vitamin D deficiency      Patient Active Problem List   Diagnosis Date Noted   Tremor 01/26/2019   Numbness and tingling of both legs 01/26/2019    Past Surgical History:  Procedure Laterality Date   PANCREAS SURGERY         Home Medications    Prior to Admission medications   Medication Sig Start Date End Date Taking? Authorizing Provider  ADVAIR HFA 230-21 MCG/ACT inhaler INHALE 2 PUFFS INTO THE LUNGS TWICE A DAY Patient taking differently: Inhale 2 puffs into the lungs 2 (two) times daily. 02/25/23   Hunsucker, Lesia Sago, MD  clonazePAM (KLONOPIN) 0.5 MG tablet Take 0.5 mg by mouth 2 (two) times daily as needed. 01/14/22   [provider]  doxycycline (VIBRA-TABS) 100 MG tablet Take 1 tablet (100 mg total) by mouth 2 (two) times daily for 7 days. 06/20/23 06/27/23  Willy Eddy, MD  fluticasone (FLONASE) 50 MCG/ACT nasal spray Place 1 spray into both nostrils daily. Patient not taking: Reported on 06/20/2023    [provider]  furosemide (LASIX) 40 MG tablet Take 40 mg by mouth 2 (two) times daily. 05/02/23   [provider]  ibuprofen (ADVIL,MOTRIN) 600 MG tablet Take 1 tablet (600 mg total) by mouth every 6 (six) hours as needed. Patient not taking: Reported on 06/20/2023 04/17/18   Domenick Gong, MD  ipratropium (ATROVENT) 0.06 % nasal  spray Place 2 sprays into both nostrils 4 (four) times daily. Patient not taking: Reported on 06/20/2023 03/12/22   Becky Augusta, NP  KLOR-CON M20 20 MEQ tablet Take 20 mEq by mouth daily. 04/17/23   [provider]  LINZESS 290 MCG CAPS capsule Take 290 mcg by mouth daily. 04/23/23   [provider]  nystatin (MYCOSTATIN) 100000 UNIT/ML suspension Take 5 mLs (500,000 Units total) by mouth 4 (four) times daily. For 7 days, then prn recurrance 06/20/23   Willy Eddy, MD  ondansetron (ZOFRAN) 4 MG tablet Take 4 mg by mouth every 12 (twelve) hours. 05/06/23   [provider]  oxyCODONE (OXYCONTIN) 20 mg 12 hr tablet  Take 20 mg by mouth every 12 (twelve) hours.    [provider]  PANCREAZE 46962 units CPEP Take 1 capsule by mouth 3 (three) times daily. 02/01/18   [provider]  pantoprazole (PROTONIX) 40 MG tablet Take 40 mg by mouth daily.    [provider]  predniSONE (DELTASONE) 20 MG tablet Take 2 tablets (40 mg total) by mouth daily for 5 days. 06/20/23 06/25/23  Willy Eddy, MD  predniSONE (DELTASONE) 20 MG tablet TAKE 1 TABLET BY MOUTH EVERY DAY WITH BREAKFAST 06/23/23   Hunsucker, Lesia Sago, MD  promethazine-dextromethorphan (PROMETHAZINE-DM) 6.25-15 MG/5ML syrup Take 5 mLs by mouth at bedtime as needed. 04/16/23   Brimage, Seward Meth, DO  Tezepelumab-ekko (TEZSPIRE) 210 MG/1. SOAJ Inject 210 mg into the skin every 28 (twenty-eight) days. Courier to pulm: [address], [address], [place] [address]  Appt on 11/12/22 11/06/22   Hunsucker, Lesia Sago, MD  VENTOLIN HFA 108 (90 Base) MCG/ACT inhaler 2 puffs every 4 (four) hours as needed. Last taken: 1015am. Today. 02/25/18   [provider]    Family History Family History  Problem Relation Age of Onset   Lung cancer Mother    Pancreatic disease Father     Social History Social History   Tobacco Use   Smoking status: Former    Types: Cigarettes   Smokeless tobacco: Never  Vaping Use   Vaping Use: Former  Substance Use Topics   Alcohol use: Yes   Drug use: No     Allergies   Gabapentin, Zithromax [azithromycin], Levaquin [levofloxacin], and Penicillin g   Review of Systems Review of Systems Defer top HPI    Physical Exam Triage Vital Signs ED Triage Vitals  Enc Vitals Group     BP 06/24/23 1504 132/86     Pulse Rate 06/24/23 1504 (!) 115     Resp 06/24/23 1504 18     Temp 06/24/23 1504 98.2 F (36.8 C)     Temp Source 06/24/23 1504 Oral     SpO2 06/24/23 1504 95 %     Weight --      Height --      Head Circumference --      Peak Flow --      Pain Score 06/24/23 1503 8     Pain  Loc --      Pain Edu? --      Excl. in GC? --    No data found.  Updated Vital Signs BP 132/86 (BP Location: Left Arm)   Pulse (!) 115   Temp 98.2 F (36.8 C) (Oral)   Resp 18   SpO2 95%   Visual Acuity Right Eye Distance:   Left Eye Distance:   Bilateral Distance:    Right Eye Near:   Left Eye  Near:    Bilateral Near:     Physical Exam Constitutional:      Appearance: Normal appearance. He is well-developed.  Eyes:     Extraocular Movements: Extraocular movements intact.  Cardiovascular:     Rate and Rhythm: Normal rate and regular rhythm.     Pulses: Normal pulses.     Heart sounds: Normal heart sounds.  Pulmonary:     Effort: Pulmonary effort is normal.     Breath sounds: Normal breath sounds.  Abdominal:     General: Bowel sounds are normal. There is distension.     Palpations: Abdomen is soft.     Tenderness: There is abdominal tenderness in the epigastric area.  Neurological:     Mental Status: He is alert and oriented to person, place, and time. Mental status is at baseline.      UC Treatments / Results  Labs (all labs ordered are listed, but only abnormal results are displayed) Labs Reviewed - No data to display  EKG   Radiology No results found.  Procedures Procedures (including critical care time)  Medications Ordered in UC Medications - No data to display  Initial Impression / Assessment and Plan / UC Course  I have reviewed the triage vital signs and the nursing notes.  Pertinent labs & imaging results that were available during my care of the patient were reviewed by me and considered in my medical decision making (see chart for details).  Epigastric abdominal pain  Vital signs are stable, tachycardia noted at 115, per chart review over the last 1 to 2 months patient has been tachycardic at baseline, lungs are clear to auscultation and O2 saturation 95% on room air, low suspicion for pneumonia been taking antibiotic as directed, CT  chest and chest x-ray -5 days ago, will defer today, unable to complete abdominal ultrasound as it is not available, CT of the abdomen 5 days ago negative for acute process and patient is being evaluated for same symptoms, gastric and kidney mass noted, patient endorses he was unable to get follow-up appointment with GI within this week, currently stable, discontinue doxycycline prescribed amoxicillin as patient endorses success with this medicine in the past, advised continued use of PPI and prescribed famotidine, Maalox and Zofran for additional support, given strict precautions that if symptoms persist past medication or worsen at any point he is to go to the nearest emergency department for reevaluation and further management and patient is to follow-up with GI if symptoms improve Final Clinical Impressions(s) / UC Diagnoses   Final diagnoses:  None   Discharge Instructions   None    ED Prescriptions   None    PDMP not reviewed this encounter.   Valinda Hoar, NP 06/24/23 [phone]

## 2023-06-24 NOTE — Discharge Instructions (Addendum)
Today you are evaluated for your abdominal pain  Stop use of doxycycline begin amoxicillin taking every 8 hours for 7 days  Continue use of pantoprazole  Begin famotidine every morning and every evening for additional coverage to reduce stomach acid  You may use Maalox every 6 hours, ideally take 30 minutes before meals, this is a mixture medicine that helps to reduce gas  For nausea you may use Zofran every 8 hours as needed  Continue to eat a bland diet with increase fluid intake to maintain your hydration  If your symptoms continue to persist past use of medicine please go back to the nearest emergency department for further evaluation management  Please schedule follow-up appointment with GI doctor, take first available  At any point if your symptoms worsen please go back to the nearest emergency department for reevaluation

## 2023-06-25 LAB — CULTURE, BLOOD (ROUTINE X 2)

## 2023-07-13 ENCOUNTER — Ambulatory Visit: Payer: Medicare Other | Admitting: Pulmonary Disease

## 2023-07-20 ENCOUNTER — Ambulatory Visit: Payer: Medicare Other | Admitting: Primary Care

## 2023-07-20 ENCOUNTER — Encounter: Payer: Self-pay | Admitting: Primary Care

## 2023-07-20 VITALS — BP 120/68 | HR 125 | Temp 98.4°F | Ht 72.0 in | Wt 249.0 lb

## 2023-07-20 DIAGNOSIS — J4489 Other specified chronic obstructive pulmonary disease: Secondary | ICD-10-CM | POA: Diagnosis not present

## 2023-07-20 DIAGNOSIS — J439 Emphysema, unspecified: Secondary | ICD-10-CM

## 2023-07-20 MED ORDER — PREDNISONE 20 MG PO TABS: ORAL_TABLET | ORAL | 2 refills | Status: DC

## 2023-07-20 MED ORDER — ALBUTEROL SULFATE HFA 108 (90 BASE) MCG/ACT IN AERS: 2.0000 | INHALATION_SPRAY | RESPIRATORY_TRACT | 6 refills | Status: AC | PRN

## 2023-07-20 MED ORDER — BREZTRI AEROSPHERE 160-9-4.8 MCG/ACT IN AERO: 2.0000 | INHALATION_SPRAY | Freq: Two times a day (BID) | RESPIRATORY_TRACT | Status: AC

## 2023-07-20 MED ORDER — BREZTRI AEROSPHERE 160-9-4.8 MCG/ACT IN AERO: 2.0000 | INHALATION_SPRAY | Freq: Two times a day (BID) | RESPIRATORY_TRACT | 5 refills | Status: AC

## 2023-07-20 NOTE — Patient Instructions (Addendum)
Recommendations: Stop Advair Stay on Breztri - take two puffs morning and evening Use Albuterol every 4-6 hours for breakthrough shortness of breath/wheezing Continue prednisone 10mg  twice daily If you do not hear form gastroenterology today I would recommend going to UC/ED regarding stomach pain Recommend bowl rest/ stay on clear liquids or BRAT diet (Bananas, rice, applesauce, toast)  Rx: Refill Albuterol and prednisone Refill Breztri (sample provide and prescription sent)   Follow-up: 3-4 months with Dr. Judeth Horn or sooner if needed

## 2023-07-20 NOTE — Progress Notes (Signed)
@Patient  ID: Eugene Blackburn, male    DOB: 03-06-1971, 52 y.o.   MRN: [id number]  Chief Complaint  Patient presents with   Follow-up    Post COVID 2 weeks ago.  Pancreatitis flare.  Breathing doing well.    Referring provider: Armando Gang, FNP  HPI: 52 year old male, former smoker.  Past medical history significant for persistent asthma, pancreatitis, COVID-19.   07/20/2023 Patient presents today for overdue follow-up for asthma. Patient has a history of COVID-pneumonia, chronic bronchitis and emphysema. He recently had COVID 2 weeks ago, treated symptomatically with rest/fluids. Tested negative on Saturday 07/17/23. During his last office visit in October it was recommended he be started on Tezspire, however, patient elected not to start medication. Respiratory wise he is doing alright. Feels breathing is actually better. He has been taking both Breztri and Adviar. He is on chronic prednisone 20mg  daily, feels this works well for him. Unable to wean dose. He is not intersted in biologic injections. Needs refill Albuterol   He is currently having a lot of stomach pain. He has reach out to his GI doctor and is waiting for a call back to schedule an appointment. His last BM was 07/20/23 which was loose. He did not want labs today to check pancreas.    Allergies  Allergen Reactions   Gabapentin Shortness Of Breath   Zithromax [Azithromycin] Rash    Patient states azithromycin caused him to "bleed"   Levaquin [Levofloxacin] Other (See Comments)    Tendon problem    Immunization History  Administered Date(s) Administered   Moderna Sars-Covid-2 Vaccination 03/15/2020, 04/13/2020, 08/19/2020, 04/11/2021    Past Medical History:  Diagnosis Date   Asthma    Chronic idiopathic constipation    Chronic obstruct airways disease (HCC)    Chronic pancreatitis (HCC)    Degenerative disorder of muscle    Hearing loss    Hemorrhoids    Hyperlipidemia    Insomnia    Male erectile  dysfunction    Nausea    Pancreas (digestive gland) works poorly    Seasonal allergic rhinitis    Vitamin D deficiency     Tobacco History: Social History   Tobacco Use  Smoking Status Former   Types: Cigarettes  Smokeless Tobacco Never  Tobacco Comments   Patient does not know what year he quit smoking.   Counseling given: Not Answered Tobacco comments: Patient does not know what year he quit smoking.   Outpatient Medications Prior to Visit  Medication Sig Dispense Refill   aluminum-magnesium hydroxide-simethicone (MAALOX) 200-200-20 MG/5ML SUSP Take 30 mLs by mouth 4 (four) times daily -  before meals and at bedtime. 1680 mL 1   clonazePAM (KLONOPIN) 0.5 MG tablet Take 0.5 mg by mouth 2 (two) times daily as needed.     famotidine (PEPCID) 20 MG tablet Take 1 tablet (20 mg total) by mouth 2 (two) times daily. 60 tablet 0   fluticasone (FLONASE) 50 MCG/ACT nasal spray Place 1 spray into both nostrils daily.     furosemide (LASIX) 40 MG tablet Take 40 mg by mouth 2 (two) times daily.     ibuprofen (ADVIL,MOTRIN) 600 MG tablet Take 1 tablet (600 mg total) by mouth every 6 (six) hours as needed. 30 tablet 0   ipratropium (ATROVENT) 0.06 % nasal spray Place 2 sprays into both nostrils 4 (four) times daily. 15 mL 12   KLOR-CON M20 20 MEQ tablet Take 20 mEq by mouth daily.     LINZESS 290  MCG CAPS capsule Take 290 mcg by mouth daily.     nystatin (MYCOSTATIN) 100000 UNIT/ML suspension Take 5 mLs (500,000 Units total) by mouth 4 (four) times daily. For 7 days, then prn recurrance 473 mL 0   ondansetron (ZOFRAN-ODT) 4 MG disintegrating tablet Take 1 tablet (4 mg total) by mouth every 8 (eight) hours as needed for nausea or vomiting. 20 tablet 0   oxyCODONE (OXYCONTIN) 20 mg 12 hr tablet Take 20 mg by mouth every 12 (twelve) hours.     PANCREAZE 16109 units CPEP Take 1 capsule by mouth 3 (three) times daily.  5   pantoprazole (PROTONIX) 40 MG tablet Take 40 mg by mouth daily.      promethazine-dextromethorphan (PROMETHAZINE-DM) 6.25-15 MG/5ML syrup Take 5 mLs by mouth at bedtime as needed. 118 mL 0   ADVAIR HFA 230-21 MCG/ACT inhaler INHALE 2 PUFFS INTO THE LUNGS TWICE A DAY (Patient taking differently: Inhale 2 puffs into the lungs 2 (two) times daily.) 12 each 12   predniSONE (DELTASONE) 20 MG tablet TAKE 1 TABLET BY MOUTH EVERY DAY WITH BREAKFAST (Patient taking differently: Take 10 mg by mouth 2 (two) times daily with a meal.) 30 tablet 0   VENTOLIN HFA 108 (90 Base) MCG/ACT inhaler 2 puffs every 4 (four) hours as needed. Last taken: 1015am. Today.  5   amoxicillin (AMOXIL) 500 MG capsule Take 1 capsule (500 mg total) by mouth 3 (three) times daily. (Patient not taking: Reported on 07/20/2023) 21 capsule 0   ondansetron (ZOFRAN) 4 MG tablet Take 4 mg by mouth every 12 (twelve) hours. (Patient not taking: Reported on 07/20/2023)     Tezepelumab-ekko (TEZSPIRE) 210 MG/1. SOAJ Inject 210 mg into the skin every 28 (twenty-eight) days. Courier to pulm: [address], [address], [place] [address]  Appt on 11/12/22 (Patient not taking: Reported on 07/20/2023) 1.91 mL 0   No facility-administered medications prior to visit.    Review of Systems  Review of Systems  Constitutional: Negative.   Respiratory:  Negative for cough, shortness of breath and wheezing.   Gastrointestinal:  Positive for abdominal pain.   Physical Exam  BP 120/68 (BP Location: Left Arm, Patient Position: Sitting, Cuff Size: Large)   Pulse (!) 125   Temp 98.4 F (36.9 C) (Oral)   Ht 6' (1.829 m)   Wt 249 lb (112.9 kg)   SpO2 95%   BMI 33.77 kg/m  Physical Exam Constitutional:      Appearance: Normal appearance.  HENT:     Head: Normocephalic and atraumatic.  Cardiovascular:     Rate and Rhythm: Normal rate and regular rhythm.  Pulmonary:     Effort: Pulmonary effort is normal.     Breath sounds: Normal breath sounds. No wheezing or rhonchi.     Comments: CTA Musculoskeletal:         General: Normal range of motion.  Skin:    General: Skin is warm and dry.  Neurological:     General: No focal deficit present.     Mental Status: He is alert and oriented to person, place, and time. Mental status is at baseline.  Psychiatric:        Mood and Affect: Mood normal.        Behavior: Behavior normal.        Thought Content: Thought content normal.        Judgment: Judgment normal.      Lab Results:  CBC    Component Value Date/Time  WBC 16.9 (H) 06/20/2023 0606   RBC 4.99 06/20/2023 0606   HGB 15.9 06/20/2023 0606   HGB 15.6 09/25/2013 1641   HCT 48.5 06/20/2023 0606   HCT 47.6 09/25/2013 1641   PLT 292 06/20/2023 0606   PLT 264 09/25/2013 1641   MCV 97.2 06/20/2023 0606   MCV 89 09/25/2013 1641   MCH 31.9 06/20/2023 0606   MCHC 32.8 06/20/2023 0606   RDW 14.1 06/20/2023 0606   RDW 13.9 09/25/2013 1641   LYMPHSABS 1.5 02/12/2023 0907   LYMPHSABS 2.6 09/25/2013 1641   MONOABS 1.1 (H) 02/12/2023 0907   MONOABS 0.6 09/25/2013 1641   EOSABS 0.1 02/12/2023 0907   EOSABS 0.2 09/25/2013 1641   BASOSABS 0.1 02/12/2023 0907   BASOSABS 0.1 09/25/2013 1641    BMET    Component Value Date/Time   NA 139 06/20/2023 0606   K 3.9 06/20/2023 0606   CL 95 (L) 06/20/2023 0606   CO2 29 06/20/2023 0606   GLUCOSE 149 (H) 06/20/2023 0606   BUN 24 (H) 06/20/2023 0606   CREATININE 1.27 (H) 06/20/2023 0606   CALCIUM 8.9 06/20/2023 0606   GFRNONAA >60 06/20/2023 0606   GFRAA >60 03/10/2020 1101    BNP    Component Value Date/Time   BNP 55.1 06/20/2023 0606    ProBNP No results found for: "PROBNP"  Imaging: No results found.   Assessment & Plan:   COPD with chronic bronchitis and emphysema (HCC) - Stable; Breathing has improved, however, patient was using both Advair and Breztri - I have advised he stop Advair and continue Breztri - take two puffs morning and evening - Use Albuterol every 4-6 hours for breakthrough shortness of breath/wheezing -  Continue prednisone 10mg  twice daily  Abdominal pain Patient having active abdominal pain, does not feel related to pancreatitis.  Offered lab work however patient declined.  He is reach out to his gastroenterologist and is waiting for callback from them to schedule an appointment.  Recommended patient go to care or ED if stomach pain persists.  Recommend patient stay on bowel rest with clear liquids or brat diet  Recommendations: Stop Advair Stay on Breztri - take two puffs morning and evening Use Albuterol every 4-6 hours for breakthrough shortness of breath/wheezing Continue prednisone 10mg  twice daily If you do not hear form gastroenterology today I would recommend going to UC/ED regarding stomach pain Recommend bowl rest/ stay on clear liquids or BRAT diet (Bananas, rice, applesauce, toast)  Rx: Refill Albuterol and prednisone Refill Breztri (sample provide and prescription sent)   Follow-up: 3-4 months with Dr. Judeth Horn or sooner if needed    Glenford Bayley, NP 07/26/2023

## 2023-07-26 DIAGNOSIS — J439 Emphysema, unspecified: Secondary | ICD-10-CM | POA: Insufficient documentation

## 2023-07-26 NOTE — Assessment & Plan Note (Signed)
Patient having active abdominal pain, does not feel related to pancreatitis.  Offered lab work however patient declined.  He is reach out to his gastroenterologist and is waiting for callback from them to schedule an appointment.  Recommended patient go to care or ED if stomach pain persists.  Recommend patient stay on bowel rest with clear liquids or brat diet

## 2023-07-26 NOTE — Assessment & Plan Note (Addendum)
-   Stable; Breathing has improved, however, patient was using both Advair and Breztri - I have advised he stop Advair and continue Breztri - take two puffs morning and evening - Use Albuterol every 4-6 hours for breakthrough shortness of breath/wheezing - Continue prednisone 10mg  twice daily

## 2023-07-27 ENCOUNTER — Ambulatory Visit: Payer: Medicare Other | Admitting: Pulmonary Disease

## 2023-07-31 ENCOUNTER — Encounter: Payer: Self-pay | Admitting: Emergency Medicine

## 2023-07-31 ENCOUNTER — Ambulatory Visit: Payer: Medicare Other

## 2023-07-31 ENCOUNTER — Ambulatory Visit
Admission: EM | Admit: 2023-07-31 | Discharge: 2023-07-31 | Disposition: A | Discharge status: Home / Self Care | Payer: Medicare Other | Source: Home / Self Care

## 2023-07-31 DIAGNOSIS — J441 Chronic obstructive pulmonary disease with (acute) exacerbation: Secondary | ICD-10-CM | POA: Diagnosis not present

## 2023-07-31 DIAGNOSIS — R0602 Shortness of breath: Secondary | ICD-10-CM | POA: Diagnosis not present

## 2023-07-31 DIAGNOSIS — J019 Acute sinusitis, unspecified: Secondary | ICD-10-CM | POA: Diagnosis not present

## 2023-07-31 DIAGNOSIS — R051 Acute cough: Secondary | ICD-10-CM

## 2023-07-31 MED ORDER — AMOXICILLIN-POT CLAVULANATE 875-125 MG PO TABS: 1.0000 | ORAL_TABLET | Freq: Two times a day (BID) | ORAL | 0 refills | Status: AC

## 2023-07-31 MED ORDER — PROMETHAZINE-DM 6.25-15 MG/5ML PO SYRP: 5.0000 mL | ORAL_SOLUTION | Freq: Four times a day (QID) | ORAL | 0 refills | Status: DC | PRN

## 2023-07-31 NOTE — ED Provider Notes (Signed)
[facility]    CSN: [id number] Arrival date & time: 07/31/23  6160      History   Chief Complaint Chief Complaint  Patient presents with   Sinus Problem   Cough    HPI Eugene Blackburn is a 52 y.o. male with history of COPD (chronic bronchitis and emphysema) and allergies.  Patient presents today for 3 to 4-day history of nasal congestion, sinus pressure, bilateral ear pain/pressure, wheezing/SOB and productive cough.  Patient also reports a lot of postnasal drainage and throat irritation.  Denies fever.  Patient reports he has tried over-the-counter Sudafed and allergy medicine as well as Flonase without any improvement in symptoms.  He reports that he has a history of long-haul COVID and had COVID again about 3 weeks ago. States his symptoms resolved from that and he was feeling well until a few days ago.  Patient is on chronic prednisone 20 mg, Breztri and Advair.  Patient has normal oxygen runs around 93%.  He was seen by pulmonology on 07/20/2023 noted to have 95% oxygen saturation, pulse of 125 and clear lungs.  Patient reports sometimes his pulse is elevated because he has a lot of anxiety and stress and has recently been stressed with driving today.  HPI  Past Medical History:  Diagnosis Date   Asthma    Chronic idiopathic constipation    Chronic obstruct airways disease (HCC)    Chronic pancreatitis (HCC)    Degenerative disorder of muscle    Hearing loss    Hemorrhoids    Hyperlipidemia    Insomnia    Male erectile dysfunction    Nausea    Pancreas (digestive gland) works poorly    Seasonal allergic rhinitis    Vitamin D deficiency     Patient Active Problem List   Diagnosis Date Noted   COPD with chronic bronchitis and emphysema (HCC) 07/26/2023   Tremor 01/26/2019   Numbness and tingling of both legs 01/26/2019   Abdominal pain 12/29/2003    Past Surgical History:  Procedure Laterality Date   PANCREAS SURGERY         Home Medications     Prior to Admission medications   Medication Sig Start Date End Date Taking? Authorizing Provider  amoxicillin-clavulanate (AUGMENTIN) 875-125 MG tablet Take 1 tablet by mouth every 12 (twelve) hours for 7 days. 07/31/23 08/07/23 Yes Shirlee Latch, PA-C  promethazine-dextromethorphan (PROMETHAZINE-DM) 6.25-15 MG/5ML syrup Take 5 mLs by mouth 4 (four) times daily as needed. 07/31/23  Yes Shirlee Latch, PA-C  albuterol (VENTOLIN HFA) 108 (90 Base) MCG/ACT inhaler Inhale 2 puffs into the lungs every 4 (four) hours as needed for wheezing or shortness of breath. 07/20/23   Glenford Bayley, NP  aluminum-magnesium hydroxide-simethicone (MAALOX) 200-200-20 MG/5ML SUSP Take 30 mLs by mouth 4 (four) times daily -  before meals and at bedtime. 06/24/23   White, Elita Boone, NP  Budeson-Glycopyrrol-Formoterol (BREZTRI AEROSPHERE) 160-9-4.8 MCG/ACT AERO Inhale 2 puffs into the lungs in the morning and at bedtime. 07/20/23   Glenford Bayley, NP  Budeson-Glycopyrrol-Formoterol (BREZTRI AEROSPHERE) 160-9-4.8 MCG/ACT AERO Inhale 2 puffs into the lungs in the morning and at bedtime. 07/20/23   Glenford Bayley, NP  clonazePAM (KLONOPIN) 0.5 MG tablet Take 0.5 mg by mouth 2 (two) times daily as needed. 01/14/22   [provider]  famotidine (PEPCID) 20 MG tablet Take 1 tablet (20 mg total) by mouth 2 (two) times daily. 06/24/23   Valinda Hoar, NP  fluticasone (  FLONASE) 50 MCG/ACT nasal spray Place 1 spray into both nostrils daily.    [provider]  furosemide (LASIX) 40 MG tablet Take 40 mg by mouth 2 (two) times daily. 05/02/23   [provider]  ibuprofen (ADVIL,MOTRIN) 600 MG tablet Take 1 tablet (600 mg total) by mouth every 6 (six) hours as needed. 04/17/18   Domenick Gong, MD  ipratropium (ATROVENT) 0.06 % nasal spray Place 2 sprays into both nostrils 4 (four) times daily. 03/12/22   Becky Augusta, NP  KLOR-CON M20 20 MEQ tablet Take 20 mEq by mouth daily. 04/17/23   [provider]  LINZESS 290 MCG CAPS capsule Take 290 mcg by mouth daily. 04/23/23   [provider]  nystatin (MYCOSTATIN) 100000 UNIT/ML suspension Take 5 mLs (500,000 Units total) by mouth 4 (four) times daily. For 7 days, then prn recurrance 06/20/23   Willy Eddy, MD  ondansetron (ZOFRAN) 4 MG tablet Take 4 mg by mouth every 12 (twelve) hours. Patient not taking: Reported on 07/20/2023 05/06/23   [provider]  ondansetron (ZOFRAN-ODT) 4 MG disintegrating tablet Take 1 tablet (4 mg total) by mouth every 8 (eight) hours as needed for nausea or vomiting. 06/24/23   Valinda Hoar, NP  oxyCODONE (OXYCONTIN) 20 mg 12 hr tablet Take 20 mg by mouth every 12 (twelve) hours.    [provider]  PANCREAZE 62130 units CPEP Take 1 capsule by mouth 3 (three) times daily. 02/01/18   [provider]  pantoprazole (PROTONIX) 40 MG tablet Take 40 mg by mouth daily.    [provider]  predniSONE (DELTASONE) 20 MG tablet TAKE 1/2 TABLET BY MOUTH TWICE DAILY 07/20/23   Glenford Bayley, NP    Family History Family History  Problem Relation Age of Onset   Lung cancer Mother    Pancreatic disease Father     Social History Social History   Tobacco Use   Smoking status: Former    Types: Cigarettes   Smokeless tobacco: Never   Tobacco comments:    Patient does not know what year he quit smoking.  Vaping Use   Vaping status: Former  Substance Use Topics   Alcohol use: Yes   Drug use: No     Allergies   Gabapentin, Zithromax [azithromycin], and Levaquin [levofloxacin]   Review of Systems Review of Systems  Constitutional:  Positive for fatigue. Negative for fever.  HENT:  Positive for congestion, ear pain, postnasal drip, rhinorrhea, sinus pressure, sinus pain and sore throat.   Respiratory:  Positive for cough, shortness of breath and wheezing.   Cardiovascular:  Negative for chest pain.  Gastrointestinal:  Negative for abdominal pain,  diarrhea, nausea and vomiting.  Musculoskeletal:  Negative for myalgias.  Neurological:  Negative for weakness, light-headedness and headaches.  Hematological:  Negative for adenopathy.     Physical Exam Triage Vital Signs ED Triage Vitals  Enc Vitals Group     BP      Pulse      Resp      Temp      Temp src      SpO2      Weight      Height      Head Circumference      Peak Flow      Pain Score      Pain Loc      Pain Edu?      Excl. in GC?    No data found.  Updated  Vital Signs BP (!) 153/88 (BP Location: Left Arm)   Pulse (!) 120   Temp 98 F (36.7 C) (Oral)   Resp 17   Ht 6' (1.829 m)   Wt 251 lb (113.9 kg)   SpO2 92%   BMI 34.04 kg/m     Physical Exam Vitals and nursing note reviewed.  Constitutional:      General: He is not in acute distress.    Appearance: Normal appearance. He is well-developed. He is not ill-appearing.  HENT:     Head: Normocephalic and atraumatic.     Right Ear: Ear canal and external ear normal. A middle ear effusion is present.     Left Ear: Ear canal and external ear normal. A middle ear effusion is present.     Nose: Congestion present.     Mouth/Throat:     Mouth: Mucous membranes are moist.     Pharynx: Oropharynx is clear. Posterior oropharyngeal erythema present.  Eyes:     General: No scleral icterus.    Conjunctiva/sclera: Conjunctivae normal.  Cardiovascular:     Rate and Rhythm: Regular rhythm. Tachycardia present.     Heart sounds: Normal heart sounds.  Pulmonary:     Effort: Pulmonary effort is normal. No respiratory distress.     Breath sounds: Wheezing present.  Musculoskeletal:        General: No swelling.     Cervical back: Neck supple.  Skin:    General: Skin is warm and dry.     Capillary Refill: Capillary refill takes less than 2 seconds.  Neurological:     General: No focal deficit present.     Mental Status: He is alert. Mental status is at baseline.     Motor: No weakness.     Gait: Gait normal.   Psychiatric:        Mood and Affect: Mood is anxious.      UC Treatments / Results  Labs (all labs ordered are listed, but only abnormal results are displayed) Labs Reviewed - No data to display  EKG   Radiology DG Chest 2 View  Result Date: 07/31/2023 CLINICAL DATA:  Congestion and shortness of breath. EXAM: CHEST - 2 VIEW COMPARISON:  06/20/2023 FINDINGS: Stable asymmetric elevation left hemidiaphragm. The lungs are clear without focal pneumonia, edema, pneumothorax or pleural effusion. The cardiopericardial silhouette is within normal limits for size. No acute bony abnormality. IMPRESSION: No active cardiopulmonary disease. Electronically Signed   By: Kennith Center M.D.   On: 07/31/2023 09:15    Procedures Procedures (including critical care time)  Medications Ordered in UC Medications - No data to display  Initial Impression / Assessment and Plan / UC Course  I have reviewed the triage vital signs and the nursing notes.  Pertinent labs & imaging results that were available during my care of the patient were reviewed by me and considered in my medical decision making (see chart for details).   52 year old male with history of COPD and allergies presents for 3 to 4-day history of nasal congestion, sinus pressure, ear pain/pressure, postnasal drainage, wheezing, SOB, and productive cough.  Denies fever. History of COVID 3 weeks ago. On chronic prednisone 20 mg, albuterol nebulizer (performed just before arrival), Advair and Breztri. Seen by pulmonology 1.5 weeks ago.   Pulse 120-129 bpm (appears anxious). O2 saturation 88-92%. Speaking rapidly at times. BP 153/88.  Patient is in no acute distress.  He is ill-appearing but nontoxic.  On exam he does have significant nasal  congestion and erythema of posterior pharynx with mild swelling of posterior pharynx.  Clear effusion of bilateral TMs.  Diffuse wheezing throughout all lung fields.   CXR ordered to evaluate for possible  pneumonia.  X-ray without any acute abnormalities.  Suspect sinusitis, possibly secondary bacterial since he was ill a few weeks ago with COVID and had a period where symptoms had improved/resolved.  Also, patient does have frequent COPD exacerbations so we will cover him with antibiotics at this time.  Was going to send doxycycline but he requests Augmentin.  States that helps him better.  Also requests Promethazine DM so sent that to the pharmacy and advised him to continue home prednisone, inhalers and breathing treatments.  I did offer him a corticosteroid injection in the clinic today but he declined stating that he wants to talk to his pulmonologist about coming off of the corticosteroids altogether.  Advised him to reach out to pulmonology for appointment to discuss this.  Reviewed return precautions.    Final Clinical Impressions(s) / UC Diagnoses   Final diagnoses:  Acute sinusitis, recurrence not specified, unspecified location  COPD exacerbation (HCC)  Acute cough     Discharge Instructions      -X-ray without any evidence of pneumonia. - I sent antibiotic and cough medicine to pharmacy.  Continue with breathing treatments at home and inhalers. - Reach out to your pulmonologist about trying to wean off the prednisone if that still something interested in.     ED Prescriptions     Medication Sig Dispense Auth. Provider   amoxicillin-clavulanate (AUGMENTIN) 875-125 MG tablet Take 1 tablet by mouth every 12 (twelve) hours for 7 days. 14 tablet Shirlee Latch, PA-C   promethazine-dextromethorphan (PROMETHAZINE-DM) 6.25-15 MG/5ML syrup Take 5 mLs by mouth 4 (four) times daily as needed. 118 mL Shirlee Latch, PA-C      PDMP not reviewed this encounter.      Shirlee Latch, PA-C 07/31/23 [phone]

## 2023-07-31 NOTE — ED Triage Notes (Signed)
Patient c/o cough, sinus congestion and drainage that started 2-3 days ago.  Patient denies fevers.

## 2023-07-31 NOTE — Discharge Instructions (Signed)
-  X-ray without any evidence of pneumonia. - I sent antibiotic and cough medicine to pharmacy.  Continue with breathing treatments at home and inhalers. - Reach out to your pulmonologist about trying to wean off the prednisone if that still something interested in.

## 2023-08-10 ENCOUNTER — Ambulatory Visit
Admission: EM | Admit: 2023-08-10 | Discharge: 2023-08-10 | Disposition: A | Discharge status: Home / Self Care | Payer: Medicare Other | Attending: Physician Assistant | Admitting: Physician Assistant

## 2023-08-10 ENCOUNTER — Other Ambulatory Visit: Payer: Self-pay

## 2023-08-10 DIAGNOSIS — R051 Acute cough: Secondary | ICD-10-CM | POA: Diagnosis not present

## 2023-08-10 DIAGNOSIS — Z7952 Long term (current) use of systemic steroids: Secondary | ICD-10-CM | POA: Diagnosis not present

## 2023-08-10 DIAGNOSIS — B370 Candidal stomatitis: Secondary | ICD-10-CM | POA: Diagnosis not present

## 2023-08-10 DIAGNOSIS — J441 Chronic obstructive pulmonary disease with (acute) exacerbation: Secondary | ICD-10-CM | POA: Diagnosis not present

## 2023-08-10 MED ORDER — PROMETHAZINE-DM 6.25-15 MG/5ML PO SYRP: 5.0000 mL | ORAL_SOLUTION | Freq: Every evening | ORAL | 0 refills | Status: DC | PRN

## 2023-08-10 MED ORDER — FLUCONAZOLE 200 MG PO TABS: 200.0000 mg | ORAL_TABLET | Freq: Every day | ORAL | 0 refills | Status: AC

## 2023-08-10 MED ORDER — NYSTATIN 100000 UNIT/ML MT SUSP: 500000.0000 [IU] | Freq: Four times a day (QID) | OROMUCOSAL | 0 refills | Status: AC

## 2023-08-10 MED ORDER — DOXYCYCLINE HYCLATE 100 MG PO CAPS: 100.0000 mg | ORAL_CAPSULE | Freq: Two times a day (BID) | ORAL | 0 refills | Status: AC

## 2023-08-10 MED ORDER — PREDNISONE 20 MG PO TABS: ORAL_TABLET | ORAL | 0 refills | Status: DC

## 2023-08-10 NOTE — ED Triage Notes (Addendum)
Pt is here following up after testing POSITIVE for COVID 3 weeks prior to his 07/31/2023 visit. Pt states he has cracking in his chest when he cough, pt has taken OTC meds to relieve discomfort along with the prescribed meds given on 07/31/2023.

## 2023-08-10 NOTE — ED Provider Notes (Signed)
[facility]    CSN: [id number] Arrival date & time: 08/10/23  0807      History   Chief Complaint Chief Complaint  Patient presents with   Shortness of Breath   Chest congestion   Cough    HPI Eugene Blackburn is a 52 y.o. male with history of COPD (chronic bronchitis and emphysema) and allergies.  Patient presents today for follow up from 07/31/23 visit for COPD exacerbation. Seen by me at previous visit. Negative chest x-ray. Report of COVID about 1 month ago. Treated with Augmentin and Promethazine DM. On long term corticosteroids (prednisone 20 mg), Breztri, and Advair. Patient says the Augmentin helped the sinus congestion but now he has increased chest congestion, wheezing and SOB. States he increased prednisone to 40 mg daily over the past couple of days. Has been performing breathing treatments 4x daily and taking Mucinex. He says he has thrush in his throat now. Has had to be treated with Nysatin and Diflucan for thrush before. He denies any fevers. Next appointment with pulmonology not for a couple more months.  He was seen by pulmonology on 07/20/2023 noted to have 95% oxygen saturation, pulse of 125 and clear lungs.  Patient reports sometimes his pulse is elevated because he has a lot of anxiety and stress and has recently been stressed . Pulse today is 132 and oxygen is 95%.  HPI  Past Medical History:  Diagnosis Date   Asthma    Chronic idiopathic constipation    Chronic obstruct airways disease (HCC)    Chronic pancreatitis (HCC)    Degenerative disorder of muscle    Hearing loss    Hemorrhoids    Hyperlipidemia    Insomnia    Male erectile dysfunction    Nausea    Pancreas (digestive gland) works poorly    Seasonal allergic rhinitis    Vitamin D deficiency     Patient Active Problem List   Diagnosis Date Noted   COPD with chronic bronchitis and emphysema (HCC) 07/26/2023   Tremor 01/26/2019   Numbness and tingling of both legs 01/26/2019    Abdominal pain 12/29/2003    Past Surgical History:  Procedure Laterality Date   PANCREAS SURGERY         Home Medications    Prior to Admission medications   Medication Sig Start Date End Date Taking? Authorizing Provider  doxycycline (VIBRAMYCIN) 100 MG capsule Take 1 capsule (100 mg total) by mouth 2 (two) times daily for 7 days. 08/10/23 08/17/23 Yes Shirlee Latch, PA-C  fluconazole (DIFLUCAN) 200 MG tablet Take 1 tablet (200 mg total) by mouth daily for 14 days. 08/10/23 08/24/23 Yes Shirlee Latch, PA-C  nystatin (MYCOSTATIN) 100000 UNIT/ML suspension Take 5 mLs (500,000 Units total) by mouth 4 (four) times daily for 14 days. 08/10/23 08/24/23 Yes Shirlee Latch, PA-C  predniSONE (DELTASONE) 20 MG tablet Take 40 mg PO x 5 days and then take 20 mg daily thereafter 08/10/23  Yes Shirlee Latch, PA-C  promethazine-dextromethorphan (PROMETHAZINE-DM) 6.25-15 MG/5ML syrup Take 5 mLs by mouth at bedtime as needed for cough. 08/10/23  Yes Eusebio Friendly B, PA-C  albuterol (VENTOLIN HFA) 108 (90 Base) MCG/ACT inhaler Inhale 2 puffs into the lungs every 4 (four) hours as needed for wheezing or shortness of breath. 07/20/23   Glenford Bayley, NP  aluminum-magnesium hydroxide-simethicone (MAALOX) 200-200-20 MG/5ML SUSP Take 30 mLs by mouth 4 (four) times daily -  before meals and at bedtime. 06/24/23  White, Adrienne R, NP  Budeson-Glycopyrrol-Formoterol (BREZTRI AEROSPHERE) 160-9-4.8 MCG/ACT AERO Inhale 2 puffs into the lungs in the morning and at bedtime. 07/20/23   Glenford Bayley, NP  Budeson-Glycopyrrol-Formoterol (BREZTRI AEROSPHERE) 160-9-4.8 MCG/ACT AERO Inhale 2 puffs into the lungs in the morning and at bedtime. 07/20/23   Glenford Bayley, NP  clonazePAM (KLONOPIN) 0.5 MG tablet Take 0.5 mg by mouth 2 (two) times daily as needed. 01/14/22   [provider]  famotidine (PEPCID) 20 MG tablet Take 1 tablet (20 mg total) by mouth 2 (two) times daily. 06/24/23   White, Elita Boone, NP   fluticasone (FLONASE) 50 MCG/ACT nasal spray Place 1 spray into both nostrils daily.    [provider]  furosemide (LASIX) 40 MG tablet Take 40 mg by mouth 2 (two) times daily. 05/02/23   [provider]  ibuprofen (ADVIL,MOTRIN) 600 MG tablet Take 1 tablet (600 mg total) by mouth every 6 (six) hours as needed. 04/17/18   Domenick Gong, MD  ipratropium (ATROVENT) 0.06 % nasal spray Place 2 sprays into both nostrils 4 (four) times daily. 03/12/22   Becky Augusta, NP  KLOR-CON M20 20 MEQ tablet Take 20 mEq by mouth daily. 04/17/23   [provider]  LINZESS 290 MCG CAPS capsule Take 290 mcg by mouth daily. 04/23/23   [provider]  ondansetron (ZOFRAN) 4 MG tablet Take 4 mg by mouth every 12 (twelve) hours. Patient not taking: Reported on 07/20/2023 05/06/23   [provider]  ondansetron (ZOFRAN-ODT) 4 MG disintegrating tablet Take 1 tablet (4 mg total) by mouth every 8 (eight) hours as needed for nausea or vomiting. 06/24/23   Valinda Hoar, NP  oxyCODONE (OXYCONTIN) 20 mg 12 hr tablet Take 20 mg by mouth every 12 (twelve) hours.    [provider]  PANCREAZE 41324 units CPEP Take 1 capsule by mouth 3 (three) times daily. 02/01/18   [provider]  pantoprazole (PROTONIX) 40 MG tablet Take 40 mg by mouth daily.    [provider]    Family History Family History  Problem Relation Age of Onset   Lung cancer Mother    Pancreatic disease Father     Social History Social History   Tobacco Use   Smoking status: Former    Types: Cigarettes   Smokeless tobacco: Never   Tobacco comments:    Patient does not know what year he quit smoking.  Vaping Use   Vaping status: Former  Substance Use Topics   Alcohol use: Yes   Drug use: No     Allergies   Gabapentin, Zithromax [azithromycin], and Levaquin [levofloxacin]   Review of Systems Review of Systems  Constitutional:  Positive for fatigue. Negative for fever.   HENT:  Positive for congestion, sore throat and trouble swallowing. Negative for ear pain, postnasal drip, rhinorrhea, sinus pressure and sinus pain.   Respiratory:  Positive for cough, chest tightness, shortness of breath and wheezing.   Cardiovascular:  Negative for chest pain.  Gastrointestinal:  Negative for abdominal pain, diarrhea, nausea and vomiting.  Musculoskeletal:  Negative for myalgias.  Neurological:  Negative for weakness, light-headedness and headaches.  Hematological:  Negative for adenopathy.     Physical Exam Triage Vital Signs ED Triage Vitals  Enc Vitals Group     BP      Pulse      Resp      Temp      Temp src      SpO2  Weight      Height      Head Circumference      Peak Flow      Pain Score      Pain Loc      Pain Edu?      Excl. in GC?    No data found.  Updated Vital Signs BP 119/87 (BP Location: Right Arm)   Pulse (!) 132   Temp 98.4 F (36.9 C) (Oral)   Resp 19   SpO2 95%     Physical Exam Vitals and nursing note reviewed.  Constitutional:      General: He is not in acute distress.    Appearance: Normal appearance. He is well-developed. He is ill-appearing.  HENT:     Head: Normocephalic and atraumatic.     Nose: Nose normal.     Mouth/Throat:     Mouth: Mucous membranes are moist.     Pharynx: Oropharynx is clear. Posterior oropharyngeal erythema present.  Eyes:     General: No scleral icterus.    Conjunctiva/sclera: Conjunctivae normal.  Cardiovascular:     Rate and Rhythm: Regular rhythm. Tachycardia present.     Heart sounds: Normal heart sounds.  Pulmonary:     Effort: Pulmonary effort is normal. No respiratory distress.     Breath sounds: Wheezing present.  Musculoskeletal:     Cervical back: Neck supple.  Skin:    General: Skin is warm and dry.     Capillary Refill: Capillary refill takes less than 2 seconds.  Neurological:     General: No focal deficit present.     Mental Status: He is alert. Mental status is  at baseline.     Motor: No weakness.     Gait: Gait normal.  Psychiatric:        Mood and Affect: Mood is anxious.      UC Treatments / Results  Labs (all labs ordered are listed, but only abnormal results are displayed) Labs Reviewed - No data to display  EKG   Radiology No results found.  Procedures Procedures (including critical care time)  Medications Ordered in UC Medications - No data to display  Initial Impression / Assessment and Plan / UC Course  I have reviewed the triage vital signs and the nursing notes.  Pertinent labs & imaging results that were available during my care of the patient were reviewed by me and considered in my medical decision making (see chart for details).   52 year old male with history of COPD and allergies presents for follow-up of COPD exacerbation.  Patient seen here 10 days ago by myself.  Treated with Augmentin and Promethazine DM.  Is chronically on prednisone 20 mg.  Reports sinus congestion and pressure cleared up but now he has increased chest congestion, productive cough, wheezing and increased shortness of breath.  Increased his prednisone to 40 mg a day over the past couple of days and started performing breathing treatments 4 times daily.  Now he has thrush.  Patient is on Advair and Breztri inhalers.  Chest x-ray previous visit was clear.  Denies any fevers.  Pulse 132 bpm (appears anxious). O2 saturation 95%. Speaking rapidly at times. BP 119/87.   Patient is in no acute distress.  He is ill-appearing but nontoxic.  On exam he does have erythema of posterior pharynx with mild swelling of posterior pharynx.  No exudates noted but he states it feels like his throat is clogged and that is consistent with when he has had  thrush before.  Mild wheezing heard throughout all lung fields but reduced air movement throughout.  COPD exacerbation.  Treating with doxycycline at this time.  Encourage plenty fluids when taking this medication  especially since he likely has thrush esophagitis.  Also sent Diflucan and nystatin.  Patient to take 40 mg prednisone over the next 5 days and then decrease to 20 mg a day which is his standard dose.  Patient reports he will have enough medication to get him to his next fill date which is August 24 so I sent enough medication for him.  Advised Mucinex during the day and Promethazine DM at nighttime and continue with breathing treatments.  Advised if no improvement or continued have symptoms after this treatment he needs to follow-up with pulmonology.  Discussed going to emergency department for any acute worsening of symptoms especially fever or increased breathing difficulty.  Patient is agreeable.  Final Clinical Impressions(s) / UC Diagnoses   Final diagnoses:  COPD exacerbation (HCC)  Acute cough  Thrush  Long-term corticosteroid use     Discharge Instructions      -Treating continued COPD exacerbation with doxycycline.  Make sure you drink a lot of water with this medication.  Also sent in the prednisone.  Take 40 mg for 5 days and then go back to your usual 20 mg a day dose thereafter. - Continue breathing treatments 4 times daily. - Treating thrush with nystatin mouthwash and oral Diflucan. - Follow-up with your pulmonologist especially if you continue to have symptoms after the next week. - Go to the ER for any acute worsening of your symptoms including increased difficulty breathing, weakness, fever.      ED Prescriptions     Medication Sig Dispense Auth. Provider   doxycycline (VIBRAMYCIN) 100 MG capsule Take 1 capsule (100 mg total) by mouth 2 (two) times daily for 7 days. 14 capsule Eusebio Friendly B, PA-C   predniSONE (DELTASONE) 20 MG tablet Take 40 mg PO x 5 days and then take 20 mg daily thereafter 20 tablet Eusebio Friendly B, PA-C   nystatin (MYCOSTATIN) 100000 UNIT/ML suspension Take 5 mLs (500,000 Units total) by mouth 4 (four) times daily for 14 days. 240 mL Eusebio Friendly B, PA-C   fluconazole (DIFLUCAN) 200 MG tablet Take 1 tablet (200 mg total) by mouth daily for 14 days. 14 tablet Shirlee Latch, PA-C   promethazine-dextromethorphan (PROMETHAZINE-DM) 6.25-15 MG/5ML syrup Take 5 mLs by mouth at bedtime as needed for cough. 118 mL Shirlee Latch, PA-C      PDMP not reviewed this encounter.      Shirlee Latch, PA-C 08/10/23 [phone]

## 2023-08-10 NOTE — Discharge Instructions (Addendum)
-  Treating continued COPD exacerbation with doxycycline.  Make sure you drink a lot of water with this medication.  Also sent in the prednisone.  Take 40 mg for 5 days and then go back to your usual 20 mg a day dose thereafter. - Continue breathing treatments 4 times daily. - Treating thrush with nystatin mouthwash and oral Diflucan. - Follow-up with your pulmonologist especially if you continue to have symptoms after the next week. - Go to the ER for any acute worsening of your symptoms including increased difficulty breathing, weakness, fever.

## 2023-09-02 ENCOUNTER — Ambulatory Visit
Admission: EM | Admit: 2023-09-02 | Discharge: 2023-09-02 | Disposition: A | Discharge status: Home / Self Care | Payer: Medicare Other | Attending: Physician Assistant | Admitting: Physician Assistant

## 2023-09-02 ENCOUNTER — Encounter: Payer: Self-pay | Admitting: Emergency Medicine

## 2023-09-02 DIAGNOSIS — K610 Anal abscess: Secondary | ICD-10-CM | POA: Diagnosis not present

## 2023-09-02 MED ORDER — AMOXICILLIN-POT CLAVULANATE 875-125 MG PO TABS: 1.0000 | ORAL_TABLET | Freq: Two times a day (BID) | ORAL | 0 refills | Status: AC

## 2023-09-02 NOTE — ED Provider Notes (Signed)
[facility]    CSN: [id number] Arrival date & time: 09/02/23  0902      History   Chief Complaint Chief Complaint  Patient presents with   Abscess    HPI Eugene Blackburn is a 52 y.o. male presenting for 4 to 5 days of swelling and pain around his anus.  He believes he has an abscess.  He says it does not feel swollen internally and he has had normal BMs.  Has been taking Mylanta to soften vomitus.  He reports it hurts to sit and he has been laying on his left side a lot.  He denies any fevers but says he feels nauseous at times and thinks he is getting worse.  He says he thinks it will need to be "lanced before will get better."  He has not been treating condition in any way at home.  Patient has a history of COPD, chronic pancreatitis, asthma and takes OxyContin for chronic pain.  HPI  Past Medical History:  Diagnosis Date   Asthma    Chronic idiopathic constipation    Chronic obstruct airways disease (HCC)    Chronic pancreatitis (HCC)    Degenerative disorder of muscle    Hearing loss    Hemorrhoids    Hyperlipidemia    Insomnia    Male erectile dysfunction    Nausea    Pancreas (digestive gland) works poorly    Seasonal allergic rhinitis    Vitamin D deficiency     Patient Active Problem List   Diagnosis Date Noted   COPD with chronic bronchitis and emphysema (HCC) 07/26/2023   Tremor 01/26/2019   Numbness and tingling of both legs 01/26/2019   Abdominal pain 12/29/2003    Past Surgical History:  Procedure Laterality Date   PANCREAS SURGERY         Home Medications    Prior to Admission medications   Medication Sig Start Date End Date Taking? Authorizing Provider  amoxicillin-clavulanate (AUGMENTIN) 875-125 MG tablet Take 1 tablet by mouth every 12 (twelve) hours for 7 days. 09/02/23 09/09/23 Yes Shirlee Latch, PA-C  albuterol (VENTOLIN HFA) 108 (90 Base) MCG/ACT inhaler Inhale 2 puffs into the lungs every 4 (four) hours as needed for  wheezing or shortness of breath. 07/20/23   Glenford Bayley, NP  aluminum-magnesium hydroxide-simethicone (MAALOX) 200-200-20 MG/5ML SUSP Take 30 mLs by mouth 4 (four) times daily -  before meals and at bedtime. 06/24/23   White, Elita Boone, NP  Budeson-Glycopyrrol-Formoterol (BREZTRI AEROSPHERE) 160-9-4.8 MCG/ACT AERO Inhale 2 puffs into the lungs in the morning and at bedtime. 07/20/23   Glenford Bayley, NP  Budeson-Glycopyrrol-Formoterol (BREZTRI AEROSPHERE) 160-9-4.8 MCG/ACT AERO Inhale 2 puffs into the lungs in the morning and at bedtime. 07/20/23   Glenford Bayley, NP  clonazePAM (KLONOPIN) 0.5 MG tablet Take 0.5 mg by mouth 2 (two) times daily as needed. 01/14/22   [provider]  famotidine (PEPCID) 20 MG tablet Take 1 tablet (20 mg total) by mouth 2 (two) times daily. 06/24/23   White, Elita Boone, NP  fluticasone (FLONASE) 50 MCG/ACT nasal spray Place 1 spray into both nostrils daily.    [provider]  furosemide (LASIX) 40 MG tablet Take 40 mg by mouth 2 (two) times daily. 05/02/23   [provider]  ibuprofen (ADVIL,MOTRIN) 600 MG tablet Take 1 tablet (600 mg total) by mouth every 6 (six) hours as needed. 04/17/18   Domenick Gong, MD  ipratropium (ATROVENT) 0.06 % nasal  spray Place 2 sprays into both nostrils 4 (four) times daily. 03/12/22   Becky Augusta, NP  KLOR-CON M20 20 MEQ tablet Take 20 mEq by mouth daily. 04/17/23   [provider]  LINZESS 290 MCG CAPS capsule Take 290 mcg by mouth daily. 04/23/23   [provider]  ondansetron (ZOFRAN) 4 MG tablet Take 4 mg by mouth every 12 (twelve) hours. Patient not taking: Reported on 07/20/2023 05/06/23   [provider]  ondansetron (ZOFRAN-ODT) 4 MG disintegrating tablet Take 1 tablet (4 mg total) by mouth every 8 (eight) hours as needed for nausea or vomiting. 06/24/23   Valinda Hoar, NP  oxyCODONE (OXYCONTIN) 20 mg 12 hr tablet Take 20 mg by mouth every 12 (twelve) hours.     [provider]  PANCREAZE 95621 units CPEP Take 1 capsule by mouth 3 (three) times daily. 02/01/18   [provider]  pantoprazole (PROTONIX) 40 MG tablet Take 40 mg by mouth daily.    [provider]  predniSONE (DELTASONE) 20 MG tablet Take 40 mg PO x 5 days and then take 20 mg daily thereafter 08/10/23   Shirlee Latch, PA-C  promethazine-dextromethorphan (PROMETHAZINE-DM) 6.25-15 MG/5ML syrup Take 5 mLs by mouth at bedtime as needed for cough. 08/10/23   Shirlee Latch, PA-C    Family History Family History  Problem Relation Age of Onset   Lung cancer Mother    Pancreatic disease Father     Social History Social History   Tobacco Use   Smoking status: Former    Types: Cigarettes   Smokeless tobacco: Never   Tobacco comments:    Patient does not know what year he quit smoking.  Vaping Use   Vaping status: Former  Substance Use Topics   Alcohol use: Yes   Drug use: No     Allergies   Gabapentin, Zithromax [azithromycin], and Levaquin [levofloxacin]   Review of Systems Review of Systems  Constitutional:  Negative for fatigue and fever.  Gastrointestinal:  Positive for nausea. Negative for abdominal pain, anal bleeding, blood in stool, constipation, diarrhea, rectal pain and vomiting.       Anal pain/swelling  Skin:  Positive for color change.     Physical Exam Triage Vital Signs ED Triage Vitals [09/02/23 0928]  Encounter Vitals Group     BP 108/73     Systolic BP Percentile      Diastolic BP Percentile      Pulse Rate (!) 138     Resp 19     Temp 98.4 F (36.9 C)     Temp Source Oral     SpO2 95 %     Weight      Height      Head Circumference      Peak Flow      Pain Score 10     Pain Loc      Pain Education      Exclude from Growth Chart    No data found.  Updated Vital Signs BP 108/73 (BP Location: Left Arm)   Pulse (!) 138   Temp 98.4 F (36.9 C) (Oral)   Resp 19   SpO2 95%     Physical Exam Vitals and  nursing note reviewed.  Constitutional:      General: He is not in acute distress.    Appearance: Normal appearance. He is well-developed. He is not ill-appearing.  HENT:     Head: Normocephalic and atraumatic.  Eyes:  General: No scleral icterus.    Conjunctiva/sclera: Conjunctivae normal.  Cardiovascular:     Rate and Rhythm: Normal rate.  Pulmonary:     Effort: Pulmonary effort is normal. No respiratory distress.  Abdominal:     Palpations: Abdomen is soft.     Tenderness: There is no abdominal tenderness.  Genitourinary:    Rectum: External hemorrhoid (1 tiny external hemorrhoid) present. No mass, tenderness, anal fissure or internal hemorrhoid.       Comments: There is a large perianal abscess (no rectal abscess noted on DRE) which measures approximately 4.5 cm x 3 cm)--erythema/induration/fluctuance/TTP Musculoskeletal:     Cervical back: Neck supple.  Skin:    General: Skin is warm and dry.     Capillary Refill: Capillary refill takes less than 2 seconds.  Neurological:     General: No focal deficit present.     Mental Status: He is alert. Mental status is at baseline.     Motor: No weakness.     Gait: Gait normal.  Psychiatric:        Mood and Affect: Mood normal.        Behavior: Behavior normal.      UC Treatments / Results  Labs (all labs ordered are listed, but only abnormal results are displayed) Labs Reviewed - No data to display  EKG   Radiology No results found.  Procedures Incision and Drainage  Date/Time: 09/02/2023 10:55 AM  Performed by: Shirlee Latch, PA-C Authorized by: Shirlee Latch, PA-C   Consent:    Consent obtained:  Verbal   Consent given by:  Patient   Risks discussed:  Bleeding, infection, incomplete drainage and pain   Alternatives discussed:  Alternative treatment, delayed treatment and observation Universal protocol:    Patient identity confirmed:  Verbally with patient Location:    Type:  Abscess   Size:  4.5 cm x  3 cm   Location:  Anogenital   Anogenital location:  Perianal Pre-procedure details:    Skin preparation:  Povidone-iodine Anesthesia:    Anesthesia method:  Local infiltration   Local anesthetic:  Lidocaine 1% w/o epi Procedure type:    Complexity:  Complex Procedure details:    Incision types:  Single straight   Wound management:  Probed and deloculated   Drainage:  Bloody and purulent   Drainage amount:  Copious   Wound treatment:  Wound left open   Packing materials:  1/2 in iodoform gauze Post-procedure details:    Procedure completion:  Tolerated well, no immediate complications  (including critical care time)  Medications Ordered in UC Medications - No data to display  Initial Impression / Assessment and Plan / UC Course  I have reviewed the triage vital signs and the nursing notes.  Pertinent labs & imaging results that were available during my care of the patient were reviewed by me and considered in my medical decision making (see chart for details).   52 year old male presents for perianal abscess for 4 to 5 days.  On exam he has an abscess which measures 4-1/2 cm x 3 cm.  No perirectal abscess noted on DRE.  Patient would like the area to be incised and drained.  Chaperone present to assist with procedure.  Area anesthetized with local infiltration of lidocaine.  Cleaned area with iodine.  Single straight incision made with 11 blade scalpel.  Copious amount of purulent yellowish and brownish foul-smelling drainage noted.  Patient tolerated this well.  Some difficulty with packing.  Patient had  some discomfort with this and was not able to have the area completely packed.  Will have patient return in 2 days for recheck to see if the packing needs to come out and more placed.  It is possible this packing could fall out on its own.  Advised if it does and symptoms are improving he does not necessarily need to return.  Will start patient on Augmentin.  He took Augmentin last  month for sinusitis.  Considered metronidazole and ciprofloxacin but he has contraindication to Cipro given history of tendinitis with Levaquin.  Advised to change bandage regularly.  Take home pain medication as needed.  Advised if fever or worsening symptoms he should go to the ER.   Final Clinical Impressions(s) / UC Diagnoses   Final diagnoses:  Perianal abscess     Discharge Instructions      -Return in 2 days to have area re-checked and packing removed and re-packed if needed. -Start antibiotics right away. -Change bandage often as it will likely bleed and drain a lot.  -Take home oxycontin as needed for pain. -Go to ER if fever or worsening symptoms.     ED Prescriptions     Medication Sig Dispense Auth. Provider   amoxicillin-clavulanate (AUGMENTIN) 875-125 MG tablet Take 1 tablet by mouth every 12 (twelve) hours for 7 days. 14 tablet Shirlee Latch, PA-C      I have reviewed the PDMP during this encounter.   Shirlee Latch, PA-C 09/02/23 1100

## 2023-09-02 NOTE — Discharge Instructions (Addendum)
-  Return in 2 days to have area re-checked and packing removed and re-packed if needed. -Start antibiotics right away. -Change bandage often as it will likely bleed and drain a lot.  -Take home oxycontin as needed for pain. -Go to ER if fever or worsening symptoms.

## 2023-09-02 NOTE — ED Triage Notes (Signed)
Pt presents with an abscess near his rectum that is causing severe pain making it difficult to sit x 4-5 days.

## 2023-09-07 ENCOUNTER — Other Ambulatory Visit: Payer: Self-pay

## 2023-09-24 ENCOUNTER — Ambulatory Visit: Payer: Medicaid Other | Admitting: Urology

## 2023-10-12 ENCOUNTER — Other Ambulatory Visit: Payer: Self-pay | Admitting: Primary Care

## 2023-10-12 ENCOUNTER — Ambulatory Visit
Admission: EM | Admit: 2023-10-12 | Discharge: 2023-10-12 | Disposition: A | Discharge status: Home / Self Care | Payer: Medicare Other | Attending: Emergency Medicine | Admitting: Emergency Medicine

## 2023-10-12 DIAGNOSIS — H6121 Impacted cerumen, right ear: Secondary | ICD-10-CM | POA: Insufficient documentation

## 2023-10-12 DIAGNOSIS — R220 Localized swelling, mass and lump, head: Secondary | ICD-10-CM | POA: Insufficient documentation

## 2023-10-12 DIAGNOSIS — Z760 Encounter for issue of repeat prescription: Secondary | ICD-10-CM | POA: Insufficient documentation

## 2023-10-12 DIAGNOSIS — B370 Candidal stomatitis: Secondary | ICD-10-CM | POA: Diagnosis not present

## 2023-10-12 DIAGNOSIS — J0100 Acute maxillary sinusitis, unspecified: Secondary | ICD-10-CM | POA: Insufficient documentation

## 2023-10-12 LAB — CBC WITH DIFFERENTIAL/PLATELET
Abs Immature Granulocytes: 0.05 10*3/uL (ref 0.00–0.07)
Basophils Absolute: 0 10*3/uL (ref 0.0–0.1)
Basophils Relative: 0 %
Eosinophils Absolute: 0 10*3/uL (ref 0.0–0.5)
Eosinophils Relative: 0 %
HCT: 42.7 % (ref 39.0–52.0)
Hemoglobin: 13.8 g/dL (ref 13.0–17.0)
Immature Granulocytes: 0 %
Lymphocytes Relative: 6 %
Lymphs Abs: 0.8 10*3/uL (ref 0.7–4.0)
MCH: 32.1 pg (ref 26.0–34.0)
MCHC: 32.3 g/dL (ref 30.0–36.0)
MCV: 99.3 fL (ref 80.0–100.0)
Monocytes Absolute: 0.7 10*3/uL (ref 0.1–1.0)
Monocytes Relative: 6 %
Neutro Abs: 10.9 10*3/uL — ABNORMAL HIGH (ref 1.7–7.7)
Neutrophils Relative %: 88 %
Platelets: 306 10*3/uL (ref 150–400)
RBC: 4.3 MIL/uL (ref 4.22–5.81)
RDW: 15.3 % (ref 11.5–15.5)
WBC: 12.5 10*3/uL — ABNORMAL HIGH (ref 4.0–10.5)
nRBC: 0 % (ref 0.0–0.2)

## 2023-10-12 LAB — BASIC METABOLIC PANEL
Anion gap: 10 (ref 5–15)
BUN: 13 mg/dL (ref 6–20)
CO2: 28 mmol/L (ref 22–32)
Calcium: 9.3 mg/dL (ref 8.9–10.3)
Chloride: 100 mmol/L (ref 98–111)
Creatinine, Ser: 1.12 mg/dL (ref 0.61–1.24)
GFR, Estimated: >60 mL/min (ref >60)
Glucose, Bld: 107 mg/dL — ABNORMAL HIGH (ref 70–99)
Potassium: 4.4 mmol/L (ref 3.5–5.1)
Sodium: 138 mmol/L (ref 135–145)

## 2023-10-12 MED ORDER — AMOXICILLIN-POT CLAVULANATE 875-125 MG PO TABS: 1.0000 | ORAL_TABLET | Freq: Two times a day (BID) | ORAL | 0 refills | Status: AC

## 2023-10-12 MED ORDER — DOXYCYCLINE HYCLATE 100 MG PO CAPS: 100.0000 mg | ORAL_CAPSULE | Freq: Two times a day (BID) | ORAL | 0 refills | Status: AC

## 2023-10-12 MED ORDER — CEFTRIAXONE SODIUM 1 G IJ SOLR
1.0000 g | Freq: Once | INTRAMUSCULAR | Status: AC
  Administered 2023-10-12: 1 g via INTRAMUSCULAR

## 2023-10-12 MED ORDER — MOMETASONE FUROATE 50 MCG/ACT NA SUSP: 2.0000 | Freq: Every day | NASAL | 0 refills | Status: AC

## 2023-10-12 MED ORDER — NYSTATIN 100000 UNIT/ML MT SUSP: 500000.0000 [IU] | Freq: Four times a day (QID) | OROMUCOSAL | 0 refills | Status: DC

## 2023-10-12 NOTE — ED Triage Notes (Signed)
Pt c/o R sided facial pain & edema x1 wk. Denies any dental pain. Concerned about cyst or sinus infection.

## 2023-10-12 NOTE — ED Provider Notes (Signed)
HPI  SUBJECTIVE:  Eugene Blackburn is a 52 y.o. male who presents with 2 issues:   First, he reports 10 days of constant right sided maxillary sinus pain and pressure, facial swelling, facial induration, increased temperature.  He reports nasal congestion and clear rhinorrhea.  He also reports right ear pressure and dizziness when he stands up.  No upper gingival pain.  He states that he does not have any upper teeth.  No fevers, no trauma to the face, insect bite.  Denies pain with extraocular movements, headache, facial droop, arm or leg weakness, visual changes.  No change in hearing, vertigo.  No antibiotics in the past month.  No antipyretic in the past 6 hours.  He has tried warm compresses with improvement in his symptoms.  No aggravating factors.  Second, he also states that he has removable "white gunk" in the back of his throat, sandpaper sensation when he swallows, and itching in his throat.  He states this is identical to previous episodes of thrush and is requesting a refill of his nystatin.  Patient has a past medical history of asthma/COPD on chronic prednisone 20 mg daily, chronic pancreatitis, hyperlipidemia, seasonal allergic rhinitis, tachycardia, boils.  No history of frequent sinusitis, nasal polyps, MRSA.  He states that he is not on Flonase as it does not work for him.  Past Medical History:  Diagnosis Date   Asthma    Chronic idiopathic constipation    Chronic obstruct airways disease (HCC)    Chronic pancreatitis (HCC)    Degenerative disorder of muscle    Hearing loss    Hemorrhoids    Hyperlipidemia    Insomnia    Male erectile dysfunction    Nausea    Pancreas (digestive gland) works poorly    Seasonal allergic rhinitis    Vitamin D deficiency     Past Surgical History:  Procedure Laterality Date   PANCREAS SURGERY      Family History  Problem Relation Age of Onset   Lung cancer Mother    Pancreatic disease Father     Social History   Tobacco Use    Smoking status: Former    Types: Cigarettes   Smokeless tobacco: Never   Tobacco comments:    Patient does not know what year he quit smoking.  Vaping Use   Vaping status: Former  Substance Use Topics   Alcohol use: Yes   Drug use: No    No current facility-administered medications for this encounter.  Current Outpatient Medications:    albuterol (VENTOLIN HFA) 108 (90 Base) MCG/ACT inhaler, Inhale 2 puffs into the lungs every 4 (four) hours as needed for wheezing or shortness of breath., Disp: 36 g, Rfl: 6   aluminum-magnesium hydroxide-simethicone (MAALOX) 200-200-20 MG/5ML SUSP, Take 30 mLs by mouth 4 (four) times daily -  before meals and at bedtime., Disp: 1680 mL, Rfl: 1   amoxicillin-clavulanate (AUGMENTIN) 875-125 MG tablet, Take 1 tablet by mouth every 12 (twelve) hours for 10 days., Disp: 20 tablet, Rfl: 0   Budeson-Glycopyrrol-Formoterol (BREZTRI AEROSPHERE) 160-9-4.8 MCG/ACT AERO, Inhale 2 puffs into the lungs in the morning and at bedtime., Disp: 1 each, Rfl: 5   Budeson-Glycopyrrol-Formoterol (BREZTRI AEROSPHERE) 160-9-4.8 MCG/ACT AERO, Inhale 2 puffs into the lungs in the morning and at bedtime., Disp: , Rfl:    clonazePAM (KLONOPIN) 0.5 MG tablet, Take 0.5 mg by mouth 2 (two) times daily as needed., Disp: , Rfl:    doxycycline (VIBRAMYCIN) 100 MG capsule, Take 1 capsule (  100 mg total) by mouth 2 (two) times daily for 10 days., Disp: 20 capsule, Rfl: 0   famotidine (PEPCID) 20 MG tablet, Take 1 tablet (20 mg total) by mouth 2 (two) times daily., Disp: 60 tablet, Rfl: 0   furosemide (LASIX) 40 MG tablet, Take 40 mg by mouth 2 (two) times daily., Disp: , Rfl:    ipratropium (ATROVENT) 0.06 % nasal spray, Place 2 sprays into both nostrils 4 (four) times daily., Disp: 15 mL, Rfl: 12   KLOR-CON M20 20 MEQ tablet, Take 20 mEq by mouth daily., Disp: , Rfl:    LINZESS 290 MCG CAPS capsule, Take 290 mcg by mouth daily., Disp: , Rfl:    mometasone (NASONEX) 50 MCG/ACT nasal spray,  Place 2 sprays into the nose daily., Disp: 17 g, Rfl: 0   nystatin (MYCOSTATIN) 100000 UNIT/ML suspension, Take 5 mLs (500,000 Units total) by mouth 4 (four) times daily. Swish and swallow x 7-14 days. Retain in mouth as long as possible, Disp: 280 mL, Rfl: 0   ondansetron (ZOFRAN-ODT) 4 MG disintegrating tablet, Take 1 tablet (4 mg total) by mouth every 8 (eight) hours as needed for nausea or vomiting., Disp: 20 tablet, Rfl: 0   oxyCODONE (OXYCONTIN) 20 mg 12 hr tablet, Take 20 mg by mouth every 12 (twelve) hours., Disp: , Rfl:    PANCREAZE 40347 units CPEP, Take 1 capsule by mouth 3 (three) times daily., Disp: , Rfl: 5   pantoprazole (PROTONIX) 40 MG tablet, Take 40 mg by mouth daily., Disp: , Rfl:    predniSONE (DELTASONE) 20 MG tablet, TAKE 1/2 TABLET TWICE A DAY BY MOUTH, Disp: 30 tablet, Rfl: 2  Allergies  Allergen Reactions   Gabapentin Shortness Of Breath   Zithromax [Azithromycin] Rash    Patient states azithromycin caused him to "bleed"   Levaquin [Levofloxacin] Other (See Comments)    Tendon problem     ROS  As noted in HPI.   Physical Exam  BP 115/89 (BP Location: Left Arm)   Pulse (!) 120   Temp 99.1 F (37.3 C) (Oral)   Resp 16   Ht 6' (1.829 m)   Wt 115.2 kg   SpO2 96%   BMI 34.45 kg/m   Wt Readings from Last 3 Encounters:  10/12/23 115.2 kg  07/31/23 113.9 kg  07/20/23 112.9 kg   Temp Readings from Last 3 Encounters:  10/12/23 99.1 F (37.3 C) (Oral)  09/02/23 98.4 F (36.9 C) (Oral)  08/10/23 98.4 F (36.9 C) (Oral)   BP Readings from Last 3 Encounters:  10/12/23 115/89  09/02/23 108/73  08/10/23 119/87   Pulse Readings from Last 3 Encounters:  10/12/23 (!) 120  09/02/23 (!) 138  08/10/23 (!) 132     Constitutional: Well developed, well nourished, no acute distress Eyes:  EOMI, conjunctiva normal bilaterally PERRLA, no pain with EOMs. HENT: Normocephalic, atraumatic,mucus membranes moist Tender, blanchable area of erythema, edema,  induration over the right maxilla with some loss of nasolabial fold.  No fluctuance.  No swelling inferior to the eye.  Dentition absent.  Gingiva normal, nontender, no swelling.  Mild nasal congestion.  Normal turbinates.  No frontal sinus tenderness bilaterally. External ear, EAC normal.  No pain with traction on pinna, palpation of tragus or mastoid.unable to see TM due to cerumen.    Respiratory: Normal inspiratory effort Cardiovascular: Normal rate GI: nondistended skin: No rash, skin intact Musculoskeletal: no deformities Neurologic: Alert & oriented x 3, no focal neuro deficits.  Cranial nerves  III through XII intact. Psychiatric: Speech and behavior appropriate   ED Course   Medications  cefTRIAXone (ROCEPHIN) injection 1 g (1 g Intramuscular Given 10/12/23 1200)    Orders Placed This Encounter  Procedures   CBC with Differential    Standing Status:   Standing    Number of Occurrences:   1   Basic metabolic panel    Standing Status:   Standing    Number of Occurrences:   1   Recheck vitals    Standing Status:   Standing    Number of Occurrences:   1   Ear wax removal    R ear    Standing Status:   Standing    Number of Occurrences:   1    Results for orders placed or performed during the hospital encounter of 10/12/23 (from the past 24 hour(s))  CBC with Differential     Status: Abnormal   Collection Time: 10/12/23 11:53 AM  Result Value Ref Range   WBC 12.5 (H) 4.0 - 10.5 K/uL   RBC 4.30 4.22 - 5.81 MIL/uL   Hemoglobin 13.8 13.0 - 17.0 g/dL   HCT 40.9 81.1 - 91.4 %   MCV 99.3 80.0 - 100.0 fL   MCH 32.1 26.0 - 34.0 pg   MCHC 32.3 30.0 - 36.0 g/dL   RDW 78.2 95.6 - 21.3 %   Platelets 306 150 - 400 K/uL   nRBC 0.0 0.0 - 0.2 %   Neutrophils Relative % 88 %   Neutro Abs 10.9 (H) 1.7 - 7.7 K/uL   Lymphocytes Relative 6 %   Lymphs Abs 0.8 0.7 - 4.0 K/uL   Monocytes Relative 6 %   Monocytes Absolute 0.7 0.1 - 1.0 K/uL   Eosinophils Relative 0 %    Eosinophils Absolute 0.0 0.0 - 0.5 K/uL   Basophils Relative 0 %   Basophils Absolute 0.0 0.0 - 0.1 K/uL   Immature Granulocytes 0 %   Abs Immature Granulocytes 0.05 0.00 - 0.07 K/uL  Basic metabolic panel     Status: Abnormal   Collection Time: 10/12/23 11:53 AM  Result Value Ref Range   Sodium 138 135 - 145 mmol/L   Potassium 4.4 3.5 - 5.1 mmol/L   Chloride 100 98 - 111 mmol/L   CO2 28 22 - 32 mmol/L   Glucose, Bld 107 (H) 70 - 99 mg/dL   BUN 13 6 - 20 mg/dL   Creatinine, Ser 0.86 0.61 - 1.24 mg/dL   Calcium 9.3 8.9 - 57.8 mg/dL   GFR, Estimated >46 >96 mL/min   Anion gap 10 5 - 15   No results found.  ED Clinical Impression  1. Right facial swelling   2. Acute non-recurrent maxillary sinusitis   3. Impacted cerumen of right ear   4. Oral thrush   5. Medication refill      ED Assessment/Plan     Patient presents with an acute illness with systemic symptoms of tachycardia.  1.  Facial swelling.  Either severe maxillary sinusitis versus facial space abscess.  Doubt cavernous sinus thrombosis in the absence of headache.  Discussed with radiology.  he would need a CT maxillofacial with contrast to different shape between the sinusitis versus facial space abscess.  Discussed with patient that we could do that today, however, patient would like to try outpatient therapy.  Will check a CBC, basic metabolic panel for reference.  Patient has a mild leukocytosis with a left shift, but otherwise CBC is  normal.  Could be from the prednisone.  BMP normal.  Giving 1000 mg of Rocephin here.  Home with doxycycline for 10 days and Augmentin for 10 days. he is to go to the ER if not improving in 24 hours, sooner if he gets worse.  2.  Reports of esophageal thrush.  Will refill nystatin as he states this has worked well for him in the past.  3.  Tachycardia.  Initial heart rate 130, repeat 120.  Patient states that this is his baseline, that he is "always been this way".  Is not sure why.   Will defer workup to cardiology/PCP.   Spent 45 minutes in the care of this patient.  Discussed labs, MDM, treatment plan, and plan for follow-up with patient. Discussed sn/sx that should prompt return to the ED. patient agrees with plan.   Meds ordered this encounter  Medications   cefTRIAXone (ROCEPHIN) injection 1 g   nystatin (MYCOSTATIN) 100000 UNIT/ML suspension    Sig: Take 5 mLs (500,000 Units total) by mouth 4 (four) times daily. Swish and swallow x 7-14 days. Retain in mouth as long as possible    Dispense:  280 mL    Refill:  0   doxycycline (VIBRAMYCIN) 100 MG capsule    Sig: Take 1 capsule (100 mg total) by mouth 2 (two) times daily for 10 days.    Dispense:  20 capsule    Refill:  0   amoxicillin-clavulanate (AUGMENTIN) 875-125 MG tablet    Sig: Take 1 tablet by mouth every 12 (twelve) hours for 10 days.    Dispense:  20 tablet    Refill:  0   mometasone (NASONEX) 50 MCG/ACT nasal spray    Sig: Place 2 sprays into the nose daily.    Dispense:  17 g    Refill:  0      *This clinic note was created using Scientist, clinical (histocompatibility and immunogenetics). Therefore, there may be occasional mistakes despite careful proofreading.  ?    Domenick Gong, MD 10/13/23 [phone]

## 2023-10-12 NOTE — Discharge Instructions (Addendum)
I have given you 1000 mg of Rocephin here.  I am sending you home on Augmentin and doxycycline, which will cover a sinus and skin infection.  Go to the ER if you are not better in 24 hours, or if you get worse.  You will need a CT maxillofacial with contrast to evaluate for facial abscess versus severe sinusitis.  We can try some Nasonex, saline nasal irrigation with a Lloyd Huger Med rinse and distilled water as often as you want.  Continue warm compresses.  I am refilling your nystatin for your thrush.  We have irrigated your right ear out.  Follow-up with your primary care provider/cardiologist for further evaluation of your tachycardia.

## 2023-10-27 ENCOUNTER — Ambulatory Visit: Payer: Medicaid Other | Admitting: Urology

## 2023-11-15 ENCOUNTER — Ambulatory Visit: Payer: Medicare Other | Admitting: Primary Care

## 2023-11-27 ENCOUNTER — Ambulatory Visit
Admission: EM | Admit: 2023-11-27 | Discharge: 2023-11-27 | Disposition: A | Discharge status: Home / Self Care | Payer: Medicare Other | Attending: Family Medicine | Admitting: Family Medicine

## 2023-11-27 ENCOUNTER — Ambulatory Visit: Payer: Medicare Other

## 2023-11-27 ENCOUNTER — Encounter: Payer: Self-pay | Admitting: Emergency Medicine

## 2023-11-27 DIAGNOSIS — T59811A Toxic effect of smoke, accidental (unintentional), initial encounter: Secondary | ICD-10-CM

## 2023-11-27 DIAGNOSIS — R051 Acute cough: Secondary | ICD-10-CM

## 2023-11-27 DIAGNOSIS — J441 Chronic obstructive pulmonary disease with (acute) exacerbation: Secondary | ICD-10-CM | POA: Diagnosis not present

## 2023-11-27 MED ORDER — PREDNISONE 50 MG PO TABS: 50.0000 mg | ORAL_TABLET | Freq: Every day | ORAL | 0 refills | Status: AC

## 2023-11-27 MED ORDER — DOXYCYCLINE HYCLATE 100 MG PO CAPS: 100.0000 mg | ORAL_CAPSULE | Freq: Two times a day (BID) | ORAL | 0 refills | Status: DC

## 2023-11-27 MED ORDER — NYSTATIN 100000 UNIT/ML MT SUSP: 500000.0000 [IU] | Freq: Four times a day (QID) | OROMUCOSAL | 0 refills | Status: DC

## 2023-11-27 NOTE — Discharge Instructions (Addendum)
Stop by the pharmacy to pick up your prescriptions.  Follow up with your lung doctor.  Go to the emergency department/ER if your breathing gets worse.  Use your Duo-nebulizer ever 3-4 hours for the next 48 hours then as needed. Take prednisone with your daily prednisone for a true steroid burst.

## 2023-11-27 NOTE — ED Triage Notes (Signed)
Patient reports cough and chest congestion that earlier this week.  Patient reports he has had smoke outside his home which has irritated his lungs.  Patient also reports that he has oral thrush.
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
## 2023-12-12 ENCOUNTER — Encounter: Payer: Self-pay | Admitting: Emergency Medicine

## 2023-12-12 ENCOUNTER — Ambulatory Visit
Admission: EM | Admit: 2023-12-12 | Discharge: 2023-12-12 | Disposition: A | Discharge status: Home / Self Care | Payer: Medicare Other | Attending: Family Medicine | Admitting: Family Medicine

## 2023-12-12 DIAGNOSIS — T59811A Toxic effect of smoke, accidental (unintentional), initial encounter: Secondary | ICD-10-CM | POA: Diagnosis not present

## 2023-12-12 DIAGNOSIS — R0602 Shortness of breath: Secondary | ICD-10-CM | POA: Diagnosis not present

## 2023-12-12 DIAGNOSIS — J441 Chronic obstructive pulmonary disease with (acute) exacerbation: Secondary | ICD-10-CM

## 2023-12-12 MED ORDER — AMOXICILLIN-POT CLAVULANATE 875-125 MG PO TABS: 1.0000 | ORAL_TABLET | Freq: Two times a day (BID) | ORAL | 0 refills | Status: DC

## 2023-12-12 MED ORDER — NYSTATIN 100000 UNIT/ML MT SUSP: 500000.0000 [IU] | Freq: Four times a day (QID) | OROMUCOSAL | 0 refills | Status: AC

## 2023-12-12 MED ORDER — PREDNISONE 20 MG PO TABS: 40.0000 mg | ORAL_TABLET | Freq: Two times a day (BID) | ORAL | 0 refills | Status: AC
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
## 2023-12-12 NOTE — Discharge Instructions (Addendum)
Stop by the pharmacy to pick up your prescriptions.  Follow up with your primary care provider as needed.  
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
## 2023-12-28 ENCOUNTER — Emergency Department
Admission: EM | Admit: 2023-12-28 | Discharge: 2023-12-28 | Discharge status: Against Medical Advice | Payer: Medicare Other | Attending: Student | Admitting: Student

## 2023-12-28 ENCOUNTER — Other Ambulatory Visit: Payer: Self-pay

## 2023-12-28 ENCOUNTER — Emergency Department: Payer: Medicare Other

## 2023-12-28 DIAGNOSIS — R0602 Shortness of breath: Secondary | ICD-10-CM | POA: Diagnosis present

## 2023-12-28 DIAGNOSIS — R053 Chronic cough: Secondary | ICD-10-CM | POA: Diagnosis not present

## 2023-12-28 DIAGNOSIS — Z5321 Procedure and treatment not carried out due to patient leaving prior to being seen by health care provider: Secondary | ICD-10-CM | POA: Diagnosis not present

## 2023-12-28 DIAGNOSIS — R609 Edema, unspecified: Secondary | ICD-10-CM | POA: Insufficient documentation

## 2023-12-28 LAB — CBC WITH DIFFERENTIAL/PLATELET
Abs Immature Granulocytes: 0.22 10*3/uL — ABNORMAL HIGH (ref 0.00–0.07)
Basophils Absolute: 0 10*3/uL (ref 0.0–0.1)
Basophils Relative: 0 %
Eosinophils Absolute: 0 10*3/uL (ref 0.0–0.5)
Eosinophils Relative: 0 %
HCT: 41.7 % (ref 39.0–52.0)
Hemoglobin: 13.6 g/dL (ref 13.0–17.0)
Immature Granulocytes: 2 %
Lymphocytes Relative: 6 %
Lymphs Abs: 0.7 10*3/uL (ref 0.7–4.0)
MCH: 30.4 pg (ref 26.0–34.0)
MCHC: 32.6 g/dL (ref 30.0–36.0)
MCV: 93.3 fL (ref 80.0–100.0)
Monocytes Absolute: 0.6 10*3/uL (ref 0.1–1.0)
Monocytes Relative: 5 %
Neutro Abs: 9.8 10*3/uL — ABNORMAL HIGH (ref 1.7–7.7)
Neutrophils Relative %: 87 %
Platelets: 254 10*3/uL (ref 150–400)
RBC: 4.47 MIL/uL (ref 4.22–5.81)
RDW: 14.7 % (ref 11.5–15.5)
WBC: 11.3 10*3/uL — ABNORMAL HIGH (ref 4.0–10.5)
nRBC: 0 % (ref 0.0–0.2)

## 2023-12-28 LAB — BASIC METABOLIC PANEL
Anion gap: 14 (ref 5–15)
BUN: 20 mg/dL (ref 6–20)
CO2: 34 mmol/L — ABNORMAL HIGH (ref 22–32)
Calcium: 8.9 mg/dL (ref 8.9–10.3)
Chloride: 83 mmol/L — ABNORMAL LOW (ref 98–111)
Creatinine, Ser: 1.17 mg/dL (ref 0.61–1.24)
GFR, Estimated: >60 mL/min (ref >60)
Glucose, Bld: 112 mg/dL — ABNORMAL HIGH (ref 70–99)
Potassium: 4.2 mmol/L (ref 3.5–5.1)
Sodium: 131 mmol/L — ABNORMAL LOW (ref 135–145)

## 2023-12-28 LAB — BRAIN NATRIURETIC PEPTIDE: B Natriuretic Peptide: 66 pg/mL (ref 0.0–100.0)

## 2023-12-28 LAB — TROPONIN I (HIGH SENSITIVITY): Troponin I (High Sensitivity): 12 ng/L (ref <18)

## 2023-12-28 NOTE — ED Notes (Signed)
Called to repeat VS, no answer

## 2023-12-28 NOTE — ED Triage Notes (Signed)
Pt to ED for generalized and BLE Edema for "quite awhile". Reports worsening and increased shob started today.  States takes diuretics.  90% RA, placed on 2 L [place]

## 2023-12-28 NOTE — ED Provider Triage Note (Signed)
 Emergency Medicine Provider Triage Evaluation Note  Eugene Blackburn , a 52 y.o. male  was evaluated in triage.  Pt complains of leg swelling and SOB x several weeks, feels worse today. Denies history of CHF. No CP. Denies history of PE/DVT. Reports chronic cough, no change today.   Review of Systems  Positive: SOB, leg swelling Negative: Cp, fever  Physical Exam  There were no vitals taken for this visit. Gen:   Awake, no distress   Resp:  Normal effort  MSK:   Moves extremities without difficulty  Other:    Medical Decision Making  Medically screening exam initiated at 7:47 AM.  Appropriate orders placed.  Eugene Blackburn was informed that the remainder of the evaluation will be completed by another provider, this initial triage assessment does not replace that evaluation, and the importance of remaining in the ED until their evaluation is complete.     Sade Hollon E, PA-C 12/28/23 [phone]

## 2023-12-31 ENCOUNTER — Telehealth: Payer: Self-pay | Admitting: Pulmonary Disease

## 2024-01-01 NOTE — Telephone Encounter (Signed)
 PCCM Telephone Encounter After Hours  Patient followed by Dr. Annella at [facility] for COPD. Last seen by NP Hope on 07/20/23. On chronic prednisone  10 mg daily. Requesting refill for his prednisone . Briefly discussed side effects of chronic steroids (osteoporosis, HTN, hyperglycemia etc) and encouraged regular follow-up to monitor and manage. Will place order for two month supply and refills per Dr. Annella and NP Hope at next visit.

## 2024-01-06 ENCOUNTER — Other Ambulatory Visit: Payer: Self-pay

## 2024-01-06 ENCOUNTER — Emergency Department
Admission: EM | Admit: 2024-01-06 | Discharge: 2024-01-06 | Discharge status: Against Medical Advice | Payer: Medicare Other | Attending: Emergency Medicine | Admitting: Emergency Medicine

## 2024-01-06 ENCOUNTER — Emergency Department: Payer: Medicare Other

## 2024-01-06 DIAGNOSIS — Z5321 Procedure and treatment not carried out due to patient leaving prior to being seen by health care provider: Secondary | ICD-10-CM | POA: Diagnosis not present

## 2024-01-06 DIAGNOSIS — R600 Localized edema: Secondary | ICD-10-CM | POA: Diagnosis not present

## 2024-01-06 DIAGNOSIS — R0602 Shortness of breath: Secondary | ICD-10-CM | POA: Insufficient documentation

## 2024-01-06 LAB — BASIC METABOLIC PANEL
Anion gap: 11 (ref 5–15)
BUN: 25 mg/dL — ABNORMAL HIGH (ref 6–20)
CO2: 33 mmol/L — ABNORMAL HIGH (ref 22–32)
Calcium: 9.4 mg/dL (ref 8.9–10.3)
Chloride: 95 mmol/L — ABNORMAL LOW (ref 98–111)
Creatinine, Ser: 1.16 mg/dL (ref 0.61–1.24)
GFR, Estimated: >60 mL/min (ref >60)
Glucose, Bld: 136 mg/dL — ABNORMAL HIGH (ref 70–99)
Potassium: 4 mmol/L (ref 3.5–5.1)
Sodium: 139 mmol/L (ref 135–145)

## 2024-01-06 LAB — CBC
HCT: 47.5 % (ref 39.0–52.0)
Hemoglobin: 15.4 g/dL (ref 13.0–17.0)
MCH: 30.4 pg (ref 26.0–34.0)
MCHC: 32.4 g/dL (ref 30.0–36.0)
MCV: 93.7 fL (ref 80.0–100.0)
Platelets: 273 10*3/uL (ref 150–400)
RBC: 5.07 MIL/uL (ref 4.22–5.81)
RDW: 14.6 % (ref 11.5–15.5)
WBC: 12.7 10*3/uL — ABNORMAL HIGH (ref 4.0–10.5)
nRBC: 0 % (ref 0.0–0.2)

## 2024-01-06 LAB — BRAIN NATRIURETIC PEPTIDE: B Natriuretic Peptide: 34.9 pg/mL (ref 0.0–100.0)

## 2024-01-06 LAB — TROPONIN I (HIGH SENSITIVITY): Troponin I (High Sensitivity): 15 ng/L (ref <18)

## 2024-01-06 NOTE — ED Triage Notes (Addendum)
 Pt to ed from home via POV for generalized bilateral lower leg edema. Pt states  I think the lasix  is draining me of my vitamins and nutrients. Pt states I have more increased shortness of breath intermittently. Pt is caox4, in no acute distress and ambulatory in triage. Pt was seen on 12/31 for same but left AMA after being triaged. Pt does not take his lasix  daily. He takes it when he feels like it.

## 2024-01-08 NOTE — Telephone Encounter (Signed)
 Patient sent another mychart saying to disregard the prior message sent as he has an appt scheduled 1/13. Nothing further needed.

## 2024-01-08 NOTE — Telephone Encounter (Signed)
 Please see mycharts sent by pt and advise.

## 2024-01-10 ENCOUNTER — Ambulatory Visit: Payer: Medicare Other | Admitting: Primary Care

## 2024-01-10 ENCOUNTER — Encounter: Payer: Self-pay | Admitting: Primary Care

## 2024-01-10 VITALS — BP 119/85 | HR 118 | Temp 97.3°F | Ht 72.0 in | Wt 238.0 lb

## 2024-01-10 DIAGNOSIS — J441 Chronic obstructive pulmonary disease with (acute) exacerbation: Secondary | ICD-10-CM

## 2024-01-10 DIAGNOSIS — K2100 Gastro-esophageal reflux disease with esophagitis, without bleeding: Secondary | ICD-10-CM

## 2024-01-10 DIAGNOSIS — J439 Emphysema, unspecified: Secondary | ICD-10-CM

## 2024-01-10 DIAGNOSIS — J4550 Severe persistent asthma, uncomplicated: Secondary | ICD-10-CM | POA: Diagnosis not present

## 2024-01-10 DIAGNOSIS — J4489 Other specified chronic obstructive pulmonary disease: Secondary | ICD-10-CM

## 2024-01-10 MED ORDER — DOXYCYCLINE HYCLATE 100 MG PO TABS: 100.0000 mg | ORAL_TABLET | Freq: Two times a day (BID) | ORAL | 0 refills | Status: DC
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
## 2024-01-17 ENCOUNTER — Telehealth: Payer: Self-pay | Admitting: Pharmacist

## 2024-01-17 NOTE — Telephone Encounter (Signed)
Received [employer] new start paperwork. Faxed to [employer] with clinicals. No insurance card in media tab of chart  Phone#: [phone] Fax#: [phone]

## 2024-01-18 NOTE — Telephone Encounter (Signed)
Received fax from [facility] stating patient's Sharyn Blitz rx is being triaged to [facility] (phone [phone]). Pharmacy will run benefits and be in contact with patient once ready to schedule. If patient unable to afford medication through insurance, pharmacy should be triaging his case back to [facility]  Patient ID: [id number]  Chesley Mires, PharmD, MPH, BCPS, CPP Clinical Pharmacist (Rheumatology and Pulmonology)

## 2024-01-19 ENCOUNTER — Other Ambulatory Visit: Payer: Self-pay | Admitting: Pulmonary Disease

## 2024-01-21 MED ORDER — PREDNISONE 20 MG PO TABS: ORAL_TABLET | ORAL | 1 refills | Status: DC

## 2024-01-28 NOTE — Telephone Encounter (Signed)
Pharmacy states that they need a prescription for the prednisone to include the taper in order to fill. He had increased and provider is aware but unable to get a refill to replace those with current directions. The directions for taper are in note but not under medications. Please advise.

## 2024-03-25 ENCOUNTER — Other Ambulatory Visit: Payer: Self-pay | Admitting: Primary Care

## 2024-03-27 MED ORDER — PREDNISONE 20 MG PO TABS: ORAL_TABLET | ORAL | 1 refills | Status: DC

## 2024-04-03 ENCOUNTER — Ambulatory Visit: Payer: Medicare Other | Admitting: Primary Care

## 2024-04-11 ENCOUNTER — Other Ambulatory Visit: Payer: Self-pay | Admitting: Neurology

## 2024-04-11 DIAGNOSIS — R5383 Other fatigue: Secondary | ICD-10-CM

## 2024-04-11 DIAGNOSIS — R29898 Other symptoms and signs involving the musculoskeletal system: Secondary | ICD-10-CM

## 2024-04-11 DIAGNOSIS — R42 Dizziness and giddiness: Secondary | ICD-10-CM

## 2024-04-16 ENCOUNTER — Other Ambulatory Visit: Payer: Self-pay

## 2024-04-16 ENCOUNTER — Ambulatory Visit: Admission: RE | Admit: 2024-04-16 | Source: Ambulatory Visit

## 2024-04-16 ENCOUNTER — Ambulatory Visit
Admission: EM | Admit: 2024-04-16 | Discharge: 2024-04-16 | Disposition: A | Discharge status: Home / Self Care | Attending: Emergency Medicine | Admitting: Emergency Medicine

## 2024-04-16 ENCOUNTER — Encounter: Payer: Self-pay | Admitting: Emergency Medicine

## 2024-04-16 DIAGNOSIS — L0231 Cutaneous abscess of buttock: Secondary | ICD-10-CM | POA: Diagnosis not present

## 2024-04-16 MED ORDER — DOXYCYCLINE HYCLATE 100 MG PO CAPS: 100.0000 mg | ORAL_CAPSULE | Freq: Two times a day (BID) | ORAL | 0 refills | Status: AC

## 2024-04-16 NOTE — ED Provider Notes (Signed)
 [facility]    CSN: [id number] Arrival date & time: 04/16/24  0807      History   Chief Complaint Chief Complaint  Patient presents with   Abscess    HPI Eugene Blackburn is a 53 y.o. male.   HPI  53 year old male with past medical history significant for recurrent abscesses, asthma, chronic pancreatitis, hyperlipidemia, COPD, and vitamin D deficiency presents for evaluation of a swollen tender area on his left buttock that has been present for the last 4 days.  He reports that as a young man he was bitten this area by a dog and that he has had multiple recurrent abscesses in the same area since that time.  He denies any fever but states that he did begin to become nauseous last night.  He did attempt to lance the area on his own but was unsuccessful.  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 Authorizing Provider  doxycycline  (VIBRAMYCIN ) 100 MG capsule Take 1 capsule (100 mg total) by mouth 2 (two) times daily for 7 days. 04/16/24 04/23/24 Yes Kent Pear, NP  albuterol  (VENTOLIN  HFA) 108 [phone] Base) MCG/ACT inhaler Inhale 2 puffs into the lungs every 4 (four) hours as needed for wheezing or shortness of breath. 07/20/23    Antonio Baumgarten, NP  aluminum -magnesium  hydroxide-simethicone  (MAALOX) 200-200-20 MG/5ML SUSP Take 30 mLs by mouth 4 (four) times daily -  before meals and at bedtime. 06/24/23   Reena Canning, NP  Budeson-Glycopyrrol-Formoterol (BREZTRI  AEROSPHERE) 160-9-4.8 MCG/ACT AERO Inhale 2 puffs into the lungs in the morning and at bedtime. 07/20/23   Antonio Baumgarten, NP  Budeson-Glycopyrrol-Formoterol (BREZTRI  AEROSPHERE) 160-9-4.8 MCG/ACT AERO Inhale 2 puffs into the lungs in the morning and at bedtime. 07/20/23   Antonio Baumgarten, NP  clonazePAM (KLONOPIN) 0.5 MG tablet Take 0.5 mg by mouth 2 (two) times daily as needed. 01/14/22   [provider]  famotidine  (PEPCID ) 20 MG tablet Take 1 tablet (20 mg total) by mouth 2 (two) times daily. 06/24/23   White, Maybelle Spatz, NP  furosemide  (LASIX ) 40 MG tablet Take 40 mg by mouth 2 (two) times daily. 05/02/23   [provider]  ipratropium (ATROVENT ) 0.06 % nasal spray Place 2 sprays into both nostrils 4 (four) times daily. 03/12/22   Kent Pear, NP  KLOR-CON M20 20 MEQ tablet Take 20 mEq by mouth daily. 04/17/23   [provider]  LINZESS 290 MCG CAPS capsule Take 290 mcg by mouth daily. 04/23/23   [provider]  mometasone  (NASONEX ) 50 MCG/ACT nasal spray Place 2 sprays  into the nose daily. 10/12/23   Ethlyn Herd, MD  nystatin  (MYCOSTATIN ) 100000 UNIT/ML suspension Take 5 mLs (500,000 Units total) by mouth 4 (four) times daily. Swish and swallow x 7-14 days. Retain in mouth as long as possible 12/12/23   Brimage, Alean Amen, DO  ondansetron  (ZOFRAN -ODT) 4 MG disintegrating tablet Take 1 tablet (4 mg total) by mouth every 8 (eight) hours as needed for nausea or vomiting. 06/24/23   Reena Canning, NP  oxyCODONE (OXYCONTIN) 20 mg 12 hr tablet Take 20 mg by mouth every 12 (twelve) hours.    [provider]  PANCREAZE 16800 units CPEP Take 1 capsule by mouth 3 (three) times daily. 02/01/18   [provider]   pantoprazole  (PROTONIX ) 40 MG tablet Take 40 mg by mouth daily.    [provider]  predniSONE  (DELTASONE ) 20 MG tablet TAKE 1/2 TABLET TWICE A DAY BY MOUTH 03/27/24   Parrett, Macdonald Savoy, NP    Family History Family History  Problem Relation Age of Onset   Lung cancer Mother    Pancreatic disease Father     Social History Social History   Tobacco Use   Smoking status: Former    Types: Cigarettes   Smokeless tobacco: Never   Tobacco comments:    Patient does not know what year he quit smoking.  Vaping Use   Vaping status: Former  Substance Use Topics   Alcohol use: Yes   Drug use: No     Allergies   Gabapentin, Zithromax [azithromycin], and Levaquin  [levofloxacin ]   Review of Systems Review of Systems  Constitutional:  Negative for fever.  Gastrointestinal:  Positive for nausea.  Skin:  Positive for color change and wound.     Physical Exam Triage Vital Signs ED Triage Vitals  Encounter Vitals Group     BP      Systolic BP Percentile      Diastolic BP Percentile      Pulse      Resp      Temp      Temp src      SpO2      Weight      Height      Head Circumference      Peak Flow      Pain Score      Pain Loc      Pain Education      Exclude from Growth Chart    No data found.  Updated Vital Signs BP 108/74 (BP Location: Right Arm)   Pulse 92   Temp 99 F (37.2 C) (Oral)   Resp 18   SpO2 92%   Visual Acuity Right Eye Distance:   Left Eye Distance:   Bilateral Distance:    Right Eye Near:   Left Eye Near:    Bilateral Near:     Physical Exam Vitals and nursing note reviewed.  Constitutional:      Appearance: Normal appearance.  Skin:    General: Skin is warm and dry.     Capillary Refill: Capillary refill takes less than 2 seconds.     Findings: Erythema and lesion present.  Neurological:     General: No focal deficit present.     Mental Status: He is alert and oriented to person, place, and time.      UC Treatments /  Results  Labs (all labs ordered are listed, but only abnormal results are displayed) Labs Reviewed - No data to display  EKG  Radiology No results found.  Procedures Procedures (including critical care time)  Medications Ordered in UC Medications - No data to display  Initial Impression / Assessment and Plan / UC Course  I have reviewed the triage vital signs and the nursing notes.  Pertinent labs & imaging results that were available during my care of the patient were reviewed by me and considered in my medical decision making (see chart for details).   Patient is a nontoxic-appearing 53 year old male presenting for evaluation of a large abscess on his left buttock that has been present for last 4 days as outlined HPI above.  As using image above, the area of erythema encompasses the near entirety of the left buttock with marked induration.  There is a golf ball sized area of fluctuance in the center of the erythema which could represent a drainable fluid collection.  The abscess measures 12 cm x 14 cm.  The wound was cleansed with alcohol and anesthetized with 3 mL of 1% lidocaine with epi.  Once good anesthesia was achieved the wound was cleansed with chlorhexidine and saline, draped in a sterile fashion, and a 2 cm horizontal incision was made which resulted in the eruption of approximately 5 mL of serosanguineous pus.  The wound was deloculated using hemostats and another 2 mL of serosanguineous pus was expressed from the surgical tract.  A 15 cm packing strip was inserted and the wound was dressed with 4 x 4 gauze and Mefix tape.  Patient tolerated procedure well.  I will discharge the patient home with a diagnosis of left buttock abscess on doxycycline  100 mg twice daily for 7 days because I want to cover for potential MRSA given that patient has a history of recurrent abscesses.  He may use over-the-counter Tylenol and/or ibuprofen  as needed for pain.  I advised him to apply warm  compresses to his left buttock to facilitate drainage of the remainder of the infection.  He should return in 48 hours for wound check and packing removal or exchange.  ER precautions reviewed.   Final Clinical Impressions(s) / UC Diagnoses   Final diagnoses:  Abscess of buttock, left     Discharge Instructions      Leave the dressing in place for the next 24 hours and only change it if it becomes saturated.  After 24 hours you can gently remove the dressing being sure to not remove the packing strip, wash your buttock gently with warm water and soap, pat it dry, and apply a new dressing to the area.  You need to apply continuous warm compresses to your left buttock to facilitate mobilization of the infection from the tissues so it can drain out of the surgical tract.  You need to return in 48 hours for a wound check and packing removal or exchange.  If you develop any increased redness, swelling, pain, nausea or vomiting or you cannot keep down your medications, or fever you need to go to the ER for evaluation.     ED Prescriptions     Medication Sig Dispense Auth. Provider   doxycycline  (VIBRAMYCIN ) 100 MG capsule Take 1 capsule (100 mg total) by mouth 2 (two) times daily for 7 days. 14 capsule Kent Pear, NP      PDMP not reviewed this encounter.   Kent Pear, NP 04/16/24 [phone]

## 2024-04-16 NOTE — Discharge Instructions (Signed)
 Leave the dressing in place for the next 24 hours and only change it if it becomes saturated.  After 24 hours you can gently remove the dressing being sure to not remove the packing strip, wash your buttock gently with warm water and soap, pat it dry, and apply a new dressing to the area.  You need to apply continuous warm compresses to your left buttock to facilitate mobilization of the infection from the tissues so it can drain out of the surgical tract.  You need to return in 48 hours for a wound check and packing removal or exchange.  If you develop any increased redness, swelling, pain, nausea or vomiting or you cannot keep down your medications, or fever you need to go to the ER for evaluation.

## 2024-04-16 NOTE — ED Triage Notes (Signed)
 Pt here for abscess to left buttocks x 4 days with pain and swelling; pt sts hx of similar in past

## 2024-05-03 ENCOUNTER — Ambulatory Visit
Admission: RE | Admit: 2024-05-03 | Discharge: 2024-05-03 | Disposition: A | Discharge status: Home / Self Care | Source: Ambulatory Visit | Attending: Neurology | Admitting: Neurology

## 2024-05-03 DIAGNOSIS — R42 Dizziness and giddiness: Secondary | ICD-10-CM

## 2024-05-03 DIAGNOSIS — R5383 Other fatigue: Secondary | ICD-10-CM

## 2024-05-03 DIAGNOSIS — R29898 Other symptoms and signs involving the musculoskeletal system: Secondary | ICD-10-CM

## 2024-05-10 NOTE — Therapy (Incomplete)
 OUTPATIENT PHYSICAL THERAPY VESTIBULAR EVALUATION  Patient Name: Eugene Blackburn MRN: [id number] DOB:1971/06/23, 53 y.o., male Today's Date: 05/10/2024  END OF SESSION:   Past Medical History:  Diagnosis Date   Asthma    Chronic idiopathic constipation    Chronic obstruct airways disease (HCC)    Chronic pancreatitis (HCC)    Degenerative disorder of muscle    Hearing loss    Hemorrhoids    Hyperlipidemia    Insomnia    Male erectile dysfunction    Nausea    Pancreas (digestive gland) works poorly    Seasonal allergic rhinitis    Vitamin D deficiency    Past Surgical History:  Procedure Laterality Date   PANCREAS SURGERY     Patient Active Problem List   Diagnosis Date Noted   COPD with chronic bronchitis and emphysema (HCC) 07/26/2023   Tremor 01/26/2019   Numbness and tingling of both legs 01/26/2019   Abdominal pain 12/29/2003   PCP: Sharyne Degree, FNP  REFERRING PROVIDER: Duana Germany*   REFERRING DIAG: R42 (ICD-10-CM) - Dizziness  RATIONALE FOR EVALUATION AND TREATMENT: Rehabilitation  THERAPY DIAG: Dizziness and giddiness  ONSET DATE: ***  FOLLOW-UP APPT SCHEDULED WITH REFERRING PROVIDER: {yes/no:20286}    SUBJECTIVE:   Chief Complaint:  ***  Pertinent History recurrent abscesses, asthma, chronic pancreatitis, hyperlipidemia, COPD, and vitamin D deficiencyT  he patient, with a history of esophageal thrush, tachycardia, and long COVID, presents with lightheadedness, fatigue, frequent falls, and facial swelling. The lightheadedness began in January 2023, lasting for two minutes and occurring only while moving. The patient also reports facial swelling, which was evaluated in the emergency department in October 2024. The patient deferred a recommended CT maxillofacial with contrast and opted for outpatient therapy.  The patient has been on prednisone  for almost two years due to breathing issues from long COVID. He has been trying to wean  off the medication, currently taking about 5mg  every other day. If the patient does not take the medication, he experiences joint pain. The patient believes the facial swelling may be due to the prolonged use of prednisone .  In the past six to eight months, the patient has experienced episodes where he has to quickly sit down after walking a short distance due to a sudden loss of energy in his legs. During these episodes, the patient's arms and legs start wobbling, and he feels like he is having a seizure. The patient remains alert during these episodes and regains energy in his legs after about one and a half to two minutes. The patient has also noticed numbness in the top part of his leg since trying to wean off prednisone .  Disease Summary: (Aggregate of information from previous visits)   TREMOR Patient states that he has had a tremor for about 1 year. States that it is worse at sometimes and causes him to drop things. Father and both grandfathers had Parkinson's disease. Patient states he also has body aches and stiffness. Was told he has degenerative muscle disease. States that he has noted some difficulty swallowing. Has not been diagnosed with Parkinson's disease. Patient states tremor is on both sides. States he notices more when he tries to use his hands. Has tremor at rest as well. Has not been on any medication for tremors. Taking clonazepam for anxiety.   TINGLING IN THE LEGS Patient states he has tingling in both legs. This has been occurring for "a while". States that he also has tingling and jerking in his  feet. He cannot control jerking. Has chronic pancreatitis. Patient doesn't have diabetes. Patient says the jerking is like a muscle spasm. He gets concerned that one of these episodes will occur when he is driving and he would cause an accident.Lightheadedness and fatigue with frequent near falls Intermittent lightheadedness, fatigue, and leg weakness causing near falls. Episodes last  1.5-2 minutes with alertness. Possible causes: prednisone  tapering, vestibular issues, blood pressure changes, or neurological causes. Temporary hearing loss and tinnitus during episodes. Vestibular rehabilitation recommended. We considered and discussed conditions that may cause vertigo or dizziness, such as:  Benign paroxysmal positional vertigo (BPPV) - BPPV, sometimes called benign positional vertigo.  Meniere disease - Meniere disease is a condition that causes repeated spells of vertigo, hearing loss, and ringing in the ears. Spells can last several minutes or hours. It is probably caused by a buildup of fluid in the inner ear.  Vestibular neuritis - is probably caused by a virus that causes swelling around the balance nerve. People with vestibular neuritis develop sudden, severe vertigo, nausea, vomiting, and difficulty walking or standing up; these problems can last several days. Some people also develop difficulty hearing in one ear. Migraines - In a condition called vestibular migraine or migrainous vertigo, vertigo can be caused by a migraine. This type of vertigo usually happens along with a headache, but some patients have migraines without a headache.  Other brain problems - Stroke or TIA (transient ischemic attack), bleeding in the brain, head injury, or multiple sclerosis can also cause vertigo. There are usually other symptoms, besides vertigo, that happens with these brain problems. Certain medications can also cause dizziness or vertigo.  There are a few interventions the patient should consider to prevent falls: Be careful, Avoid high heel footwear, Use appropriate assistive device, Avoid hazards in your path, (toys, pets, wires, etc.), Have good light when you walk, Use a nonskid shower mat/shower chair, Use handrails and grab bars. Check out www.stopfalls.org for more information.  - Order vestibular rehabilitation. - Refer to ENT  - Order brain MRI with and without contrast. -  Perform sleep-deprived, routine EEG to rule out seizures. - Order TSH (stop taking biotin supplements one day prior), Vit B12, Vit D. (These labs are done to rule out systemic causes of neurological symptoms.)  Tinnitus during episodes of dizziness Tinnitus with temporary hearing loss during dizziness episodes. Possible vestibular or neurological cause. - Refer to ENT for tinnitus evaluation.  Facial swelling Persistent right-sided facial swelling, possibly due to fluid retention from prednisone . Differential includes fluid buildup or ENT-related issues. - Refer to ENT for facial swelling evaluation.  Tremors with family history of Parkinson's disease Intermittent tremors with speech difficulty and fatigue. Family history of Parkinson's disease. Differential includes Parkinson's or other neurological disorders. - Perform alpha-synuclein biopsy for Parkinson's assessment.  Prednisone  tapering effects Side effects from prednisone  tapering include dizziness and joint pain. Currently on 5 mg every other day. Tapering likely contributes to symptoms, requiring monitoring.   Description of dizziness: Frequency:  Duration: Symptom nature: {DizzyNature:27369} Progression of symptoms since onset: {DizzyProgress:27370} History of similar episodes: {yes/no:20286}  Provocative Factors: Easing Factors:  Auditory complaints (tinnitus, pain, drainage, hearing loss, aural fullness): {yes/no:20286} Vision changes (diplopia, visual field loss, recent changes, recent eye exam): {yes/no:20286} Chest pain/palpitations: {yes/no:20286} History of head injury/concussion: {yes/no:20286} Stress/anxiety: {yes/no:20286} Migraines/headaches: {yes/no:20286} Nausea/vomiting: {yes/no:20286} Numbness/tingling: {yes/no:20286} Focal weakness: {yes/no:20286} Dysarthria/dysphagia/drop attacks: {yes/no:20286}  Has patient fallen in last 6 months? {yes/no:20286}, Number of falls: *** Pertinent pain:  {yes/no:20286} Dominant hand: {RIGHT/LEFT:20294}  Imaging: {yes/no:20286}  Prior level of function: {PLOF:24004} Occupational demands: Hobbies:  Red Flags: Negative for bowel/bladder changes, personal history of cancer, chills/fever, night sweats, unexplained weight loss/gain  PRECAUTIONS: {Therapy precautions:24002}  WEIGHT BEARING RESTRICTIONS {Yes ***/No:24003}  LIVING ENVIRONMENT: Lives with: {[facility] lives with:25569::"lives with their family"} Lives in: {Lives in:25570} Stairs: {yes/no:20286}; {Stairs:24000} Has following equipment at home: {Assistive devices:23999}  PATIENT GOALS ***   OBJECTIVE EXAMINATION  POSTURE: No gross deficits contributing to symptoms  NEUROLOGICAL SCREEN: (2+ unless otherwise noted.) N=normal  Ab=abnormal  Level Dermatome R L Myotome R L Reflex R L  C3 Anterior Neck N N Sidebend C2-3 N N Jaw CN V    C4 Top of Shoulder N N Shoulder Shrug C4 N N Hoffman's UMN    C5 Lateral Upper Arm N N Shoulder ABD C4-5 N N Biceps C5-6    C6 Lateral Arm/ Thumb N N Arm Flex/ Wrist Ext C5-6 N N Brachiorad. C5-6    C7 Middle Finger N N Arm Ext//Wrist Flex C6-7 N N Triceps C7    C8 4th & 5th Finger N N Flex/ Ext Carpi Ulnaris C8 N N Patellar (L3-4)    T1 Medial Arm N N Interossei T1 N N Gastrocnemius    L2 Medial thigh/groin N N Illiopsoas (L2-3) N N     L3 Lower thigh/med.knee N N Quadriceps (L3-4) N N     L4 Medial leg/lat thigh N N Tibialis Ant (L4-5) N N     L5 Lat. leg & dorsal foot N N EHL (L5) N N     S1 post/lat foot/thigh/leg N N Gastrocnemius (S1-2) N N     S2 Post./med. thigh & leg N N Hamstrings (L4-S3) N N      CRANIAL NERVES II, III, IV, VI: Pupils equal and reactive to light, visual acuity and visual fields are intact, extraocular muscles are intact  V: Facial sensation is intact and symmetric bilaterally  VII: Facial strength is intact and symmetric bilaterally  VIII: Hearing is normal as tested by gross conversation IX, X: Palate elevates  midline, normal phonation, uvula midline XI: Shoulder shrug strength is intact  XII: Tongue protrudes midline   COORDINATION Finger to Nose: Normal Heel to Shin: Normal Pronator Drift: Negative Rapid Alternating Movements: Normal Finger to Thumb Opposition: Normal   RANGE OF MOTION Cervical Spine AROM WFL and painless in all planes. No functional focal deficits in AROM noted in BUE/BLE  MANUAL MUSCLE TESTING BUE/BLE strength WNL without focal deficits  TRANSFERS/GAIT Independent for transfers and ambulation without assistive device   PATIENT SURVEYS FOTO: ,predicted improvement to  ABC: % DHI: /100  OCULOMOTOR / VESTIBULAR TESTING  Oculomotor Exam- Room Light  Findings Comments  Ocular Alignment {normal/abnormal/not examined:14677}   Ocular ROM {normal/abnormal/not examined:14677}   Spontaneous Nystagmus {normal/abnormal/not examined:14677}   Gaze-Holding Nystagmus {normal/abnormal/not examined:14677}   End-Gaze Nystagmus {normal/abnormal/not examined:14677}   Vergence (normal 2-3") {normal/abnormal/not examined:14677}   Smooth Pursuit {normal/abnormal/not examined:14677}   Cross-Cover Test {normal/abnormal/not examined:14677}   Saccades {normal/abnormal/not examined:14677}   VOR Cancellation {normal/abnormal/not examined:14677}   Left Head Impulse {normal/abnormal/not examined:14677}   Right Head Impulse {normal/abnormal/not examined:14677}   Static Acuity {normal/abnormal/not examined:14677}   Dynamic Acuity {normal/abnormal/not examined:14677}    Oculomotor Exam- Fixation Suppressed  Findings Comments  Ocular Alignment {normal/abnormal/not examined:14677}   Spontaneous Nystagmus {normal/abnormal/not examined:14677}   Gaze-Holding Nystagmus {normal/abnormal/not examined:14677}   End-Gaze Nystagmus {normal/abnormal/not examined:14677}   Head Shaking Nystagmus {normal/abnormal/not examined:14677}   Pressure-Induced Nystagmus {normal/abnormal/not examined:14677}    Hyperventilation Induced  Nystagmus {normal/abnormal/not examined:14677}   Skull Vibration Induced Nystagmus {normal/abnormal/not examined:14677}    BPPV TESTS:  Symptoms Duration Intensity Nystagmus  L Dix-Hallpike None   None  R Dix-Hallpike None   None  L Head Roll None   None  R Head Roll None   None  L Sidelying Test      R Sidelying Test      (blank = not tested)  Clinical Test of Sensory Interaction for Balance (CTSIB): CONDITION TIME SWAY  Eyes open, firm surface 30 seconds   Eyes closed, firm surface 30 seconds   Eyes open, foam surface 30 seconds   Eyes closed, foam surface 30 seconds     FUNCTIONAL OUTCOME MEASURES  Results Comments  BERG    DGI    FGA    TUG    5TSTS    6 Minute Walk Test    10 Meter Gait Speed    (blank = not tested)   TODAY'S TREATMENT  Deferred   PATIENT EDUCATION:  Education details: Plan of care Person educated: Patient Education method: Explanation Education comprehension: verbalized understanding   HOME EXERCISE PROGRAM:  None currently   ASSESSMENT: CLINICAL IMPRESSION: Patient is a *** y.o. *** who was seen today for physical therapy evaluation and treatment for ***.   OBJECTIVE IMPAIRMENTS: {opptimpairments:25111}.   ACTIVITY LIMITATIONS: {activitylimitations:27494}  PARTICIPATION LIMITATIONS: {participationrestrictions:25113}  PERSONAL FACTORS: {Personal factors:25162} are also affecting patient's functional outcome.   REHAB POTENTIAL: {rehabpotential:25112}  CLINICAL DECISION MAKING: {clinical decision making:25114}  EVALUATION COMPLEXITY: {Evaluation complexity:25115}   GOALS:  SHORT TERM GOALS: Target date: {follow up:25551}  Pt will be independent with HEP for dizziness in order to decrease symptoms, improve balance,decrease fall risk, and improve function at home. Baseline: Goal status: INITIAL   LONG TERM GOALS: Target date: {follow up:25551}  Pt will increase FOTO to at least *** to  demonstrate significant improvement in function at home related to dizziness.  Baseline:  Goal status: {GOALSTATUS:25110}  2.  Pt will decrease DHI score by at least 18 points in order to demonstrate clinically significant reduction in disability related to dizziness.  Baseline: *** Goal status: {GOALSTATUS:25110}  3.  Pt will improve ABC by at least 13% in order to demonstrate clinically significant improvement in balance confidence.      Baseline: *** Goal status: {GOALSTATUS:25110}  4. Pt will decrease 5TSTS by at least 3 seconds in order to demonstrate clinically significant improvement in LE strength      Baseline: *** Goal status: {GOALSTATUS:25110}  5. Pt will improve DGI by at least 3 points in order to demonstrate clinically significant improvement in balance and decreased risk for falls.     Baseline: *** Goal status: {GOALSTATUS:25110}  6. Pt will decrease TUG to below 14 seconds/decrease in order to demonstrate decreased fall risk.  Baseline: *** Goal status: {GOALSTATUS:25110}  7. Pt will increase by at least 32m (116ft) in order to demonstrate clinically significant improvement in cardiopulmonary endurance and community ambulation   Baseline: *** Goal status: {GOALSTATUS:25110}   PLAN: PT FREQUENCY: 1x/week  PT DURATION: 8 weeks  PLANNED INTERVENTIONS: Therapeutic exercises, Therapeutic activity, Neuromuscular re-education, Balance training, Gait training, Patient/Family education, Self Care, Joint mobilization, Joint manipulation, Vestibular training, Canalith repositioning, Orthotic/Fit training, DME instructions, Dry Needling, Electrical stimulation, Spinal manipulation, Spinal mobilization, Cryotherapy, Moist heat, Taping, Traction, Ultrasound, Ionotophoresis 4mg /ml Dexamethasone, Manual therapy, and Re-evaluation.  PLAN FOR NEXT SESSION: ***   Sherill Ding Mayci Haning PT, DPT, GCS  Raegen Tarpley, PT 05/10/2024, 10:50 AM

## 2024-05-11 ENCOUNTER — Ambulatory Visit

## 2024-05-16 NOTE — Telephone Encounter (Signed)
 FYI

## 2024-05-18 ENCOUNTER — Telehealth: Payer: Self-pay | Admitting: *Deleted

## 2024-05-18 NOTE — Telephone Encounter (Signed)
 Received phone call from patient's PCP office, patient was on their schedule today and the FNP, Eugene Blackburn wanted to speak to Eugene Blackburn regarding his prednisone .  He told them that he was told to go to his PCP office.  I advised Eugene Blackburn that we last saw him in January, he was supposed to follow up in 8-12 weeks and he cancelled 2 appointments.  I let her know that we taper our patient's off of prednisone  in our office.  She said the patient is wanting to be tapered off of the prednisone  and the FNP wanted to talk to Eugene Blackburn about how to go about doing this.  He is currently on 5 mg daily.  I let her know that Eugene Blackburn is not here this afternoon, however, I would get a message to her and she would be back in the morning.  I advised that the patient needed to call and schedule a follow up with our office.  She said she would relay the information to her provider.  Eugene Blackburn,  You saw this patient back in January and he was on prednisone  and unable to be tapered down to 20 mg of prednisone  per you office note.  He is currently on 5 mg from what he told the staff at his PCP office.  The FNP, Eugene Blackburn wanted to discuss how to taper him off of the prednisone .  He told them that our office told him to go to his PCP office so that is what he did.  I let her know that we taper our patients off of prednisone  through our office.  I advised them that he cancelled he follow up appointments with our office and needs to call to reschedule.  Please advise.  Thank you.

## 2024-05-19 MED ORDER — PREDNISONE 5 MG PO TABS: ORAL_TABLET | ORAL | 0 refills | Status: DC

## 2024-05-19 NOTE — Telephone Encounter (Signed)
 I started him on NEBULIZER called Ohtuvayre  in hopes to improve his breathing and allow us  to taper off prednisone .   Other options is starting biologic called Tezspire , previous he did not want to do this per Dr. Ames Justin last OV. He should discuss with us  in person  To taper prednisone  to would go down to 15mg  x 2 weeks; then 10mg  x 2 weeks then try tapering 5mg  until follow-up

## 2024-05-19 NOTE — Telephone Encounter (Signed)
 Called and spoke with patient, he states his breathing has been doing well lately and he is only using the inhaler as needed.  He said while he was seeing his PCP he was told he was having swelling in both of his legs he came off of the prednisone  too quickly and his body was not producing cortizole.  I provided the information per Eulas Hick NP and scheduled him to f/u in the office on 06/14/24 at 1:30 pm, advised to arrive by 1:15 pm for check.  I asked that he call the office if things are not getting better or getting worse so it can be addressed.  He verbalized understanding.  Prescription for prednisone  sent to patient's pharmacy after verifying pharmacy.  Nothing further needed.

## 2024-05-19 NOTE — Telephone Encounter (Signed)
 Please advise on Prednisone  rf

## 2024-05-21 ENCOUNTER — Other Ambulatory Visit: Payer: Self-pay | Admitting: Adult Health

## 2024-05-23 ENCOUNTER — Ambulatory Visit: Admitting: Primary Care

## 2024-05-23 MED ORDER — PREDNISONE 1 MG PO TABS: ORAL_TABLET | ORAL | 0 refills | Status: AC

## 2024-05-23 NOTE — Telephone Encounter (Signed)
 Taper has been sent

## 2024-06-14 ENCOUNTER — Ambulatory Visit: Admitting: Primary Care

## 2024-06-14 ENCOUNTER — Other Ambulatory Visit: Payer: Self-pay | Admitting: Primary Care

## 2024-07-03 ENCOUNTER — Ambulatory Visit
Admission: EM | Admit: 2024-07-03 | Discharge: 2024-07-03 | Disposition: A | Discharge status: Home / Self Care | Attending: Family Medicine | Admitting: Family Medicine

## 2024-07-03 DIAGNOSIS — J069 Acute upper respiratory infection, unspecified: Secondary | ICD-10-CM | POA: Diagnosis not present

## 2024-07-03 DIAGNOSIS — M130 Polyarthritis, unspecified: Secondary | ICD-10-CM | POA: Diagnosis not present

## 2024-07-03 MED ORDER — PREDNISONE 1 MG PO TABS: 4.0000 mg | ORAL_TABLET | Freq: Every day | ORAL | 0 refills | Status: DC

## 2024-07-03 MED ORDER — AMOXICILLIN-POT CLAVULANATE 875-125 MG PO TABS: 1.0000 | ORAL_TABLET | Freq: Two times a day (BID) | ORAL | 0 refills | Status: AC

## 2024-07-03 MED ORDER — IPRATROPIUM BROMIDE 0.06 % NA SOLN
2.0000 | Freq: Four times a day (QID) | NASAL | 12 refills | Status: AC
Start: 2024-07-03 — End: ?

## 2024-07-03 MED ORDER — BENZONATATE 100 MG PO CAPS: 200.0000 mg | ORAL_CAPSULE | Freq: Three times a day (TID) | ORAL | 0 refills | Status: DC

## 2024-07-03 MED ORDER — PREDNISONE 1 MG PO TABS: 4.0000 mg | ORAL_TABLET | Freq: Every day | ORAL | 0 refills | Status: AC

## 2024-07-03 MED ORDER — PROMETHAZINE-DM 6.25-15 MG/5ML PO SYRP
5.0000 mL | ORAL_SOLUTION | Freq: Four times a day (QID) | ORAL | 0 refills | Status: AC | PRN
Start: 2024-07-03 — End: ?

## 2024-07-03 NOTE — ED Triage Notes (Signed)
 Patient states that he's been on prednisone  for 2 years for breathing. Tapering off. Patient states that since tapering off he's having swelling in his legs and hands. Pain in all joints.   Facial pain Bilateral ear pain Stuffy nose x 3 days

## 2024-07-03 NOTE — Discharge Instructions (Addendum)
 Take the Augmentin  875 mg twice daily with food for 7 days for treatment of your upper respiratory tract infection.  Use the Atrovent  nasal spray, 2 squirts in each nostril every 6 hours, as needed for runny nose and postnasal drip.  Use the Tessalon  Perles every 8 hours during the day.  Take them with a small sip of water.  They may give you some numbness to the base of your tongue or a metallic taste in your mouth, this is normal.  Use the Promethazine  DM cough syrup at bedtime for cough and congestion.  It will make you drowsy so do not take it during the day.  With regards to your joint pain as a result of your tapering off of steroids I would recommend you going back up to 4 mg a day seeing is how you did not experience joint pain at that level and then talk with your doctor about doing a slower taper to try and get off the prednisone .  You may never be able to come off of the prednisone .  Return for reevaluation or see your primary care provider for any new or worsening symptoms.

## 2024-07-03 NOTE — ED Provider Notes (Signed)
 [facility]    CSN: [id number] Arrival date & time: 07/03/24  1757      History   Chief Complaint Chief Complaint  Patient presents with   Joint Pain   Joint Swelling    HPI Eugene Blackburn is a 53 y.o. male.   HPI  53 year old male with past medical history significant for asthma, hyperlipidemia, chronic idiopathic constipation, ED, and degenerative disorder of muscle presents for evaluation of respiratory and musculoskeletal complaints.  The respiratory complaints been going on for last 3 days and consist of facial pain, bilateral ear pain, nasal congestion, and infrequent, nonproductive cough.  He denies any fever.  His muscle pain consists of polyarthropathy and swelling of his hands and feet.  He has been on high-dose prednisone  for years and over the last several months he has been trying to taper off the prednisone  at his doctor's direction.  He reports that when he got down to 3 mg/day he started to experience significant pain in all of his joints.  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 Authorizing Provider  albuterol  (VENTOLIN  HFA) 108 (90 Base) MCG/ACT inhaler Inhale 2 puffs into the lungs every 4 (four) hours  as needed for wheezing or shortness of breath. 07/20/23  Yes Hope Almarie ORN, NP  aluminum -magnesium  hydroxide-simethicone  (MAALOX) 200-200-20 MG/5ML SUSP Take 30 mLs by mouth 4 (four) times daily -  before meals and at bedtime. 06/24/23  Yes White, Shelba SAUNDERS, NP  amoxicillin -clavulanate (AUGMENTIN ) 875-125 MG tablet Take 1 tablet by mouth every 12 (twelve) hours for 7 days. 07/03/24 07/10/24 Yes Bernardino Ditch, NP  benzonatate  (TESSALON ) 100 MG capsule Take 2 capsules (200 mg total) by mouth every 8 (eight) hours. 07/03/24  Yes Bernardino Ditch, NP  Budeson-Glycopyrrol-Formoterol (BREZTRI  AEROSPHERE) 160-9-4.8 MCG/ACT AERO Inhale 2 puffs into the lungs in the morning and at bedtime. 07/20/23  Yes Hope Almarie ORN, NP  Budeson-Glycopyrrol-Formoterol (BREZTRI  AEROSPHERE) 160-9-4.8 MCG/ACT AERO Inhale 2 puffs into the lungs in the morning and at bedtime. 07/20/23  Yes Hope Almarie ORN, NP  clonazePAM (KLONOPIN) 0.5 MG tablet Take 0.5 mg by mouth 2 (two) times daily as needed. 01/14/22  Yes [provider]  famotidine  (PEPCID ) 20 MG tablet Take 1 tablet (20 mg total) by mouth 2 (two) times daily. 06/24/23  Yes White, Adrienne R, NP  furosemide  (LASIX ) 40 MG tablet Take 40 mg by mouth 2 (two) times daily. 05/02/23  Yes [provider]  KLOR-CON M20 20 MEQ tablet Take 20 mEq by mouth daily. 04/17/23  Yes [provider]  LINZESS 290 MCG CAPS  capsule Take 290 mcg by mouth daily. 04/23/23  Yes [provider]  metoprolol succinate (TOPROL-XL) 50 MG 24 hr tablet Take 50 mg by mouth daily. 02/23/24  Yes [provider]  mometasone  (NASONEX ) 50 MCG/ACT nasal spray Place 2 sprays into the nose daily. 10/12/23  Yes Van Knee, MD  ondansetron  (ZOFRAN -ODT) 4 MG disintegrating tablet Take 1 tablet (4 mg total) by mouth every 8 (eight) hours as needed for nausea or vomiting. 06/24/23  Yes Teresa Shelba SAUNDERS, NP  oxyCODONE (OXYCONTIN) 20 mg 12 hr tablet Take 20 mg by mouth every 12  (twelve) hours.   Yes [provider]  PANCREAZE 16800 units CPEP Take 1 capsule by mouth 3 (three) times daily. 02/01/18  Yes [provider]  pantoprazole  (PROTONIX ) 40 MG tablet Take 40 mg by mouth daily.   Yes [provider]  predniSONE  (DELTASONE ) 1 MG tablet Take 1 mg by mouth every other day.   Yes [provider]  predniSONE  (DELTASONE ) 1 MG tablet Take 4 tablets (4 mg total) by mouth daily with breakfast. 07/03/24 08/02/24 Yes Bernardino Ditch, NP  promethazine -dextromethorphan (PROMETHAZINE -DM) 6.25-15 MG/5ML syrup Take 5 mLs by mouth 4 (four) times daily as needed. 07/03/24  Yes Bernardino Ditch, NP  spironolactone (ALDACTONE) 25 MG tablet Take 25 mg by mouth 2 (two) times daily as needed. 04/25/24  Yes [provider]  ipratropium (ATROVENT ) 0.06 % nasal spray Place 2 sprays into both nostrils 4 (four) times daily. 07/03/24   Bernardino Ditch, NP  nystatin  (MYCOSTATIN ) 100000 UNIT/ML suspension Take 5 mLs (500,000 Units total) by mouth 4 (four) times daily. Swish and swallow x 7-14 days. Retain in mouth as long as possible 12/12/23   Kriste Berth, DO    Family History Family History  Problem Relation Age of Onset   Lung cancer Mother    Pancreatic disease Father     Social History Social History   Tobacco Use   Smoking status: Former    Types: Cigarettes   Smokeless tobacco: Never   Tobacco comments:    Patient does not know what year he quit smoking.  Vaping Use   Vaping status: Former  Substance Use Topics   Alcohol use: Yes   Drug use: No     Allergies   Gabapentin, Zithromax [azithromycin], and Levaquin  [levofloxacin ]   Review of Systems Review of Systems  Constitutional:  Negative for fever.  HENT:  Positive for congestion, ear pain, rhinorrhea and sinus pressure. Negative for sore throat.   Respiratory:  Positive for cough. Negative for shortness of breath and wheezing.   Musculoskeletal:  Positive for arthralgias.      Physical Exam Triage Vital Signs ED Triage Vitals  Encounter Vitals Group     BP      Girls Systolic BP Percentile      Girls Diastolic BP Percentile      Boys Systolic BP Percentile      Boys Diastolic BP Percentile      Pulse      Resp      Temp      Temp src      SpO2      Weight      Height      Head Circumference      Peak Flow      Pain Score      Pain Loc      Pain Education      Exclude from Growth Chart    No data  found.  Updated Vital Signs BP 114/68 (BP Location: Left Arm)   Pulse 97   Temp 99 F (37.2 C) (Oral)   Resp 19   SpO2 94%   Visual Acuity Right Eye Distance:   Left Eye Distance:   Bilateral Distance:    Right Eye Near:   Left Eye Near:    Bilateral Near:     Physical Exam Vitals and nursing note reviewed.  Constitutional:      Appearance: Normal appearance. He is not ill-appearing.  HENT:     Head: Normocephalic and atraumatic.     Right Ear: Tympanic membrane, ear canal and external ear normal. There is no impacted cerumen.     Left Ear: Tympanic membrane, ear canal and external ear normal. There is no impacted cerumen.     Nose: Congestion and rhinorrhea present.     Comments: This mucosa is erythematous and edematous with yellow discharge in both nares.    Mouth/Throat:     Mouth: Mucous membranes are moist.     Pharynx: Oropharynx is clear. No oropharyngeal exudate or posterior oropharyngeal erythema.  Cardiovascular:     Rate and Rhythm: Normal rate and regular rhythm.     Pulses: Normal pulses.     Heart sounds: Normal heart sounds. No murmur heard.    No friction rub. No gallop.  Pulmonary:     Effort: Pulmonary effort is normal.     Breath sounds: Normal breath sounds. No wheezing, rhonchi or rales.  Musculoskeletal:     Cervical back: Normal range of motion and neck supple. No tenderness.     Right lower leg: Edema present.     Left lower leg: Edema present.     Comments: 2+, nonpitting edema in bilateral lower  extremities and ankles.  Lymphadenopathy:     Cervical: No cervical adenopathy.  Skin:    General: Skin is warm and dry.     Capillary Refill: Capillary refill takes less than 2 seconds.     Findings: No erythema.  Neurological:     General: No focal deficit present.     Mental Status: He is alert and oriented to person, place, and time.      UC Treatments / Results  Labs (all labs ordered are listed, but only abnormal results are displayed) Labs Reviewed - No data to display  EKG   Radiology No results found.  Procedures Procedures (including critical care time)  Medications Ordered in UC Medications - No data to display  Initial Impression / Assessment and Plan / UC Course  I have reviewed the triage vital signs and the nursing notes.  Pertinent labs & imaging results that were available during my care of the patient were reviewed by me and considered in my medical decision making (see chart for details).   Patient is a pleasant, nontoxic-appearing 54 year old male presenting for evaluation of respiratory musculoskeletal complaints outlined HPI above.  With regards to his respiratory complaint, the patient exam reveals inflammation of his nasal mucosa with thick yellow discharge in both nares.  Oropharyngeal exam is benign.  No cervical lymphadenopathy present.  Cardiopulmonary exam reveals: Sounds in all fields.  Bilateral tympanic membranes are also pearly gray in appearance with normal light reflex and clear external auditory canals.  The patient has an upper respiratory infection and I will treat him as such with a 7-day course of Augmentin .  I will also treat him with Atrovent  nasal spray to open nasal congestion.  Tessalon  Perles and Promethazine   DM cough syrup for cough and congestion.  With regards to the patient's polyarthropathy.  He has been tapering down the off long-term term prednisone  use over the past several months.  He initially was at 20 mg a day, tapered down  to 15 mg a day for 2 weeks, then 10 mg a day for 2 weeks, then 5 mg a day for 2 weeks, and he has now got himself down to 3 mg a day.  When he hit the 3 mg/day threshold he started to experience the joint pain.  I advised the patient that he may never be able to come off of the steroids.  I would advise him going back up to 4 mg a day and then talking with his PCP about doing a longer weaning taper.  I will send more 1 mg prednisone  tablets to the pharmacy for him to cover him for the next several weeks until he can get into see his PCP.   Final Clinical Impressions(s) / UC Diagnoses   Final diagnoses:  URI with cough and congestion  Polyarthropathy     Discharge Instructions      Take the Augmentin  875 mg twice daily with food for 7 days for treatment of your upper respiratory tract infection.  Use the Atrovent  nasal spray, 2 squirts in each nostril every 6 hours, as needed for runny nose and postnasal drip.  Use the Tessalon  Perles every 8 hours during the day.  Take them with a small sip of water.  They may give you some numbness to the base of your tongue or a metallic taste in your mouth, this is normal.  Use the Promethazine  DM cough syrup at bedtime for cough and congestion.  It will make you drowsy so do not take it during the day.  With regards to your joint pain as a result of your tapering off of steroids I would recommend you going back up to 4 mg a day seeing is how you did not experience joint pain at that level and then talk with your doctor about doing a slower taper to try and get off the prednisone .  You may never be able to come off of the prednisone .  Return for reevaluation or see your primary care provider for any new or worsening symptoms.      ED Prescriptions     Medication Sig Dispense Auth. Provider   ipratropium (ATROVENT ) 0.06 % nasal spray Place 2 sprays into both nostrils 4 (four) times daily. 15 mL Bernardino Ditch, NP   amoxicillin -clavulanate (AUGMENTIN )  875-125 MG tablet Take 1 tablet by mouth every 12 (twelve) hours for 7 days. 14 tablet Bernardino Ditch, NP   benzonatate  (TESSALON ) 100 MG capsule Take 2 capsules (200 mg total) by mouth every 8 (eight) hours. 21 capsule Bernardino Ditch, NP   promethazine -dextromethorphan (PROMETHAZINE -DM) 6.25-15 MG/5ML syrup Take 5 mLs by mouth 4 (four) times daily as needed. 118 mL Bernardino Ditch, NP   predniSONE  (DELTASONE ) 1 MG tablet Take 4 tablets (4 mg total) by mouth daily with breakfast. 120 tablet Bernardino Ditch, NP      PDMP not reviewed this encounter.   Bernardino Ditch, NP 07/03/24 [phone]

## 2024-07-10 ENCOUNTER — Encounter: Payer: Self-pay | Admitting: Ophthalmology

## 2024-07-11 ENCOUNTER — Encounter: Payer: Self-pay | Admitting: Ophthalmology

## 2024-07-17 NOTE — Discharge Instructions (Signed)

## 2024-07-19 HISTORY — DX: Other specified chronic obstructive pulmonary disease: J44.89

## 2024-08-16 ENCOUNTER — Ambulatory Visit: Payer: Self-pay | Admitting: Anesthesiology

## 2024-08-16 ENCOUNTER — Other Ambulatory Visit: Payer: Self-pay

## 2024-08-16 ENCOUNTER — Encounter
Admission: RE | Disposition: A | Discharge status: Home / Self Care | Payer: Self-pay | Source: Home / Self Care | Attending: Ophthalmology

## 2024-08-16 ENCOUNTER — Ambulatory Visit
Admission: RE | Admit: 2024-08-16 | Discharge: 2024-08-16 | Disposition: A | Discharge status: Home / Self Care | Attending: Ophthalmology | Admitting: Ophthalmology

## 2024-08-16 ENCOUNTER — Encounter: Payer: Self-pay | Admitting: Ophthalmology

## 2024-08-16 DIAGNOSIS — Z87891 Personal history of nicotine dependence: Secondary | ICD-10-CM | POA: Diagnosis not present

## 2024-08-16 DIAGNOSIS — J449 Chronic obstructive pulmonary disease, unspecified: Secondary | ICD-10-CM | POA: Diagnosis not present

## 2024-08-16 DIAGNOSIS — H25042 Posterior subcapsular polar age-related cataract, left eye: Secondary | ICD-10-CM | POA: Diagnosis present

## 2024-08-16 DIAGNOSIS — Z5941 Food insecurity: Secondary | ICD-10-CM | POA: Insufficient documentation

## 2024-08-16 DIAGNOSIS — H2512 Age-related nuclear cataract, left eye: Secondary | ICD-10-CM | POA: Diagnosis present

## 2024-08-16 DIAGNOSIS — Z5986 Financial insecurity: Secondary | ICD-10-CM | POA: Diagnosis not present

## 2024-08-16 HISTORY — DX: Prediabetes: R73.03

## 2024-08-16 HISTORY — DX: Post-traumatic stress disorder, unspecified: F43.10

## 2024-08-16 HISTORY — DX: Unspecified osteoarthritis, unspecified site: M19.90

## 2024-08-16 HISTORY — DX: Emphysema, unspecified: J43.9

## 2024-08-16 HISTORY — PX: CATARACT EXTRACTION W/PHACO: SHX586

## 2024-08-16 HISTORY — DX: Personal history of unintended awareness under general anesthesia: Z92.84

## 2024-08-16 SURGERY — PHACOEMULSIFICATION, CATARACT, WITH IOL INSERTION
Anesthesia: Monitor Anesthesia Care | Site: Eye | Laterality: Left

## 2024-08-16 MED ORDER — SIGHTPATH DOSE#1 BSS IO SOLN
INTRAOCULAR | Status: DC | PRN
  Administered 2024-08-16: 15 mL via INTRAOCULAR

## 2024-08-16 MED ORDER — TETRACAINE HCL 0.5 % OP SOLN
OPHTHALMIC | Status: AC
  Filled 2024-08-16: qty 4

## 2024-08-16 MED ORDER — FENTANYL CITRATE (PF) 100 MCG/2ML IJ SOLN
INTRAMUSCULAR | Status: AC
  Filled 2024-08-16: qty 2

## 2024-08-16 MED ORDER — MIDAZOLAM HCL 2 MG/2ML IJ SOLN
INTRAMUSCULAR | Status: DC | PRN
  Administered 2024-08-16: 2 mg via INTRAVENOUS

## 2024-08-16 MED ORDER — ARMC OPHTHALMIC DILATING DROPS
1.0000 | OPHTHALMIC | Status: DC | PRN
  Administered 2024-08-16 (×3): 1 via OPHTHALMIC

## 2024-08-16 MED ORDER — FENTANYL CITRATE (PF) 100 MCG/2ML IJ SOLN
INTRAMUSCULAR | Status: DC | PRN
  Administered 2024-08-16 (×2): 50 ug via INTRAVENOUS

## 2024-08-16 MED ORDER — CEFUROXIME OPHTHALMIC INJECTION 1 MG/0.1 ML
INJECTION | OPHTHALMIC | Status: DC | PRN
Start: 2024-08-16 — End: 2024-08-16
  Administered 2024-08-16: 1 mg via INTRACAMERAL

## 2024-08-16 MED ORDER — ARMC OPHTHALMIC DILATING DROPS
OPHTHALMIC | Status: AC
  Filled 2024-08-16: qty 0.5

## 2024-08-16 MED ORDER — SIGHTPATH DOSE#1 NA HYALUR & NA CHOND-NA HYALUR IO KIT
PACK | INTRAOCULAR | Status: DC | PRN
Start: 2024-08-16 — End: 2024-08-16
  Administered 2024-08-16: 1 via OPHTHALMIC

## 2024-08-16 MED ORDER — MIDAZOLAM HCL 2 MG/2ML IJ SOLN
INTRAMUSCULAR | Status: AC
Start: 2024-08-16 — End: 2024-08-16
  Filled 2024-08-16: qty 2

## 2024-08-16 MED ORDER — SIGHTPATH DOSE#1 BSS IO SOLN
INTRAOCULAR | Status: DC | PRN
  Administered 2024-08-16: 49 mL via OPHTHALMIC

## 2024-08-16 MED ORDER — TETRACAINE HCL 0.5 % OP SOLN
1.0000 [drp] | OPHTHALMIC | Status: DC | PRN
  Administered 2024-08-16 (×3): 1 [drp] via OPHTHALMIC

## 2024-08-16 MED ORDER — MIDAZOLAM HCL 2 MG/2ML IJ SOLN
2.0000 mg | Freq: Once | INTRAMUSCULAR | Status: AC
  Administered 2024-08-16: 2 mg via INTRAVENOUS

## 2024-08-16 MED ORDER — MIDAZOLAM HCL 2 MG/2ML IJ SOLN
INTRAMUSCULAR | Status: AC
  Filled 2024-08-16: qty 2

## 2024-08-16 MED ORDER — LIDOCAINE HCL (PF) 2 % IJ SOLN
INTRAOCULAR | Status: DC | PRN
  Administered 2024-08-16: 2 mL

## 2024-08-16 MED ORDER — BRIMONIDINE TARTRATE-TIMOLOL 0.2-0.5 % OP SOLN
OPHTHALMIC | Status: DC | PRN
Start: 2024-08-16 — End: 2024-08-16
  Administered 2024-08-16: 1 [drp] via OPHTHALMIC

## 2024-08-16 MED ORDER — LACTATED RINGERS IV SOLN: INTRAVENOUS | Status: DC

## 2024-08-16 SURGICAL SUPPLY — 8 items
FEE CATARACT SUITE SIGHTPATH (MISCELLANEOUS) ×1 IMPLANT
GLOVE BIOGEL PI IND STRL 8 (GLOVE) ×1 IMPLANT
GLOVE SURG LX STRL 7.5 STRW (GLOVE) ×1 IMPLANT
GLOVE SURG SYN 6.5 PF PI BL (GLOVE) ×1 IMPLANT
LENS IOL TECNIS EYHANCE 22.0 (Intraocular Lens) IMPLANT
NDL FILTER BLUNT 18X1 1/2 (NEEDLE) ×1 IMPLANT
NEEDLE FILTER BLUNT 18X1 1/2 (NEEDLE) ×1 IMPLANT
SYR 3ML LL SCALE MARK (SYRINGE) ×1 IMPLANT

## 2024-08-16 NOTE — Transfer of Care (Signed)
 Immediate Anesthesia Transfer of Care Note  Patient: Eugene Blackburn  Procedure(s) Performed: PHACOEMULSIFICATION, CATARACT, WITH IOL INSERTION 4.54 00:30.2 (Left: Eye)  Patient Location: PACU  Anesthesia Type: MAC  Level of Consciousness: awake, alert  and patient cooperative  Airway and Oxygen Therapy: Patient Spontanous Breathing and Patient connected to supplemental oxygen  Post-op Assessment: Post-op Vital signs reviewed, Patient's Cardiovascular Status Stable, Respiratory Function Stable, Patent Airway and No signs of Nausea or vomiting  Post-op Vital Signs: Reviewed and stable  Complications: No notable events documented.

## 2024-08-16 NOTE — Op Note (Signed)
 OPERATIVE NOTE  Eugene Blackburn [id number] 08/16/2024   PREOPERATIVE DIAGNOSIS:  Nuclear sclerotic cataract left eye. H25.12   POSTOPERATIVE DIAGNOSIS:    Nuclear sclerotic cataract left eye.     PROCEDURE:  Phacoemusification with posterior chamber intraocular lens placement of the left eye  Ultrasound time: Procedure(s): PHACOEMULSIFICATION, CATARACT, WITH IOL INSERTION 4.54 00:30.2 (Left)  LENS:   Implant Name Type Inv. Item Serial No. Manufacturer Lot No. LRB No. Used Action  LENS IOL TECNIS EYHANCE 22.0 - [id number] Intraocular Lens LENS IOL TECNIS EYHANCE 22.0 [id number] [employer]  Left 1 Implanted      SURGEON:  Dene FABIENE Etienne, MD   ANESTHESIA:  Topical with tetracaine  drops and 2% Xylocaine  jelly, augmented with 1% preservative-free intracameral lidocaine .    COMPLICATIONS:  None.   DESCRIPTION OF PROCEDURE:  The patient was identified in the holding room and transported to the operating room and placed in the supine position under the operating microscope.  The left eye was identified as the operative eye and it was prepped and draped in the usual sterile ophthalmic fashion.   A 1 millimeter clear-corneal paracentesis was made at the 1:30 position.  0.5 ml of preservative-free 1% lidocaine  was injected into the anterior chamber.  The anterior chamber was filled with Viscoat viscoelastic.  A 2.4 millimeter keratome was used to make a near-clear corneal incision at the 10:30 position.  .  A curvilinear capsulorrhexis was made with a cystotome and capsulorrhexis forceps.  Balanced salt solution was used to hydrodissect and hydrodelineate the nucleus.   Phacoemulsification was then used in stop and chop fashion to remove the lens nucleus and epinucleus.  The remaining cortex was then removed using the irrigation and aspiration handpiece. Provisc was then placed into the capsular bag to distend it for lens placement.  A lens was then injected into the capsular bag.  The  remaining viscoelastic was aspirated.   Wounds were hydrated with balanced salt solution.  The anterior chamber was inflated to a physiologic pressure with balanced salt solution.  No wound leaks were noted. Cefuroxime  0.1 ml of a 10mg /ml solution was injected into the anterior chamber for a dose of 1 mg of intracameral antibiotic at the completion of the case.   Timolol  and Brimonidine  drops were applied to the eye.  The patient was taken to the recovery room in stable condition without complications of anesthesia or surgery.  Aristide Waggle 08/16/2024, 9:58 AM

## 2024-08-16 NOTE — H&P (Signed)
 [facility]   Primary Care Physician:  Donal Channing SQUIBB, FNP Ophthalmologist: Dr. Dene Etienne  Pre-Procedure History & Physical: HPI:  Eugene Blackburn is a 53 y.o. male here for ophthalmic surgery.   Past Medical History:  Diagnosis Date   Arthritis    Asthma    Chronic idiopathic constipation    Chronic obstruct airways disease (HCC)    Chronic pancreatitis (HCC)    COPD with chronic bronchitis and emphysema (HCC)    Degenerative disorder of muscle    Hearing loss    Hemorrhoids    History of unintended awareness under general anesthesia    Hyperlipidemia    Insomnia    Male erectile dysfunction    Nausea    Pancreas (digestive gland) works poorly    Pre-diabetes    PTSD (post-traumatic stress disorder)    Seasonal allergic rhinitis    Vitamin D deficiency     Past Surgical History:  Procedure Laterality Date   PANCREAS SURGERY      Prior to Admission medications   Medication Sig Start Date End Date Taking? Authorizing Provider  albuterol  (VENTOLIN  HFA) 108 (90 Base) MCG/ACT inhaler Inhale 2 puffs into the lungs every 4 (four) hours as needed for wheezing or shortness of breath. 07/20/23  Yes Hope Almarie ORN, NP  aluminum -magnesium  hydroxide-simethicone  (MAALOX) 200-200-20 MG/5ML SUSP Take 30 mLs by mouth 4 (four) times daily -  before meals and at bedtime. 06/24/23  Yes White, Adrienne R, NP  clonazePAM (KLONOPIN) 0.5 MG tablet Take 0.5 mg by mouth 2 (two) times daily as needed. 01/14/22  Yes [provider]  oxyCODONE (OXYCONTIN) 20 mg 12 hr tablet Take 20 mg by mouth every 12 (twelve) hours.   Yes [provider]  benzonatate  (TESSALON ) 100 MG capsule Take 2 capsules (200 mg total) by mouth every 8 (eight) hours. Patient not taking: Reported on 07/10/2024 07/03/24   Bernardino Ditch, NP  Budeson-Glycopyrrol-Formoterol (BREZTRI  AEROSPHERE) 160-9-4.8 MCG/ACT AERO Inhale 2 puffs into the lungs in the morning and at bedtime. 07/20/23   Hope Almarie ORN, NP  Budeson-Glycopyrrol-Formoterol (BREZTRI  AEROSPHERE) 160-9-4.8 MCG/ACT AERO Inhale 2 puffs into the lungs in the morning and at bedtime. Patient not taking: Reported on 07/10/2024 07/20/23   Hope Almarie ORN, NP  famotidine  (PEPCID ) 20 MG tablet Take 1 tablet (20 mg total) by mouth 2 (two) times daily. Patient not taking: Reported on 07/10/2024 06/24/23   Teresa Shelba SAUNDERS, NP  furosemide  (LASIX ) 40 MG tablet Take 40 mg by mouth 2 (two) times daily. Patient not taking: Reported on 07/10/2024 05/02/23   [provider]  ipratropium (ATROVENT ) 0.06 % nasal spray Place 2 sprays into both nostrils 4 (four) times daily. 07/03/24   Bernardino Ditch, NP  KLOR-CON M20 20 MEQ tablet Take 20 mEq by mouth daily. Patient not taking: Reported on 07/10/2024 04/17/23   [provider]  LINZESS 290 MCG CAPS capsule Take 290 mcg by mouth daily. Patient not taking: Reported on 07/10/2024 04/23/23   [provider]  metoprolol succinate (TOPROL-XL) 50 MG 24 hr tablet Take 50 mg by mouth daily. Patient not taking: Reported on 07/10/2024 02/23/24   [provider]  mometasone  (NASONEX ) 50 MCG/ACT nasal spray Place 2 sprays into the nose daily. Patient not taking: Reported on 07/10/2024 10/12/23   Mortenson, Ashley, MD  nystatin  (MYCOSTATIN ) 100000 UNIT/ML suspension Take 5 mLs (500,000 Units total) by mouth 4 (four) times daily. Swish and swallow x 7-14 days. Retain in mouth as  long as possible Patient not taking: Reported on 07/10/2024 12/12/23   Brimage, Vondra, DO  ondansetron  (ZOFRAN -ODT) 4 MG disintegrating tablet Take 1 tablet (4 mg total) by mouth every 8 (eight) hours as needed for nausea or vomiting. Patient not taking: Reported on 07/10/2024 06/24/23   Teresa Shelba SAUNDERS, NP  PANCREAZE 16800 units CPEP Take 1 capsule by mouth 3 (three) times daily. Patient not taking: Reported on 07/10/2024 02/01/18   [provider]  pantoprazole  (PROTONIX ) 40 MG tablet Take 40 mg by  mouth daily. Patient not taking: Reported on 07/10/2024    [provider]  predniSONE  (DELTASONE ) 1 MG tablet Take 1 mg by mouth every other day. Patient not taking: Reported on 07/10/2024    [provider]  promethazine -dextromethorphan (PROMETHAZINE -DM) 6.25-15 MG/5ML syrup Take 5 mLs by mouth 4 (four) times daily as needed. Patient not taking: Reported on 07/10/2024 07/03/24   Bernardino Ditch, NP  spironolactone (ALDACTONE) 25 MG tablet Take 25 mg by mouth 2 (two) times daily as needed. Patient not taking: Reported on 07/10/2024 04/25/24   [provider]    Allergies as of 06/16/2024 - Review Complete 04/16/2024  Allergen Reaction Noted   Gabapentin Shortness Of Breath 03/09/2020   Zithromax [azithromycin] Rash 08/31/2015   Levaquin  [levofloxacin ] Other (See Comments) 01/19/2020    Family History  Problem Relation Age of Onset   Lung cancer Mother    Pancreatic disease Father     Social History   Socioeconomic History   Marital status: Single    Spouse name: Not on file   Number of children: Not on file   Years of education: Not on file   Highest education level: Not on file  Occupational History   Not on file  Tobacco Use   Smoking status: Former    Types: Cigarettes   Smokeless tobacco: Never   Tobacco comments:    Patient does not know what year he quit smoking.  Vaping Use   Vaping status: Former  Substance and Sexual Activity   Alcohol use: Not Currently   Drug use: No   Sexual activity: Not Currently  Other Topics Concern   Not on file  Social History Narrative   Not on file   Social Drivers of Health   Financial Resource Strain: Medium Risk (04/10/2024)   Received from [facility]   Overall Financial Resource Strain (CARDIA)    Difficulty of Paying Living Expenses: Somewhat hard  Food Insecurity: Food Insecurity Present (04/10/2024)   Received from [facility]   Hunger Vital Sign    Within the  past 12 months, you worried that your food would run out before you got the money to buy more.: Often true    Ran Out of Food in the Last Year: Not on file  Transportation Needs: No Transportation Needs (04/10/2024)   Received from [facility] - Transportation    In the past 12 months, has lack of transportation kept you from medical appointments or from getting medications?: No    Lack of Transportation (Non-Medical): No  Physical Activity: Not on file  Stress: Not on file  Social Connections: Not on file  Intimate Partner Violence: Not on file    Review of Systems: See HPI, otherwise negative ROS  Physical Exam: BP 131/80   Temp 98.4 F (36.9 C) (Temporal)   Resp 14   Ht 6' 0.01 (1.829 m)   Wt 107.8 kg   SpO2  99%   BMI 32.23 kg/m  General:   Alert,  pleasant and cooperative in NAD Head:  Normocephalic and atraumatic. Lungs:  Clear to auscultation.    Heart:  Regular rate and rhythm.   Impression/Plan: Garnette LITTIE Hopkins is here for ophthalmic surgery.  Risks, benefits, limitations, and alternatives regarding ophthalmic surgery have been reviewed with the patient.  Questions have been answered.  All parties agreeable.   MITTIE GASKIN, MD  08/16/2024, 9:19 AM

## 2024-08-16 NOTE — Anesthesia Postprocedure Evaluation (Signed)
 Anesthesia Post Note  Patient: Eugene Blackburn  Procedure(s) Performed: PHACOEMULSIFICATION, CATARACT, WITH IOL INSERTION 4.54 00:30.2 (Left: Eye)  Patient location during evaluation: PACU Anesthesia Type: MAC Level of consciousness: awake and alert Pain management: pain level controlled Vital Signs Assessment: post-procedure vital signs reviewed and stable Respiratory status: spontaneous breathing, nonlabored ventilation, respiratory function stable and patient connected to nasal cannula oxygen Cardiovascular status: stable and blood pressure returned to baseline Postop Assessment: no apparent nausea or vomiting Anesthetic complications: no   No notable events documented.   Last Vitals:  Vitals:   08/16/24 0958 08/16/24 1003  BP: 103/83 112/81  Pulse: 86 88  Resp: (!) 21 17  Temp: (!) 36.4 C (!) 36.4 C  SpO2: 100% 98%    Last Pain:  Vitals:   08/16/24 1003  TempSrc:   PainSc: 0-No pain                 Kyzer Blowe C Rolly Magri

## 2024-08-16 NOTE — Anesthesia Preprocedure Evaluation (Addendum)
 Anesthesia Evaluation  Patient identified by MRN, date of birth, ID band Patient awake    Reviewed: Allergy & Precautions, H&P , NPO status , Patient's Chart, lab work & pertinent test results  Airway Mallampati: III  TM Distance: >3 FB Neck ROM: Full    Dental no notable dental hx.    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  Administered versed  2 mg IV preop for anxiolysis   Reproductive/Obstetrics negative OB ROS                              Anesthesia Physical Anesthesia Plan  ASA: 3  Anesthesia Plan: MAC   Post-op Pain Management:    Induction: Intravenous  PONV Risk Score and Plan:   Airway Management Planned: Natural Airway and Nasal Cannula  Additional  Equipment:   Intra-op Plan:   Post-operative Plan:   Informed Consent: I have reviewed the patients History and Physical, chart, labs and discussed the procedure including the risks, benefits and alternatives for the proposed anesthesia with the patient or authorized representative who has indicated his/her understanding and acceptance.     Dental Advisory Given  Plan Discussed with: Anesthesiologist, CRNA and Surgeon  Anesthesia Plan Comments: (Patient consented for risks of anesthesia including but not limited to:  - adverse reactions to medications - damage to eyes, teeth, lips or other oral mucosa - nerve damage due to positioning  - sore throat or hoarseness - Damage to heart, brain, nerves, lungs, other parts of body or loss of life  Patient voiced understanding and assent.)         Anesthesia Quick Evaluation

## 2024-08-18 ENCOUNTER — Ambulatory Visit
Admission: EM | Admit: 2024-08-18 | Discharge: 2024-08-18 | Disposition: A | Discharge status: Home / Self Care | Attending: Emergency Medicine | Admitting: Emergency Medicine

## 2024-08-18 ENCOUNTER — Encounter: Payer: Self-pay | Admitting: Emergency Medicine

## 2024-08-18 DIAGNOSIS — E2740 Unspecified adrenocortical insufficiency: Secondary | ICD-10-CM | POA: Diagnosis not present

## 2024-08-18 MED ORDER — PREDNISONE 5 MG PO TABS: 5.0000 mg | ORAL_TABLET | Freq: Every day | ORAL | 1 refills | Status: AC

## 2024-08-18 NOTE — ED Provider Notes (Signed)
 [facility]    CSN: [id number] Arrival date & time: 08/18/24  1637      History   Chief Complaint Chief Complaint  Patient presents with   Medication Refill    HPI Eugene Blackburn is a 53 y.o. male.   HPI  53 year old male with past medical history significant for COPD, chronic pancreatitis, and asthma presents with request for refill of 5 mg prednisone  tablets.  He has been on prednisone  for over 2 years and has weaned down to 5 mg/day.  He saw his PCP who stated that they did not feel comfortable continuing the prednisone  prescription and prescribed him 1 mg tablets for 30 days with an indication of a referral to endocrinology.  Patient has not received the referral to endocrinology and he last saw his PCP 3 weeks ago.  Past Medical History:  Diagnosis Date   Arthritis    Asthma    Chronic idiopathic constipation    Chronic obstruct airways disease (HCC)    Chronic pancreatitis (HCC)    COPD with chronic bronchitis and emphysema (HCC)    Degenerative disorder of muscle    Hearing loss    Hemorrhoids    History of unintended awareness under general anesthesia    Hyperlipidemia    Insomnia    Male erectile dysfunction    Nausea    Pancreas (digestive gland) works poorly    Pre-diabetes    PTSD (post-traumatic stress disorder)    Seasonal allergic rhinitis    Vitamin D deficiency     Patient Active Problem List   Diagnosis Date Noted   COPD with chronic bronchitis and emphysema (HCC) 07/26/2023   Tremor 01/26/2019   Numbness and tingling of both legs 01/26/2019   Abdominal pain 12/29/2003    Past Surgical History:  Procedure Laterality Date   CATARACT EXTRACTION W/PHACO Left 08/16/2024   Procedure: PHACOEMULSIFICATION, CATARACT, WITH IOL INSERTION 4.54 00:30.2;  Surgeon: Mittie Gaskin, MD;  Location: [facility];  Service: Ophthalmology;  Laterality: Left;   EYE SURGERY     PANCREAS SURGERY         Home Medications    Prior  to Admission medications   Medication Sig Start Date End Date Taking? Authorizing Provider  predniSONE  (DELTASONE ) 5 MG tablet Take 1 tablet (5 mg total) by mouth daily with breakfast. 08/18/24  Yes Bernardino Ditch, NP  albuterol  (VENTOLIN  HFA) 108 (90 Base) MCG/ACT inhaler Inhale 2 puffs into the lungs every 4 (four) hours as needed for wheezing or shortness of breath. 07/20/23   Hope Almarie ORN, NP  aluminum -magnesium  hydroxide-simethicone  (MAALOX) 200-200-20 MG/5ML SUSP Take 30 mLs by mouth 4 (four) times daily -  before meals and at bedtime. 06/24/23   Teresa Shelba SAUNDERS, NP  Budeson-Glycopyrrol-Formoterol (BREZTRI  AEROSPHERE) 160-9-4.8 MCG/ACT AERO Inhale 2 puffs into the lungs in the morning and at bedtime. 07/20/23   Hope Almarie ORN, NP  Budeson-Glycopyrrol-Formoterol (BREZTRI  AEROSPHERE) 160-9-4.8 MCG/ACT AERO Inhale 2 puffs into the lungs in the morning and at bedtime. Patient not taking: Reported on 07/10/2024 07/20/23   Hope Almarie ORN, NP  clonazePAM (KLONOPIN) 0.5 MG tablet Take 0.5 mg by mouth 2 (two) times daily as needed. 01/14/22   [provider]  famotidine  (PEPCID ) 20 MG tablet Take 1 tablet (20 mg total) by mouth 2 (two) times daily. Patient not taking: Reported on 07/10/2024 06/24/23   Teresa Shelba SAUNDERS, NP  furosemide  (LASIX ) 40 MG tablet Take 40 mg by mouth 2 (two) times daily. Patient  not taking: Reported on 07/10/2024 05/02/23   [provider]  ipratropium (ATROVENT ) 0.06 % nasal spray Place 2 sprays into both nostrils 4 (four) times daily. 07/03/24   Bernardino Ditch, NP  KLOR-CON M20 20 MEQ tablet Take 20 mEq by mouth daily. Patient not taking: Reported on 07/10/2024 04/17/23   [provider]  LINZESS 290 MCG CAPS capsule Take 290 mcg by mouth daily. Patient not taking: Reported on 07/10/2024 04/23/23   [provider]  metoprolol succinate (TOPROL-XL) 50 MG 24 hr tablet Take 50 mg by mouth daily. Patient not taking: Reported on 07/10/2024 02/23/24    [provider]  mometasone  (NASONEX ) 50 MCG/ACT nasal spray Place 2 sprays into the nose daily. Patient not taking: Reported on 07/10/2024 10/12/23   Van Knee, MD  nystatin  (MYCOSTATIN ) 100000 UNIT/ML suspension Take 5 mLs (500,000 Units total) by mouth 4 (four) times daily. Swish and swallow x 7-14 days. Retain in mouth as long as possible Patient not taking: Reported on 07/10/2024 12/12/23   Brimage, Vondra, DO  ondansetron  (ZOFRAN -ODT) 4 MG disintegrating tablet Take 1 tablet (4 mg total) by mouth every 8 (eight) hours as needed for nausea or vomiting. Patient not taking: Reported on 07/10/2024 06/24/23   Teresa Shelba SAUNDERS, NP  oxyCODONE (OXYCONTIN) 20 mg 12 hr tablet Take 20 mg by mouth every 12 (twelve) hours.    [provider]  PANCREAZE 16800 units CPEP Take 1 capsule by mouth 3 (three) times daily. Patient not taking: Reported on 07/10/2024 02/01/18   [provider]  pantoprazole  (PROTONIX ) 40 MG tablet Take 40 mg by mouth daily. Patient not taking: Reported on 07/10/2024    [provider]  predniSONE  (DELTASONE ) 1 MG tablet Take 1 mg by mouth every other day. Patient not taking: Reported on 07/10/2024    [provider]  promethazine -dextromethorphan (PROMETHAZINE -DM) 6.25-15 MG/5ML syrup Take 5 mLs by mouth 4 (four) times daily as needed. Patient not taking: Reported on 07/10/2024 07/03/24   Bernardino Ditch, NP  spironolactone (ALDACTONE) 25 MG tablet Take 25 mg by mouth 2 (two) times daily as needed. Patient not taking: Reported on 07/10/2024 04/25/24   [provider]    Family History Family History  Problem Relation Age of Onset   Lung cancer Mother    Pancreatic disease Father     Social History Social History   Tobacco Use   Smoking status: Former    Types: Cigarettes   Smokeless tobacco: Never   Tobacco comments:    Patient does not know what year he quit smoking.  Vaping Use   Vaping status: Former  Substance  Use Topics   Alcohol use: Not Currently   Drug use: No     Allergies   Gabapentin, Zithromax [azithromycin], and Levaquin  [levofloxacin ]   Review of Systems Review of Systems  Musculoskeletal:  Negative for joint swelling.     Physical Exam Triage Vital Signs ED Triage Vitals  Encounter Vitals Group     BP      Girls Systolic BP Percentile      Girls Diastolic BP Percentile      Boys Systolic BP Percentile      Boys Diastolic BP Percentile      Pulse      Resp      Temp      Temp src      SpO2      Weight      Height      Head Circumference  Peak Flow      Pain Score      Pain Loc      Pain Education      Exclude from Growth Chart    No data found.  Updated Vital Signs BP 123/87 (BP Location: Left Arm)   Pulse (!) 108   Temp 98.2 F (36.8 C) (Oral)   Resp 16   Ht 6' (1.829 m)   Wt 237 lb 10.5 oz (107.8 kg)   SpO2 94%   BMI 32.23 kg/m   Visual Acuity Right Eye Distance:   Left Eye Distance:   Bilateral Distance:    Right Eye Near:   Left Eye Near:    Bilateral Near:     Physical Exam Vitals and nursing note reviewed.  Constitutional:      Appearance: Normal appearance. He is not ill-appearing.  HENT:     Head: Normocephalic and atraumatic.  Skin:    General: Skin is warm and dry.     Capillary Refill: Capillary refill takes less than 2 seconds.     Findings: No erythema or rash.  Neurological:     General: No focal deficit present.     Mental Status: He is alert and oriented to person, place, and time.      UC Treatments / Results  Labs (all labs ordered are listed, but only abnormal results are displayed) Labs Reviewed - No data to display  EKG   Radiology No results found.  Procedures Procedures (including critical care time)  Medications Ordered in UC Medications - No data to display  Initial Impression / Assessment and Plan / UC Course  I have reviewed the triage vital signs and the nursing notes.  Pertinent  labs & imaging results that were available during my care of the patient were reviewed by me and considered in my medical decision making (see chart for details).   Patient is a pleasant, nontoxic-appearing 53 year old male presenting with request for refill of 5 mg prednisone  tablets and request for referral to endocrinology.  He has been on longstanding prednisone  for over 2 years and has weaned down to 5 mg daily.  He reports that he feels best at 5 mg.  When he is try to wean off in the past he has developed joint swelling and muscle aches.  I will refill his 5 mg prednisone  and also refer him to endocrinology for ongoing management and possible weaning off of prednisone .   Final Clinical Impressions(s) / UC Diagnoses   Final diagnoses:  Adrenal insufficiency [facility])     Discharge Instructions      Continue taking your 5 mg once daily of prednisone  to help maintain your adrenal balance.  I have referred you to endocrinology to discuss ongoing management of your steroids versus attempting to wean off.  They will contact you for an appointment.     ED Prescriptions     Medication Sig Dispense Auth. Provider   predniSONE  (DELTASONE ) 5 MG tablet Take 1 tablet (5 mg total) by mouth daily with breakfast. 90 tablet Bernardino Ditch, NP      PDMP not reviewed this encounter.   Bernardino Ditch, NP 08/18/24 [phone]

## 2024-08-18 NOTE — Discharge Instructions (Addendum)
 Continue taking your 5 mg once daily of prednisone  to help maintain your adrenal balance.  I have referred you to endocrinology to discuss ongoing management of your steroids versus attempting to wean off.  They will contact you for an appointment.

## 2024-08-18 NOTE — ED Triage Notes (Signed)
 Patient states that he is prescribed Prednisone  5mg  daily for low Cortisol.  Patient states that he followed up with his PCP who prescribed only 1 mg daily for 30 days.  Patient states that he is out of Prednisone  because he had been taking a total of 5mg  daily.  Patient states that he talked to his PCP who sent in another prescription for Prednisone  but it is dated to be filled on September 27.  Patient states that he needs the correct dose so that he does not go into crisis.  Patient has no symptoms.

## 2024-08-31 ENCOUNTER — Ambulatory Visit: Admitting: Primary Care

## 2024-10-12 ENCOUNTER — Ambulatory Visit: Admission: EM | Admit: 2024-10-12 | Discharge: 2024-10-12 | Disposition: A | Discharge status: Home / Self Care

## 2024-10-12 ENCOUNTER — Encounter: Payer: Self-pay | Admitting: Emergency Medicine

## 2024-10-12 DIAGNOSIS — J449 Chronic obstructive pulmonary disease, unspecified: Secondary | ICD-10-CM | POA: Diagnosis not present

## 2024-10-12 DIAGNOSIS — J019 Acute sinusitis, unspecified: Secondary | ICD-10-CM

## 2024-10-12 MED ORDER — IPRATROPIUM-ALBUTEROL 0.5-2.5 (3) MG/3ML IN SOLN: 3.0000 mL | Freq: Once | RESPIRATORY_TRACT | Status: DC

## 2024-10-12 MED ORDER — AMOXICILLIN-POT CLAVULANATE 875-125 MG PO TABS: 1.0000 | ORAL_TABLET | Freq: Two times a day (BID) | ORAL | 0 refills | Status: AC

## 2024-10-12 NOTE — ED Provider Notes (Signed)
 [facility]    CSN: [id number] Arrival date & time: 10/12/24  1600      History   Chief Complaint Chief Complaint  Patient presents with   Sinus Pressure     HPI Eugene Blackburn is a 53 y.o. male with history of COPD (chronic bronchitis and emphysema) and allergies.  Patient presents today for 5 day history of nasal congestion, sinus pressure, bilateral ear pain/pressure, fatigue and headaches. Mild non productive cough. Patient also reports a lot of postnasal drainage and throat irritation.  Denies fever.  Patient reports he has tried over-the-counter Sudafed and allergy medicine as well as Flonase without any improvement in symptoms.  He reports that he has a history of long-haul COVID.  Patient is on chronic prednisone  5 mg daily, Breztri  and Advair .  Patient has normal oxygen runs around 93%. He is no longer using nebulizers and says COPD has improved from last year. He was on daily 20 mg prednisone  and has been able to reduce that and lose weight. Denies current chest pain, shortness of breath or wheezing. Patient reports sometimes his pulse is elevated because he has a lot of anxiety and stress. He has an appointment with endocrinology in a few weeks to discussed continuing prednisone  and adrenal insufficiency.  HPI  Past Medical History:  Diagnosis Date   Arthritis    Asthma    Chronic idiopathic constipation    Chronic obstruct airways disease (HCC)    Chronic pancreatitis (HCC)    COPD with chronic bronchitis and emphysema (HCC)    Degenerative disorder of muscle    Hearing loss    Hemorrhoids    History of unintended awareness under general anesthesia    Hyperlipidemia    Insomnia    Male erectile dysfunction    Nausea    Pancreas (digestive gland) works poorly    Pre-diabetes    PTSD (post-traumatic stress disorder)    Seasonal allergic rhinitis    Vitamin D deficiency     Patient Active Problem List   Diagnosis Date Noted   COPD with chronic  bronchitis and emphysema (HCC) 07/26/2023   Tremor 01/26/2019   Numbness and tingling of both legs 01/26/2019   Muscle disease 12/28/2005   Abdominal pain 12/29/2003    Past Surgical History:  Procedure Laterality Date   CATARACT EXTRACTION W/PHACO Left 08/16/2024   Procedure: PHACOEMULSIFICATION, CATARACT, WITH IOL INSERTION 4.54 00:30.2;  Surgeon: Mittie Gaskin, MD;  Location: [facility];  Service: Ophthalmology;  Laterality: Left;   EYE SURGERY     PANCREAS SURGERY         Home Medications    Prior to Admission medications   Medication Sig Start Date End Date Taking? Authorizing Provider  amoxicillin -clavulanate (AUGMENTIN ) 875-125 MG tablet Take 1 tablet by mouth every 12 (twelve) hours for 10 days. 10/12/24 10/22/24 Yes Arvis Huxley B, PA-C  ferrous sulfate 325 (65 FE) MG tablet Take 325 mg by mouth. 03/02/24  Yes [provider]  ketorolac  (ACULAR ) 0.5 % ophthalmic solution Place 1 drop into the left eye 4 (four) times daily. 06/19/24  Yes [provider]  ONETOUCH ULTRA test strip 1 each 2 (two) times daily. 09/20/24  Yes [provider]  prednisoLONE acetate (PRED FORTE) 1 % ophthalmic suspension 1 drop 4 (four) times daily. 06/19/24  Yes [provider]  trimethoprim-polymyxin b (POLYTRIM) ophthalmic solution 1 drop 4 (four) times daily. 06/19/24  Yes [provider]  albuterol  (VENTOLIN  HFA) 108 (90 Base) MCG/ACT  inhaler Inhale 2 puffs into the lungs every 4 (four) hours as needed for wheezing or shortness of breath. 07/20/23   Hope Almarie ORN, NP  aluminum -magnesium  hydroxide-simethicone  (MAALOX) 200-200-20 MG/5ML SUSP Take 30 mLs by mouth 4 (four) times daily -  before meals and at bedtime. 06/24/23   Teresa Shelba SAUNDERS, NP  Budeson-Glycopyrrol-Formoterol (BREZTRI  AEROSPHERE) 160-9-4.8 MCG/ACT AERO Inhale 2 puffs into the lungs in the morning and at bedtime. 07/20/23   Hope Almarie ORN, NP   Budeson-Glycopyrrol-Formoterol (BREZTRI  AEROSPHERE) 160-9-4.8 MCG/ACT AERO Inhale 2 puffs into the lungs in the morning and at bedtime. Patient not taking: Reported on 07/10/2024 07/20/23   Hope Almarie ORN, NP  clonazePAM (KLONOPIN) 0.5 MG tablet Take 0.5 mg by mouth 2 (two) times daily as needed. 01/14/22   [provider]  famotidine  (PEPCID ) 20 MG tablet Take 1 tablet (20 mg total) by mouth 2 (two) times daily. Patient not taking: Reported on 07/10/2024 06/24/23   Teresa Shelba SAUNDERS, NP  furosemide  (LASIX ) 40 MG tablet Take 40 mg by mouth 2 (two) times daily. Patient not taking: Reported on 07/10/2024 05/02/23   [provider]  ipratropium (ATROVENT ) 0.06 % nasal spray Place 2 sprays into both nostrils 4 (four) times daily. 07/03/24   Bernardino Ditch, NP  KLOR-CON M20 20 MEQ tablet Take 20 mEq by mouth daily. Patient not taking: Reported on 07/10/2024 04/17/23   [provider]  LINZESS 290 MCG CAPS capsule Take 290 mcg by mouth daily. Patient not taking: Reported on 07/10/2024 04/23/23   [provider]  metoprolol succinate (TOPROL-XL) 50 MG 24 hr tablet Take 50 mg by mouth daily. Patient not taking: Reported on 07/10/2024 02/23/24   [provider]  mometasone  (NASONEX ) 50 MCG/ACT nasal spray Place 2 sprays into the nose daily. Patient not taking: Reported on 07/10/2024 10/12/23   Mortenson, Ashley, MD  nystatin  (MYCOSTATIN ) 100000 UNIT/ML suspension Take 5 mLs (500,000 Units total) by mouth 4 (four) times daily. Swish and swallow x 7-14 days. Retain in mouth as long as possible Patient not taking: Reported on 07/10/2024 12/12/23   Brimage, Vondra, DO  ondansetron  (ZOFRAN -ODT) 4 MG disintegrating tablet Take 1 tablet (4 mg total) by mouth every 8 (eight) hours as needed for nausea or vomiting. Patient not taking: Reported on 07/10/2024 06/24/23   Teresa Shelba SAUNDERS, NP  oxyCODONE (OXYCONTIN) 20 mg 12 hr tablet Take 20 mg by mouth every 12 (twelve) hours.     [provider]  PANCREAZE 16800 units CPEP Take 1 capsule by mouth 3 (three) times daily. Patient not taking: Reported on 07/10/2024 02/01/18   [provider]  pantoprazole  (PROTONIX ) 40 MG tablet Take 40 mg by mouth daily. Patient not taking: Reported on 07/10/2024    [provider]  predniSONE  (DELTASONE ) 1 MG tablet Take 1 mg by mouth every other day. Patient not taking: Reported on 07/10/2024    [provider]  predniSONE  (DELTASONE ) 5 MG tablet Take 1 tablet (5 mg total) by mouth daily with breakfast. 08/18/24   Bernardino Ditch, NP  promethazine -dextromethorphan (PROMETHAZINE -DM) 6.25-15 MG/5ML syrup Take 5 mLs by mouth 4 (four) times daily as needed. Patient not taking: Reported on 07/10/2024 07/03/24   Bernardino Ditch, NP  spironolactone (ALDACTONE) 25 MG tablet Take 25 mg by mouth 2 (two) times daily as needed. Patient not taking: Reported on 07/10/2024 04/25/24   [provider]    Family History Family History  Problem Relation Age of Onset   Lung cancer Mother  Pancreatic disease Father     Social History Social History   Tobacco Use   Smoking status: Former    Types: Cigarettes    Passive exposure: Past   Smokeless tobacco: Never   Tobacco comments:    Patient does not know what year he quit smoking.  Vaping Use   Vaping status: Former  Substance Use Topics   Alcohol use: Not Currently   Drug use: No     Allergies   Gabapentin, Zithromax [azithromycin], and Levaquin  [levofloxacin ]   Review of Systems Review of Systems  Constitutional:  Positive for fatigue. Negative for fever.  HENT:  Positive for congestion, ear pain, postnasal drip, rhinorrhea, sinus pressure, sinus pain and sore throat.   Respiratory:  Positive for cough. Negative for shortness of breath and wheezing.   Cardiovascular:  Negative for chest pain.  Gastrointestinal:  Negative for abdominal pain, diarrhea, nausea and vomiting.  Musculoskeletal:  Negative  for myalgias.  Neurological:  Positive for headaches. Negative for weakness and light-headedness.  Hematological:  Negative for adenopathy.     Physical Exam Triage Vital Signs ED Triage Vitals  Enc Vitals Group     BP      Pulse      Resp      Temp      Temp src      SpO2      Weight      Height      Head Circumference      Peak Flow      Pain Score      Pain Loc      Pain Edu?      Excl. in GC?    No data found.  Updated Vital Signs BP 137/88 (BP Location: Right Arm)   Pulse (!) 111   Temp 98.7 F (37.1 C) (Oral)   Resp 20   Wt 237 lb 10.5 oz (107.8 kg)   SpO2 96%   BMI 32.23 kg/m     Physical Exam Vitals and nursing note reviewed.  Constitutional:      General: He is not in acute distress.    Appearance: Normal appearance. He is well-developed. He is ill-appearing.  HENT:     Head: Normocephalic and atraumatic.     Right Ear: Ear canal and external ear normal. A middle ear effusion is present.     Left Ear: Ear canal and external ear normal. A middle ear effusion is present.     Nose: Congestion present.     Mouth/Throat:     Mouth: Mucous membranes are moist.     Pharynx: Oropharynx is clear. Posterior oropharyngeal erythema present.  Eyes:     General: No scleral icterus.    Conjunctiva/sclera: Conjunctivae normal.  Cardiovascular:     Rate and Rhythm: Regular rhythm. Tachycardia present.     Heart sounds: Normal heart sounds.  Pulmonary:     Effort: Pulmonary effort is normal. No respiratory distress.     Breath sounds: No wheezing.  Musculoskeletal:     Cervical back: Neck supple.  Skin:    General: Skin is warm and dry.     Capillary Refill: Capillary refill takes less than 2 seconds.  Neurological:     General: No focal deficit present.     Mental Status: He is alert. Mental status is at baseline.     Motor: No weakness.     Gait: Gait normal.  Psychiatric:        Mood and Affect: Mood  normal. Mood is not anxious.      UC Treatments  / Results  Labs (all labs ordered are listed, but only abnormal results are displayed) Labs Reviewed - No data to display  EKG   Radiology No results found.  Procedures Procedures (including critical care time)  Medications Ordered in UC Medications - No data to display  Initial Impression / Assessment and Plan / UC Course  I have reviewed the triage vital signs and the nursing notes.  Pertinent labs & imaging results that were available during my care of the patient were reviewed by me and considered in my medical decision making (see chart for details).   54 year old male with history of COPD and allergies presents for 5 day history of nasal congestion, sinus pressure, ear pain/pressure, postnasal drainage, sore throat, headaches and dry cough.  Denies fever, chest pain and SOB. On chronic prednisone  5 mg, Advair  and Breztri . Taking Sudafed.  Pulse 111 bpm. O2 saturation 85% checked by nursing staff. BP 137/88. I re-checked pulse oximetry immediately and left it on patient's finger while he as speaking. O2 stayed at 95-96% and he did not appear short of breath. Speaking in full and complete sentences.  Patient is in no acute distress.  He is ill-appearing but nontoxic.  On exam he does have significant nasal congestion and erythema of posterior pharynx with mild swelling of posterior pharynx.  Clear effusion of bilateral TMs.  Chest is clear. He is tachycardic with regular rhythm.  Suspect sinusitis. Long history of sinusitis and COPD exacerbation. Treating with Augmentin . Advised him to continue home prednisone , inhalers, Sudafed and nasal rinses.  Reviewed return precautions.    Final Clinical Impressions(s) / UC Diagnoses   Final diagnoses:  Acute sinusitis, recurrence not specified, unspecified location  Chronic obstructive pulmonary disease, unspecified COPD type (HCC)     Discharge Instructions      - Continue Sudafed and nasal sprays.  Try Nettie pot rinse. - I sent  Augmentin  to the pharmacy. - Increase rest and fluids. - If breathing worsens use your nebulizers.  Continue with your inhalers as directed.  If you develop a fever, increased shortness of breath, chest pain, weakness go to emergency department.      ED Prescriptions     Medication Sig Dispense Auth. Provider   amoxicillin -clavulanate (AUGMENTIN ) 875-125 MG tablet Take 1 tablet by mouth every 12 (twelve) hours for 10 days. 20 tablet Lorriane Dehart B, PA-C      PDMP not reviewed this encounter.       Arvis Jolan NOVAK, PA-C 10/12/24 [phone]

## 2024-10-12 NOTE — ED Triage Notes (Signed)
 Pt presents c/o sinus pressure x 5 days. Pt states,  I have got sinus pressure in my whole face and my forehead. I get this every year like clockwork. I have tried Sudafed but it barely worked.  Pt denies emesis or any additional sxs.

## 2024-10-12 NOTE — Discharge Instructions (Addendum)
-   Continue Sudafed and nasal sprays.  Try Nettie pot rinse. - I sent Augmentin  to the pharmacy. - Increase rest and fluids. - If breathing worsens use your nebulizers.  Continue with your inhalers as directed.  If you develop a fever, increased shortness of breath, chest pain, weakness go to emergency department.

## 2024-11-27 ENCOUNTER — Ambulatory Visit
Admission: EM | Admit: 2024-11-27 | Discharge: 2024-11-27 | Disposition: A | Discharge status: Home / Self Care | Attending: Family Medicine | Admitting: Family Medicine

## 2024-11-27 DIAGNOSIS — J0141 Acute recurrent pansinusitis: Secondary | ICD-10-CM

## 2024-11-27 MED ORDER — DOXYCYCLINE HYCLATE 100 MG PO CAPS: 100.0000 mg | ORAL_CAPSULE | Freq: Two times a day (BID) | ORAL | 0 refills | Status: AC

## 2024-11-27 NOTE — ED Provider Notes (Signed)
 [facility]    CSN: [id number] Arrival date & time: 11/27/24  1508      History   Chief Complaint Chief Complaint  Patient presents with   Facial Pain    HPI Eugene Blackburn is a 53 y.o. male  presents for evaluation of URI symptoms for sinus pressure/pain for more than 1 month. Patient reports associated symptoms of frontal and maxillary sinus pressure/pain with postnasal drip and purulent drainage. Denies N/V/D, fevers, ear pain, sore throat, body aches, cough, shortness of breath. Patient does note that have a hx of asthma.  Patient was seen October 16 for similar symptoms and was treated with Augmentin  which he states helped but then he developed a cold causing his symptoms to worsen again.  He has been taking Sudafed OTC.  Pt has no other concerns at this time.   HPI  Past Medical History:  Diagnosis Date   Arthritis    Asthma    Chronic idiopathic constipation    Chronic obstruct airways disease (HCC)    Chronic pancreatitis (HCC)    COPD with chronic bronchitis and emphysema (HCC)    Degenerative disorder of muscle    Hearing loss    Hemorrhoids    History of unintended awareness under general anesthesia    Hyperlipidemia    Insomnia    Male erectile dysfunction    Nausea    Pancreas (digestive gland) works poorly    Pre-diabetes    PTSD (post-traumatic stress disorder)    Seasonal allergic rhinitis    Vitamin D deficiency     Patient Active Problem List   Diagnosis Date Noted   COPD with chronic bronchitis and emphysema (HCC) 07/26/2023   Tremor 01/26/2019   Numbness and tingling of both legs 01/26/2019   Muscle disease 12/28/2005   Abdominal pain 12/29/2003    Past Surgical History:  Procedure Laterality Date   CATARACT EXTRACTION W/PHACO Left 08/16/2024   Procedure: PHACOEMULSIFICATION, CATARACT, WITH IOL INSERTION 4.54 00:30.2;  Surgeon: Mittie Gaskin, MD;  Location: [facility];  Service: Ophthalmology;  Laterality: Left;    EYE SURGERY     PANCREAS SURGERY         Home Medications    Prior to Admission medications   Medication Sig Start Date End Date Taking? Authorizing Provider  doxycycline  (VIBRAMYCIN ) 100 MG capsule Take 1 capsule (100 mg total) by mouth 2 (two) times daily. 11/27/24  Yes Phillip Maffei, Jodi R, NP  albuterol  (VENTOLIN  HFA) 108 (90 Base) MCG/ACT inhaler Inhale 2 puffs into the lungs every 4 (four) hours as needed for wheezing or shortness of breath. 07/20/23   Hope Almarie ORN, NP  aluminum -magnesium  hydroxide-simethicone  (MAALOX) 200-200-20 MG/5ML SUSP Take 30 mLs by mouth 4 (four) times daily -  before meals and at bedtime. 06/24/23   Teresa Shelba SAUNDERS, NP  Budeson-Glycopyrrol-Formoterol (BREZTRI  AEROSPHERE) 160-9-4.8 MCG/ACT AERO Inhale 2 puffs into the lungs in the morning and at bedtime. 07/20/23   Hope Almarie ORN, NP  Budeson-Glycopyrrol-Formoterol (BREZTRI  AEROSPHERE) 160-9-4.8 MCG/ACT AERO Inhale 2 puffs into the lungs in the morning and at bedtime. Patient not taking: Reported on 07/10/2024 07/20/23   Hope Almarie ORN, NP  clonazePAM (KLONOPIN) 0.5 MG tablet Take 0.5 mg by mouth 2 (two) times daily as needed. 01/14/22   [provider]  famotidine  (PEPCID ) 20 MG tablet Take 1 tablet (20 mg total) by mouth 2 (two) times daily. Patient not taking: Reported on 07/10/2024 06/24/23   Teresa Shelba SAUNDERS, NP  ferrous sulfate  325 (65 FE) MG tablet Take 325 mg by mouth. 03/02/24   [provider]  furosemide  (LASIX ) 40 MG tablet Take 40 mg by mouth 2 (two) times daily. Patient not taking: Reported on 07/10/2024 05/02/23   [provider]  ipratropium (ATROVENT ) 0.06 % nasal spray Place 2 sprays into both nostrils 4 (four) times daily. 07/03/24   Bernardino Ditch, NP  ketorolac  (ACULAR ) 0.5 % ophthalmic solution Place 1 drop into the left eye 4 (four) times daily. 06/19/24   [provider]  KLOR-CON M20 20 MEQ tablet Take 20 mEq by mouth daily. Patient not taking: Reported on  07/10/2024 04/17/23   [provider]  LINZESS 290 MCG CAPS capsule Take 290 mcg by mouth daily. Patient not taking: Reported on 07/10/2024 04/23/23   [provider]  metoprolol succinate (TOPROL-XL) 50 MG 24 hr tablet Take 50 mg by mouth daily. Patient not taking: Reported on 07/10/2024 02/23/24   [provider]  mometasone  (NASONEX ) 50 MCG/ACT nasal spray Place 2 sprays into the nose daily. Patient not taking: Reported on 07/10/2024 10/12/23   Mortenson, Ashley, MD  nystatin  (MYCOSTATIN ) 100000 UNIT/ML suspension Take 5 mLs (500,000 Units total) by mouth 4 (four) times daily. Swish and swallow x 7-14 days. Retain in mouth as long as possible Patient not taking: Reported on 07/10/2024 12/12/23   Brimage, Vondra, DO  ondansetron  (ZOFRAN -ODT) 4 MG disintegrating tablet Take 1 tablet (4 mg total) by mouth every 8 (eight) hours as needed for nausea or vomiting. Patient not taking: Reported on 07/10/2024 06/24/23   Teresa Shelba SAUNDERS, NP  [facility] ULTRA test strip 1 each 2 (two) times daily. 09/20/24   [provider]  oxyCODONE (OXYCONTIN) 20 mg 12 hr tablet Take 20 mg by mouth every 12 (twelve) hours.    [provider]  PANCREAZE 16800 units CPEP Take 1 capsule by mouth 3 (three) times daily. Patient not taking: Reported on 07/10/2024 02/01/18   [provider]  pantoprazole  (PROTONIX ) 40 MG tablet Take 40 mg by mouth daily. Patient not taking: Reported on 07/10/2024    [provider]  prednisoLONE acetate (PRED FORTE) 1 % ophthalmic suspension 1 drop 4 (four) times daily. 06/19/24   [provider]  predniSONE  (DELTASONE ) 1 MG tablet Take 1 mg by mouth every other day. Patient not taking: Reported on 07/10/2024    [provider]  predniSONE  (DELTASONE ) 5 MG tablet Take 1 tablet (5 mg total) by mouth daily with breakfast. 08/18/24   Bernardino Ditch, NP  promethazine -dextromethorphan (PROMETHAZINE -DM) 6.25-15 MG/5ML syrup Take 5 mLs by  mouth 4 (four) times daily as needed. Patient not taking: Reported on 07/10/2024 07/03/24   Bernardino Ditch, NP  spironolactone (ALDACTONE) 25 MG tablet Take 25 mg by mouth 2 (two) times daily as needed. Patient not taking: Reported on 07/10/2024 04/25/24   [provider]  trimethoprim-polymyxin b (POLYTRIM) ophthalmic solution 1 drop 4 (four) times daily. 06/19/24   [provider]    Family History Family History  Problem Relation Age of Onset   Lung cancer Mother    Pancreatic disease Father     Social History Social History   Tobacco Use   Smoking status: Former    Types: Cigarettes    Passive exposure: Past   Smokeless tobacco: Never   Tobacco comments:    Patient does not know what year he quit smoking.  Vaping Use   Vaping status: Former  Substance Use Topics   Alcohol use: Not  Currently   Drug use: No     Allergies   Gabapentin, Zithromax [azithromycin], and Levaquin  [levofloxacin ]   Review of Systems Review of Systems  HENT:  Positive for congestion, postnasal drip, sinus pressure and sinus pain.      Physical Exam Triage Vital Signs ED Triage Vitals  Encounter Vitals Group     BP 11/27/24 1553 (!) 160/98     Girls Systolic BP Percentile --      Girls Diastolic BP Percentile --      Boys Systolic BP Percentile --      Boys Diastolic BP Percentile --      Pulse Rate 11/27/24 1553 (!) 111     Resp 11/27/24 1553 18     Temp 11/27/24 1553 98.4 F (36.9 C)     Temp Source 11/27/24 1553 Oral     SpO2 11/27/24 1553 95 %     Weight 11/27/24 1552 242 lb (109.8 kg)     Height --      Head Circumference --      Peak Flow --      Pain Score 11/27/24 1552 8     Pain Loc --      Pain Education --      Exclude from Growth Chart --    No data found.  Updated Vital Signs BP (!) 160/98 (BP Location: Left Arm)   Pulse (!) 111   Temp 98.4 F (36.9 C) (Oral)   Resp 18   Wt 242 lb (109.8 kg)   SpO2 95%   BMI 32.82 kg/m   Visual Acuity Right  Eye Distance:   Left Eye Distance:   Bilateral Distance:    Right Eye Near:   Left Eye Near:    Bilateral Near:     Physical Exam Vitals and nursing note reviewed.  Constitutional:      General: He is not in acute distress.    Appearance: Normal appearance. He is not ill-appearing or toxic-appearing.  HENT:     Head: Normocephalic and atraumatic.     Right Ear: Tympanic membrane and ear canal normal.     Left Ear: Tympanic membrane and ear canal normal.     Nose: Congestion present.     Right Turbinates: Swollen and pale.     Left Turbinates: Swollen and pale.     Right Sinus: Maxillary sinus tenderness and frontal sinus tenderness present.     Left Sinus: Maxillary sinus tenderness and frontal sinus tenderness present.     Mouth/Throat:     Mouth: Mucous membranes are moist.     Pharynx: No posterior oropharyngeal erythema.  Eyes:     Pupils: Pupils are equal, round, and reactive to light.  Cardiovascular:     Rate and Rhythm: Regular rhythm. Tachycardia present.     Heart sounds: Normal heart sounds.     Comments: Patient is on Sudafed and chart review shows history of tachycardia Pulmonary:     Effort: Pulmonary effort is normal.     Breath sounds: Normal breath sounds.  Musculoskeletal:     Cervical back: Normal range of motion and neck supple.  Lymphadenopathy:     Cervical: No cervical adenopathy.  Skin:    General: Skin is warm and dry.  Neurological:     General: No focal deficit present.     Mental Status: He is alert and oriented to person, place, and time.  Psychiatric:        Mood and Affect: Mood normal.  Behavior: Behavior normal.      UC Treatments / Results  Labs (all labs ordered are listed, but only abnormal results are displayed) Labs Reviewed - No data to display  EKG   Radiology No results found.  Procedures Procedures (including critical care time)  Medications Ordered in UC Medications - No data to display  Initial  Impression / Assessment and Plan / UC Course  I have reviewed the triage vital signs and the nursing notes.  Pertinent labs & imaging results that were available during my care of the patient were reviewed by me and considered in my medical decision making (see chart for details).     Reviewed exam and symptoms with patient.  Will treat for sinusitis with doxycycline .  He is already on low-dose prednisone  for long COVID.  Continue nasal sprays and nasal rinses as needed.  PCP follow-up if symptoms do not improve.  ER precautions reviewed. Final Clinical Impressions(s) / UC Diagnoses   Final diagnoses:  Acute recurrent pansinusitis     Discharge Instructions      Start doxycycline  daily for 10 days.  Continue your nasal sprays/nasal rinses as needed.  Follow-up with your PCP if your symptoms do not improve.  Please go to the emergency room for any worsening symptoms.  Hope you feel better soon!    ED Prescriptions     Medication Sig Dispense Auth. Provider   doxycycline  (VIBRAMYCIN ) 100 MG capsule Take 1 capsule (100 mg total) by mouth 2 (two) times daily. 20 capsule Gustavia Carie, Jodi R, NP      PDMP not reviewed this encounter.   Loreda Myla SAUNDERS, NP 11/27/24 [phone]

## 2024-11-27 NOTE — Discharge Instructions (Addendum)
 Start doxycycline  daily for 10 days.  Continue your nasal sprays/nasal rinses as needed.  Follow-up with your PCP if your symptoms do not improve.  Please go to the emergency room for any worsening symptoms.  Hope you feel better soon!

## 2024-11-27 NOTE — ED Triage Notes (Signed)
 Pt present facial pain with sinus pressure and headache. Symptoms started over a month ago and he was treated but he has not gotten better.
